# Patient Record
Sex: Female | Born: 1985 | Race: White | Hispanic: No | Marital: Single | Smoking: Never smoker
Health system: Southern US, Community
[De-identification: ages and names within clinical notes are randomized; demographics above are authoritative.]

## PROBLEM LIST (undated history)

## (undated) DIAGNOSIS — T783XXA Angioneurotic edema, initial encounter: Secondary | ICD-10-CM

## (undated) DIAGNOSIS — M5136 Other intervertebral disc degeneration, lumbar region: Secondary | ICD-10-CM

## (undated) DIAGNOSIS — R112 Nausea with vomiting, unspecified: Secondary | ICD-10-CM

## (undated) DIAGNOSIS — D8940 Mast cell activation, unspecified: Secondary | ICD-10-CM

## (undated) DIAGNOSIS — T7840XA Allergy, unspecified, initial encounter: Secondary | ICD-10-CM

## (undated) DIAGNOSIS — M5126 Other intervertebral disc displacement, lumbar region: Secondary | ICD-10-CM

## (undated) DIAGNOSIS — L509 Urticaria, unspecified: Secondary | ICD-10-CM

## (undated) DIAGNOSIS — Q796 Ehlers-Danlos syndrome, unspecified: Secondary | ICD-10-CM

## (undated) DIAGNOSIS — M51369 Other intervertebral disc degeneration, lumbar region without mention of lumbar back pain or lower extremity pain: Secondary | ICD-10-CM

## (undated) DIAGNOSIS — Z9889 Other specified postprocedural states: Secondary | ICD-10-CM

## (undated) HISTORY — DX: Angioneurotic edema, initial encounter: T78.3XXA

## (undated) HISTORY — DX: Ehlers-Danlos syndrome, unspecified: Q79.60

## (undated) HISTORY — DX: Urticaria, unspecified: L50.9

## (undated) HISTORY — DX: Allergy, unspecified, initial encounter: T78.40XA

## (undated) HISTORY — DX: Mast cell activation, unspecified: D89.40

---
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
## 2004-07-02 ENCOUNTER — Ambulatory Visit: Payer: Self-pay | Admitting: Gastroenterology

## 2011-07-15 HISTORY — PX: LEEP: SHX91

## 2015-02-14 ENCOUNTER — Other Ambulatory Visit: Payer: Self-pay | Admitting: Obstetrics & Gynecology

## 2015-02-14 ENCOUNTER — Other Ambulatory Visit (HOSPITAL_COMMUNITY)
Admission: RE | Admit: 2015-02-14 | Discharge: 2015-02-14 | Disposition: A | Discharge status: Home / Self Care | Payer: BLUE CROSS/BLUE SHIELD | Source: Ambulatory Visit | Attending: Obstetrics & Gynecology | Admitting: Obstetrics & Gynecology

## 2015-02-14 DIAGNOSIS — Z1151 Encounter for screening for human papillomavirus (HPV): Secondary | ICD-10-CM | POA: Diagnosis present

## 2015-02-14 DIAGNOSIS — Z01419 Encounter for gynecological examination (general) (routine) without abnormal findings: Secondary | ICD-10-CM | POA: Insufficient documentation

## 2015-02-14 DIAGNOSIS — Z113 Encounter for screening for infections with a predominantly sexual mode of transmission: Secondary | ICD-10-CM | POA: Diagnosis present

## 2015-02-15 LAB — CYTOLOGY - PAP

## 2015-11-16 DIAGNOSIS — Z113 Encounter for screening for infections with a predominantly sexual mode of transmission: Secondary | ICD-10-CM | POA: Diagnosis not present

## 2015-12-19 DIAGNOSIS — Z3202 Encounter for pregnancy test, result negative: Secondary | ICD-10-CM | POA: Diagnosis not present

## 2015-12-19 DIAGNOSIS — Z30430 Encounter for insertion of intrauterine contraceptive device: Secondary | ICD-10-CM | POA: Diagnosis not present

## 2016-02-04 DIAGNOSIS — Z30431 Encounter for routine checking of intrauterine contraceptive device: Secondary | ICD-10-CM | POA: Diagnosis not present

## 2016-03-10 DIAGNOSIS — H6121 Impacted cerumen, right ear: Secondary | ICD-10-CM | POA: Diagnosis not present

## 2016-03-10 DIAGNOSIS — H9311 Tinnitus, right ear: Secondary | ICD-10-CM | POA: Diagnosis not present

## 2016-04-21 DIAGNOSIS — H6993 Unspecified Eustachian tube disorder, bilateral: Secondary | ICD-10-CM | POA: Diagnosis not present

## 2016-04-21 DIAGNOSIS — R42 Dizziness and giddiness: Secondary | ICD-10-CM | POA: Diagnosis not present

## 2016-08-20 DIAGNOSIS — Z7189 Other specified counseling: Secondary | ICD-10-CM | POA: Diagnosis not present

## 2016-08-20 DIAGNOSIS — Z113 Encounter for screening for infections with a predominantly sexual mode of transmission: Secondary | ICD-10-CM | POA: Diagnosis not present

## 2017-02-12 DIAGNOSIS — M5136 Other intervertebral disc degeneration, lumbar region: Secondary | ICD-10-CM | POA: Diagnosis not present

## 2017-02-12 DIAGNOSIS — Z01411 Encounter for gynecological examination (general) (routine) with abnormal findings: Secondary | ICD-10-CM | POA: Diagnosis not present

## 2017-02-12 DIAGNOSIS — M545 Low back pain: Secondary | ICD-10-CM | POA: Diagnosis not present

## 2017-02-12 DIAGNOSIS — M5126 Other intervertebral disc displacement, lumbar region: Secondary | ICD-10-CM | POA: Diagnosis not present

## 2017-02-12 DIAGNOSIS — M5431 Sciatica, right side: Secondary | ICD-10-CM | POA: Diagnosis not present

## 2017-02-13 DIAGNOSIS — G5601 Carpal tunnel syndrome, right upper limb: Secondary | ICD-10-CM | POA: Diagnosis not present

## 2017-02-13 DIAGNOSIS — G5602 Carpal tunnel syndrome, left upper limb: Secondary | ICD-10-CM | POA: Diagnosis not present

## 2017-02-16 DIAGNOSIS — G5602 Carpal tunnel syndrome, left upper limb: Secondary | ICD-10-CM | POA: Diagnosis not present

## 2017-02-16 DIAGNOSIS — G5601 Carpal tunnel syndrome, right upper limb: Secondary | ICD-10-CM | POA: Diagnosis not present

## 2017-02-18 DIAGNOSIS — M5126 Other intervertebral disc displacement, lumbar region: Secondary | ICD-10-CM | POA: Diagnosis not present

## 2017-02-23 DIAGNOSIS — G5602 Carpal tunnel syndrome, left upper limb: Secondary | ICD-10-CM | POA: Diagnosis not present

## 2017-02-23 DIAGNOSIS — G5601 Carpal tunnel syndrome, right upper limb: Secondary | ICD-10-CM | POA: Diagnosis not present

## 2017-03-06 DIAGNOSIS — M545 Low back pain: Secondary | ICD-10-CM | POA: Diagnosis not present

## 2017-03-06 DIAGNOSIS — M5126 Other intervertebral disc displacement, lumbar region: Secondary | ICD-10-CM | POA: Diagnosis not present

## 2017-03-25 DIAGNOSIS — M5126 Other intervertebral disc displacement, lumbar region: Secondary | ICD-10-CM | POA: Diagnosis not present

## 2017-04-06 DIAGNOSIS — R1030 Lower abdominal pain, unspecified: Secondary | ICD-10-CM | POA: Diagnosis not present

## 2017-04-06 DIAGNOSIS — Z113 Encounter for screening for infections with a predominantly sexual mode of transmission: Secondary | ICD-10-CM | POA: Diagnosis not present

## 2017-04-06 DIAGNOSIS — Z975 Presence of (intrauterine) contraceptive device: Secondary | ICD-10-CM | POA: Diagnosis not present

## 2017-04-07 DIAGNOSIS — Z30431 Encounter for routine checking of intrauterine contraceptive device: Secondary | ICD-10-CM | POA: Diagnosis not present

## 2017-04-07 DIAGNOSIS — R102 Pelvic and perineal pain: Secondary | ICD-10-CM | POA: Diagnosis not present

## 2017-04-10 DIAGNOSIS — M5126 Other intervertebral disc displacement, lumbar region: Secondary | ICD-10-CM | POA: Diagnosis not present

## 2017-04-20 DIAGNOSIS — M5126 Other intervertebral disc displacement, lumbar region: Secondary | ICD-10-CM | POA: Diagnosis not present

## 2017-06-09 DIAGNOSIS — Z79899 Other long term (current) drug therapy: Secondary | ICD-10-CM | POA: Diagnosis not present

## 2017-06-09 DIAGNOSIS — L700 Acne vulgaris: Secondary | ICD-10-CM | POA: Diagnosis not present

## 2017-06-09 DIAGNOSIS — L409 Psoriasis, unspecified: Secondary | ICD-10-CM | POA: Diagnosis not present

## 2017-07-15 DIAGNOSIS — L700 Acne vulgaris: Secondary | ICD-10-CM | POA: Diagnosis not present

## 2017-07-15 DIAGNOSIS — Z79899 Other long term (current) drug therapy: Secondary | ICD-10-CM | POA: Diagnosis not present

## 2017-07-20 DIAGNOSIS — Z113 Encounter for screening for infections with a predominantly sexual mode of transmission: Secondary | ICD-10-CM | POA: Diagnosis not present

## 2017-08-17 DIAGNOSIS — L700 Acne vulgaris: Secondary | ICD-10-CM | POA: Diagnosis not present

## 2017-08-17 DIAGNOSIS — Z79899 Other long term (current) drug therapy: Secondary | ICD-10-CM | POA: Diagnosis not present

## 2017-09-04 DIAGNOSIS — M545 Low back pain: Secondary | ICD-10-CM | POA: Diagnosis not present

## 2017-09-11 DIAGNOSIS — M545 Low back pain: Secondary | ICD-10-CM | POA: Diagnosis not present

## 2017-10-02 DIAGNOSIS — M545 Low back pain: Secondary | ICD-10-CM | POA: Diagnosis not present

## 2017-10-16 DIAGNOSIS — T8389XA Other specified complication of genitourinary prosthetic devices, implants and grafts, initial encounter: Secondary | ICD-10-CM | POA: Diagnosis not present

## 2017-10-16 DIAGNOSIS — Z30432 Encounter for removal of intrauterine contraceptive device: Secondary | ICD-10-CM | POA: Diagnosis not present

## 2017-10-16 DIAGNOSIS — R102 Pelvic and perineal pain: Secondary | ICD-10-CM | POA: Diagnosis not present

## 2017-10-16 DIAGNOSIS — Z975 Presence of (intrauterine) contraceptive device: Secondary | ICD-10-CM | POA: Diagnosis not present

## 2017-10-16 DIAGNOSIS — N760 Acute vaginitis: Secondary | ICD-10-CM | POA: Diagnosis not present

## 2017-10-23 DIAGNOSIS — Z30432 Encounter for removal of intrauterine contraceptive device: Secondary | ICD-10-CM | POA: Diagnosis not present

## 2017-10-27 ENCOUNTER — Encounter (HOSPITAL_COMMUNITY): Payer: Self-pay | Admitting: *Deleted

## 2017-10-27 ENCOUNTER — Other Ambulatory Visit: Payer: Self-pay

## 2017-11-06 ENCOUNTER — Ambulatory Visit (HOSPITAL_COMMUNITY): Payer: BLUE CROSS/BLUE SHIELD | Admitting: Anesthesiology

## 2017-11-06 ENCOUNTER — Ambulatory Visit (HOSPITAL_COMMUNITY)
Admission: RE | Admit: 2017-11-06 | Discharge: 2017-11-06 | Disposition: A | Discharge status: Home / Self Care | Payer: BLUE CROSS/BLUE SHIELD | Source: Ambulatory Visit | Attending: Obstetrics & Gynecology | Admitting: Obstetrics & Gynecology

## 2017-11-06 ENCOUNTER — Encounter (HOSPITAL_COMMUNITY)
Admission: RE | Disposition: A | Discharge status: Home / Self Care | Payer: Self-pay | Source: Ambulatory Visit | Attending: Obstetrics & Gynecology

## 2017-11-06 ENCOUNTER — Encounter (HOSPITAL_COMMUNITY): Payer: Self-pay

## 2017-11-06 ENCOUNTER — Other Ambulatory Visit: Payer: Self-pay

## 2017-11-06 DIAGNOSIS — M542 Cervicalgia: Secondary | ICD-10-CM | POA: Diagnosis not present

## 2017-11-06 DIAGNOSIS — Y768 Miscellaneous obstetric and gynecological devices associated with adverse incidents, not elsewhere classified: Secondary | ICD-10-CM | POA: Diagnosis not present

## 2017-11-06 DIAGNOSIS — T8389XD Other specified complication of genitourinary prosthetic devices, implants and grafts, subsequent encounter: Secondary | ICD-10-CM | POA: Diagnosis not present

## 2017-11-06 DIAGNOSIS — T8389XA Other specified complication of genitourinary prosthetic devices, implants and grafts, initial encounter: Secondary | ICD-10-CM | POA: Insufficient documentation

## 2017-11-06 DIAGNOSIS — Z30432 Encounter for removal of intrauterine contraceptive device: Secondary | ICD-10-CM | POA: Diagnosis not present

## 2017-11-06 HISTORY — DX: Other intervertebral disc degeneration, lumbar region: M51.36

## 2017-11-06 HISTORY — DX: Other specified postprocedural states: R11.2

## 2017-11-06 HISTORY — PX: HYSTEROSCOPY: SHX211

## 2017-11-06 HISTORY — DX: Other specified postprocedural states: Z98.890

## 2017-11-06 HISTORY — DX: Other intervertebral disc displacement, lumbar region: M51.26

## 2017-11-06 HISTORY — DX: Other intervertebral disc degeneration, lumbar region without mention of lumbar back pain or lower extremity pain: M51.369

## 2017-11-06 HISTORY — DX: Nausea with vomiting, unspecified: R11.2

## 2017-11-06 HISTORY — PX: IUD REMOVAL: SHX5392

## 2017-11-06 LAB — CBC
HEMATOCRIT: 42.8 % (ref 36.0–46.0)
Hemoglobin: 14.7 g/dL (ref 12.0–15.0)
MCH: 31.8 pg (ref 26.0–34.0)
MCHC: 34.3 g/dL (ref 30.0–36.0)
MCV: 92.6 fL (ref 78.0–100.0)
Platelets: 278 10*3/uL (ref 150–400)
RBC: 4.62 MIL/uL (ref 3.87–5.11)
RDW: 13 % (ref 11.5–15.5)
WBC: 8 10*3/uL (ref 4.0–10.5)

## 2017-11-06 LAB — COMPREHENSIVE METABOLIC PANEL
ALT: 15 U/L (ref 14–54)
ANION GAP: 11 (ref 5–15)
AST: 21 U/L (ref 15–41)
Albumin: 4.5 g/dL (ref 3.5–5.0)
Alkaline Phosphatase: 50 U/L (ref 38–126)
BILIRUBIN TOTAL: 1.7 mg/dL — AB (ref 0.3–1.2)
BUN: 9 mg/dL (ref 6–20)
CO2: 23 mmol/L (ref 22–32)
Calcium: 9.2 mg/dL (ref 8.9–10.3)
Chloride: 107 mmol/L (ref 101–111)
Creatinine, Ser: 0.58 mg/dL (ref 0.44–1.00)
GFR calc Af Amer: >60 mL/min (ref >60)
Glucose, Bld: 97 mg/dL (ref 65–99)
POTASSIUM: 4.4 mmol/L (ref 3.5–5.1)
Sodium: 141 mmol/L (ref 135–145)
TOTAL PROTEIN: 7.6 g/dL (ref 6.5–8.1)

## 2017-11-06 LAB — PREGNANCY, URINE: Preg Test, Ur: NEGATIVE

## 2017-11-06 SURGERY — HYSTEROSCOPY
Anesthesia: General

## 2017-11-06 MED ORDER — PROPOFOL 10 MG/ML IV BOLUS
INTRAVENOUS | Status: DC | PRN
  Administered 2017-11-06: 170 mg via INTRAVENOUS

## 2017-11-06 MED ORDER — ONDANSETRON HCL 4 MG/2ML IJ SOLN
INTRAMUSCULAR | Status: AC
  Filled 2017-11-06: qty 2

## 2017-11-06 MED ORDER — DEXAMETHASONE SODIUM PHOSPHATE 10 MG/ML IJ SOLN
INTRAMUSCULAR | Status: DC | PRN
  Administered 2017-11-06: 10 mg via INTRAVENOUS

## 2017-11-06 MED ORDER — SCOPOLAMINE 1 MG/3DAYS TD PT72
1.0000 | MEDICATED_PATCH | Freq: Once | TRANSDERMAL | Status: DC
  Administered 2017-11-06: 1.5 mg via TRANSDERMAL

## 2017-11-06 MED ORDER — FENTANYL CITRATE (PF) 100 MCG/2ML IJ SOLN
INTRAMUSCULAR | Status: AC
Start: 2017-11-06 — End: ?
  Filled 2017-11-06: qty 2

## 2017-11-06 MED ORDER — ONDANSETRON HCL 4 MG/2ML IJ SOLN
INTRAMUSCULAR | Status: DC | PRN
  Administered 2017-11-06: 4 mg via INTRAVENOUS

## 2017-11-06 MED ORDER — LIDOCAINE 2% (20 MG/ML) 5 ML SYRINGE
INTRAMUSCULAR | Status: DC | PRN
  Administered 2017-11-06: 60 mg via INTRAVENOUS

## 2017-11-06 MED ORDER — PROPOFOL 10 MG/ML IV BOLUS
INTRAVENOUS | Status: AC
  Filled 2017-11-06: qty 20

## 2017-11-06 MED ORDER — ACETAMINOPHEN 160 MG/5ML PO SOLN
ORAL | Status: AC
  Filled 2017-11-06: qty 40.6

## 2017-11-06 MED ORDER — DEXAMETHASONE SODIUM PHOSPHATE 10 MG/ML IJ SOLN
INTRAMUSCULAR | Status: AC
Start: 2017-11-06 — End: ?
  Filled 2017-11-06: qty 1

## 2017-11-06 MED ORDER — SCOPOLAMINE 1 MG/3DAYS TD PT72
MEDICATED_PATCH | TRANSDERMAL | Status: AC
  Filled 2017-11-06: qty 1

## 2017-11-06 MED ORDER — FENTANYL CITRATE (PF) 100 MCG/2ML IJ SOLN: 25.0000 ug | INTRAMUSCULAR | Status: DC | PRN

## 2017-11-06 MED ORDER — LACTATED RINGERS IV SOLN: INTRAVENOUS | Status: DC

## 2017-11-06 MED ORDER — MIDAZOLAM HCL 2 MG/2ML IJ SOLN
INTRAMUSCULAR | Status: AC
  Filled 2017-11-06: qty 2

## 2017-11-06 MED ORDER — DEXAMETHASONE SODIUM PHOSPHATE 10 MG/ML IJ SOLN
INTRAMUSCULAR | Status: AC
  Filled 2017-11-06: qty 2

## 2017-11-06 MED ORDER — KETOROLAC TROMETHAMINE 30 MG/ML IJ SOLN
INTRAMUSCULAR | Status: DC | PRN
  Administered 2017-11-06: 30 mg via INTRAVENOUS

## 2017-11-06 MED ORDER — ACETAMINOPHEN 160 MG/5ML PO SOLN
975.0000 mg | Freq: Once | ORAL | Status: AC
  Administered 2017-11-06: 975 mg via ORAL

## 2017-11-06 MED ORDER — LIDOCAINE HCL (CARDIAC) PF 100 MG/5ML IV SOSY
PREFILLED_SYRINGE | INTRAVENOUS | Status: AC
  Filled 2017-11-06: qty 5

## 2017-11-06 MED ORDER — MIDAZOLAM HCL 5 MG/5ML IJ SOLN
INTRAMUSCULAR | Status: DC | PRN
  Administered 2017-11-06: 2 mg via INTRAVENOUS

## 2017-11-06 MED ORDER — LACTATED RINGERS IV SOLN
INTRAVENOUS | Status: DC
  Administered 2017-11-06: 125 mL/h via INTRAVENOUS

## 2017-11-06 MED ORDER — FENTANYL CITRATE (PF) 100 MCG/2ML IJ SOLN
INTRAMUSCULAR | Status: DC | PRN
  Administered 2017-11-06 (×2): 50 ug via INTRAVENOUS

## 2017-11-06 MED ORDER — ROCURONIUM BROMIDE 100 MG/10ML IV SOLN
INTRAVENOUS | Status: AC
  Filled 2017-11-06: qty 2

## 2017-11-06 MED ORDER — KETOROLAC TROMETHAMINE 30 MG/ML IJ SOLN
INTRAMUSCULAR | Status: AC
  Filled 2017-11-06: qty 1

## 2017-11-06 SURGICAL SUPPLY — 17 items
BRIEF STRETCH FOR OB PAD XXL (UNDERPADS AND DIAPERS) ×2 IMPLANT
CANISTER AQUILEX FLUID KIT (CANNISTER) ×2 IMPLANT
CATH ROBINSON RED A/P 16FR (CATHETERS) IMPLANT
DILATOR CANAL MILEX (MISCELLANEOUS) IMPLANT
GLOVE BIOGEL PI IND STRL 6.5 (GLOVE) ×1 IMPLANT
GLOVE BIOGEL PI IND STRL 7.0 (GLOVE) ×1 IMPLANT
GLOVE BIOGEL PI INDICATOR 6.5 (GLOVE) ×1
GLOVE BIOGEL PI INDICATOR 7.0 (GLOVE) ×1
GLOVE ECLIPSE 6.5 STRL STRAW (GLOVE) ×2 IMPLANT
GOWN STRL REUS W/TWL LRG LVL3 (GOWN DISPOSABLE) ×4 IMPLANT
PACK VAGINAL MINOR WOMEN LF (CUSTOM PROCEDURE TRAY) ×2 IMPLANT
PAD OB MATERNITY 4.3X12.25 (PERSONAL CARE ITEMS) ×2 IMPLANT
PAD PREP 24X48 CUFFED NSTRL (MISCELLANEOUS) ×2 IMPLANT
SLEEVE SCD COMPRESS KNEE MED (MISCELLANEOUS) ×2 IMPLANT
TOWEL OR 17X24 6PK STRL BLUE (TOWEL DISPOSABLE) ×4 IMPLANT
TUBING AQUILEX INFLOW (TUBING) ×2 IMPLANT
TUBING AQUILEX OUTFLOW (TUBING) ×2 IMPLANT

## 2017-11-06 NOTE — Anesthesia Preprocedure Evaluation (Addendum)
Anesthesia Evaluation  Patient identified by MRN, date of birth, ID band Patient awake    Reviewed: Allergy & Precautions, H&P , Patient's Chart, lab work & pertinent test results, reviewed documented beta blocker date and time   Airway Mallampati: II  TM Distance: >3 FB Neck ROM: full    Dental no notable dental hx.    Pulmonary    Pulmonary exam normal breath sounds clear to auscultation       Cardiovascular  Rhythm:regular Rate:Normal     Neuro/Psych    GI/Hepatic   Endo/Other    Renal/GU      Musculoskeletal   Abdominal   Peds  Hematology   Anesthesia Other Findings   Reproductive/Obstetrics                             Anesthesia Physical Anesthesia Plan  ASA: II  Anesthesia Plan: General   Post-op Pain Management:    Induction: Intravenous  PONV Risk Score and Plan: 2 and Treatment may vary due to age or medical condition, Ondansetron, Dexamethasone and Scopolamine patch - Pre-op  Airway Management Planned: LMA  Additional Equipment:   Intra-op Plan:   Post-operative Plan:   Informed Consent: I have reviewed the patients History and Physical, chart, labs and discussed the procedure including the risks, benefits and alternatives for the proposed anesthesia with the patient or authorized representative who has indicated his/her understanding and acceptance.   Dental Advisory Given  Plan Discussed with: CRNA and Surgeon  Anesthesia Plan Comments: ( )        Anesthesia Quick Evaluation

## 2017-11-06 NOTE — Anesthesia Postprocedure Evaluation (Signed)
Anesthesia Post Note  Patient: Betty Lewis  Procedure(s) Performed: HYSTEROSCOPY (N/A ) INTRAUTERINE DEVICE (IUD) REMOVAL (N/A )     Patient location during evaluation: PACU Anesthesia Type: General Level of consciousness: awake and alert Pain management: pain level controlled Vital Signs Assessment: post-procedure vital signs reviewed and stable Respiratory status: spontaneous breathing, nonlabored ventilation, respiratory function stable and patient connected to nasal cannula oxygen Cardiovascular status: blood pressure returned to baseline and stable Postop Assessment: no apparent nausea or vomiting Anesthetic complications: no    Last Vitals:  Vitals:   11/06/17 1330 11/06/17 1415  BP: (!) 99/51 (!) 107/55  Pulse: 64 61  Resp: (!) 21 18  Temp:    SpO2: 99% 100%    Last Pain:  Vitals:   11/06/17 1415  TempSrc:   PainSc: 0-No pain   Pain Goal: Patients Stated Pain Goal: 5 (11/06/17 1107)               Lyndle Herrlich EDWARD

## 2017-11-06 NOTE — Transfer of Care (Signed)
Immediate Anesthesia Transfer of Care Note  Patient: Betty Lewis  Procedure(s) Performed: HYSTEROSCOPY (N/A ) INTRAUTERINE DEVICE (IUD) REMOVAL (N/A )  Patient Location: PACU  Anesthesia Type:General  Level of Consciousness: awake, alert  and oriented  Airway & Oxygen Therapy: Patient Spontanous Breathing and Patient connected to nasal cannula oxygen  Post-op Assessment: Report given to RN and Post -op Vital signs reviewed and stable  Post vital signs: Reviewed and stable  Last Vitals:  Vitals Value Taken Time  BP    Temp    Pulse 95 11/06/2017 12:52 PM  Resp 20 11/06/2017 12:52 PM  SpO2 99 % 11/06/2017 12:52 PM  Vitals shown include unvalidated device data.  Last Pain:  Vitals:   11/06/17 1107  TempSrc: Oral  PainSc: 7       Patients Stated Pain Goal: 5 (63/78/58 8502)  Complications: No apparent anesthesia complications

## 2017-11-06 NOTE — Interval H&P Note (Signed)
History and Physical Interval Note:  11/06/2017 12:11 PM  Betty Lewis  has presented today for surgery, with the diagnosis of Z30.432 IUD Removal  The various methods of treatment have been discussed with the patient and family. After consideration of risks, benefits and other options for treatment, the patient has consented to  Procedure(s): HYSTEROSCOPY (N/A) INTRAUTERINE DEVICE (IUD) REMOVAL (N/A) as a surgical intervention .  The patient's history has been reviewed, patient examined, no change in status, stable for surgery.  I have reviewed the patient's chart and labs.  Questions were answered to the patient's satisfaction.     Annalee Genta

## 2017-11-06 NOTE — Anesthesia Procedure Notes (Signed)
Procedure Name: LMA Insertion Date/Time: 11/06/2017 12:26 PM Performed by: Talbot Grumbling, CRNA Pre-anesthesia Checklist: Patient identified, Emergency Drugs available, Suction available and Patient being monitored Patient Re-evaluated:Patient Re-evaluated prior to induction Oxygen Delivery Method: Circle system utilized Preoxygenation: Pre-oxygenation with 100% oxygen Induction Type: IV induction Ventilation: Mask ventilation without difficulty LMA: LMA inserted LMA Size: 4.0 Number of attempts: 1 Placement Confirmation: positive ETCO2 and breath sounds checked- equal and bilateral Tube secured with: Tape Dental Injury: Teeth and Oropharynx as per pre-operative assessment

## 2017-11-06 NOTE — H&P (Signed)
32yo G0 who presents for IUD removal by hysteroscopy.  Pt was seen in office removal was attempted under US guidance, but could not be completed.   In review, over the past year she has noted several side effects including frequent yeast infections (4 this past year), pelvic pain, worsening of skin and other changes. She was seen for this issue by Dr. Clayton Bibles in Sept and the [place] confirmed proper location. While she loves not having a period, due to these other concerns, she wishes to have it removed. Device initially placed on 12/19/15.        Medical History:         Gyn History:  Sexual activity currently sexually active.  Periods : irregular.  Birth control Mirena IUD.  Last pap smear date 02/14/15 - WNL negative HPV.  Denies H/O Last mammogram date.  Abnormal pap smear assessed with colposcopy, treated with LEEP.  STD HPV.        OB History:  Never been pregnant per patient.        Surgical History: Tonsillectomy 2005, LEEP 2014.        Family History: Father: alive. Mother: alive, diagnosed with Diabetes, Hypertension. Paternal Grand Mother: Breast cancer.        Social History:  General:  Tobacco use  cigarettes: Never smoked Tobacco history last updated 10/16/2017 no EXPOSURE TO PASSIVE SMOKE.  Alcohol: yes, social.  no Recreational drug use.  Exercise: yes, regularly.  Marital Status: single.  Children: none.  OCCUPATION: employed, Orthoptist.        Medications: Taking Mirena (52 MG)(Levonorgestrel) 20 MCG/24HR Intrauterine Device Intrauterine , Taking Ibuprofen 600 MG Tablet 1 tablet Orally every 6 hrs x 24 hours prior to menses then q 6 hours prn pain, Discontinued Cytotec(MiSOPROStol) 200 MCG Tablet 1 tablet vaginal night before and in the morning of appointment, Medication List reviewed and reconciled with the patient       Allergies: Codeine Sulfate.       Objective:     Examination:  General Examination:  CONSTITUTIONAL: well developed, well  nourished.  SKIN: warm and dry, no rashes.  NECK: supple, normal appearance.  LUNGS: regular breathing rate and effort.  FEMALE GENITOURINARY: normal external genitalia, labia - unremarkable, vagina - pink moist mucosa, no lesions or abnormal discharge, cervix - no discharge or lesions or CMT.  MUSCULOSKELETAL no calf tenderness bilaterally.  EXTREMITIES: no edema present.  PSYCH: appropriate mood and affect.     A/P: 32yo G0 who presents for hysteroscopy and IUD removal -NPO -LR @ 125cc/hr -SCDs to OR -Risk/benefit reviewed with patient including but not limited to risk of bleeding, infection, uterine perforation or inability to remove device.  Pt aware and wishes to proceed  Janyth Pupa, DO [phone] (cell) [phone] (office)

## 2017-11-06 NOTE — Discharge Instructions (Addendum)
DISCHARGE INSTRUCTIONS: D&C / D&E The following instructions have been prepared to help you care for yourself upon your return home.   Personal hygiene:  Use sanitary pads for vaginal drainage, not tampons.  Shower the day after your procedure.  NO tub baths, pools or Jacuzzis for 2-3 weeks.  Wipe front to back after using the bathroom.  Activity and limitations:  Do NOT drive or operate any equipment for 24 hours. The effects of anesthesia are still present and drowsiness may result.  Do NOT rest in bed all day.  Walking is encouraged.  Walk up and down stairs slowly.  You may resume your normal activity in one to two days or as indicated by your physician.  Sexual activity: NO intercourse for at least 2 weeks after the procedure, or as indicated by your physician.  Diet: Eat a light meal as desired this evening. You may resume your usual diet tomorrow.  Return to work: You may resume your work activities in one to two days or as indicated by your doctor.  What to expect after your surgery: Expect to have vaginal bleeding/discharge for 2-3 days and spotting for up to 10 days. It is not unusual to have soreness for up to 1-2 weeks. You may have a slight burning sensation when you urinate for the first day. Mild cramps may continue for a couple of days. You may have a regular period in 2-6 weeks.  Call your doctor for any of the following:  Excessive vaginal bleeding, saturating and changing one pad every hour.  Inability to urinate 6 hours after discharge from hospital.  Pain not relieved by pain medication.  Fever of 100.4 F or greater.  Unusual vaginal discharge or odor.   Call for an appointment:    Patients signature: ______________________  Nurses signature ________________________  Support person's signature_______________________   HOME INSTRUCTIONS  Please note any unusual or excessive bleeding, pain, swelling. Mild dizziness or drowsiness are  normal for about 24 hours after surgery.   Shower when comfortable  Restrictions: No driving for 24 hours or while taking pain medications.  Activity:  No heavy lifting (> 10 lbs), nothing in vagina (no tampons, douching, or intercourse) x 1 weeks; no tub baths for 1 weeks Vaginal spotting is expected but if your bleeding is heavy, period like,  please call the office   Diet:  You may return to your regular diet.  Do not eat large meals.  Eat small frequent meals throughout the day.  Continue to drink a good amount of water at least 6-8 glasses of water per day, hydration is very important for the healing process.  Pain Management: Take Motrin and/or tylenol as prescribed/needed for pain.  Always take prescription pain medication with food, it may cause constipation, increase fluids and fiber and you may want to take an over-the-counter stool softener like Colace as needed up to 2x a day.    Alcohol -- Avoid for 24 hours and while taking pain medications.  Nausea: Take sips of ginger ale or soda  Fever -- Call physician if temperature over 101 degrees  Follow up:  If you do not already have a follow up appointment scheduled, please call the office at [phone].  If you experience fever (a temperature greater than 100.4), pain unrelieved by pain medication, shortness of breath, swelling of a single leg, or any other symptoms which are concerning to you please the office immediately.

## 2017-11-06 NOTE — Op Note (Signed)
Operative Report  PreOp: IUD migration PostOp: same Procedure:  Hysteroscopy, IUD removal Surgeon: Dr. Janyth Pupa Anesthesia: General Complications:none EBL: Minimal  Findings:7cm anteverted uterus, both ostia visualized.  IUD in proper location Specimens: none  Procedure: The patient was taken to the operating room where she underwent general anesthesia without difficulty. The patient was placed in a low lithotomy position using Allen stirrups. The patient was examined with the findings as noted above.  She was then prepped and draped in the normal sterile fashion. A sterile speculum was inserted into the vagina. A single tooth tenaculum was placed on the anterior lip of the cervix. The uterus was then sounded to 7cm. The endocervical canal was then serially dilated to 14French using Hank dilators.  The diagnostic hysteroscope was then inserted without difficulty and noted to have the findings as listed above. The strings were visualized and the device was removed without complications.  The hysteroscope was reinserted, no uterine perforation, no abnormalities seen. Hemostasis was observed at the cervical site.  The patient was repositioned to the supine position. The patient tolerated the procedure without any complications and taken to recovery in stable condition.   Janyth Pupa, DO [phone] (pager) [phone] (office)

## 2017-11-07 ENCOUNTER — Encounter (HOSPITAL_COMMUNITY): Payer: Self-pay | Admitting: Obstetrics & Gynecology

## 2017-11-25 DIAGNOSIS — M542 Cervicalgia: Secondary | ICD-10-CM | POA: Diagnosis not present

## 2017-11-25 DIAGNOSIS — M5412 Radiculopathy, cervical region: Secondary | ICD-10-CM | POA: Diagnosis not present

## 2017-11-25 DIAGNOSIS — M545 Low back pain: Secondary | ICD-10-CM | POA: Diagnosis not present

## 2018-01-25 DIAGNOSIS — B373 Candidiasis of vulva and vagina: Secondary | ICD-10-CM | POA: Diagnosis not present

## 2018-01-25 DIAGNOSIS — N760 Acute vaginitis: Secondary | ICD-10-CM | POA: Diagnosis not present

## 2018-02-01 DIAGNOSIS — L409 Psoriasis, unspecified: Secondary | ICD-10-CM | POA: Diagnosis not present

## 2018-02-04 DIAGNOSIS — M5412 Radiculopathy, cervical region: Secondary | ICD-10-CM | POA: Insufficient documentation

## 2018-02-16 DIAGNOSIS — Z01411 Encounter for gynecological examination (general) (routine) with abnormal findings: Secondary | ICD-10-CM | POA: Diagnosis not present

## 2018-03-11 DIAGNOSIS — M25552 Pain in left hip: Secondary | ICD-10-CM | POA: Diagnosis not present

## 2018-03-11 DIAGNOSIS — M5416 Radiculopathy, lumbar region: Secondary | ICD-10-CM | POA: Diagnosis not present

## 2018-03-25 DIAGNOSIS — M5416 Radiculopathy, lumbar region: Secondary | ICD-10-CM | POA: Diagnosis not present

## 2018-03-31 DIAGNOSIS — M5416 Radiculopathy, lumbar region: Secondary | ICD-10-CM | POA: Diagnosis not present

## 2018-04-09 DIAGNOSIS — M5416 Radiculopathy, lumbar region: Secondary | ICD-10-CM | POA: Diagnosis not present

## 2018-04-21 DIAGNOSIS — M5416 Radiculopathy, lumbar region: Secondary | ICD-10-CM | POA: Diagnosis not present

## 2018-04-29 DIAGNOSIS — L408 Other psoriasis: Secondary | ICD-10-CM | POA: Diagnosis not present

## 2018-04-29 DIAGNOSIS — Z23 Encounter for immunization: Secondary | ICD-10-CM | POA: Diagnosis not present

## 2018-04-29 DIAGNOSIS — Z411 Encounter for cosmetic surgery: Secondary | ICD-10-CM | POA: Diagnosis not present

## 2018-05-07 DIAGNOSIS — M5416 Radiculopathy, lumbar region: Secondary | ICD-10-CM | POA: Diagnosis not present

## 2018-05-07 DIAGNOSIS — M545 Low back pain: Secondary | ICD-10-CM | POA: Diagnosis not present

## 2018-06-18 DIAGNOSIS — N760 Acute vaginitis: Secondary | ICD-10-CM | POA: Diagnosis not present

## 2018-06-18 DIAGNOSIS — R300 Dysuria: Secondary | ICD-10-CM | POA: Diagnosis not present

## 2018-08-11 DIAGNOSIS — M25511 Pain in right shoulder: Secondary | ICD-10-CM | POA: Insufficient documentation

## 2018-08-13 DIAGNOSIS — F4322 Adjustment disorder with anxiety: Secondary | ICD-10-CM | POA: Diagnosis not present

## 2018-08-31 DIAGNOSIS — M2550 Pain in unspecified joint: Secondary | ICD-10-CM | POA: Diagnosis not present

## 2018-08-31 DIAGNOSIS — R634 Abnormal weight loss: Secondary | ICD-10-CM | POA: Diagnosis not present

## 2018-09-04 DIAGNOSIS — F4322 Adjustment disorder with anxiety: Secondary | ICD-10-CM | POA: Diagnosis not present

## 2018-09-07 DIAGNOSIS — M7062 Trochanteric bursitis, left hip: Secondary | ICD-10-CM | POA: Diagnosis not present

## 2018-09-17 DIAGNOSIS — F4322 Adjustment disorder with anxiety: Secondary | ICD-10-CM | POA: Diagnosis not present

## 2018-10-02 DIAGNOSIS — F4322 Adjustment disorder with anxiety: Secondary | ICD-10-CM | POA: Diagnosis not present

## 2018-10-15 DIAGNOSIS — F4322 Adjustment disorder with anxiety: Secondary | ICD-10-CM | POA: Diagnosis not present

## 2018-10-19 DIAGNOSIS — M7062 Trochanteric bursitis, left hip: Secondary | ICD-10-CM | POA: Diagnosis not present

## 2018-10-19 DIAGNOSIS — M25511 Pain in right shoulder: Secondary | ICD-10-CM | POA: Diagnosis not present

## 2018-11-02 DIAGNOSIS — M25511 Pain in right shoulder: Secondary | ICD-10-CM | POA: Diagnosis not present

## 2018-11-02 DIAGNOSIS — M25552 Pain in left hip: Secondary | ICD-10-CM | POA: Diagnosis not present

## 2018-11-04 DIAGNOSIS — M7062 Trochanteric bursitis, left hip: Secondary | ICD-10-CM | POA: Insufficient documentation

## 2018-11-04 DIAGNOSIS — F4322 Adjustment disorder with anxiety: Secondary | ICD-10-CM | POA: Diagnosis not present

## 2018-11-05 DIAGNOSIS — S43431A Superior glenoid labrum lesion of right shoulder, initial encounter: Secondary | ICD-10-CM | POA: Diagnosis not present

## 2018-11-05 DIAGNOSIS — M24159 Other articular cartilage disorders, unspecified hip: Secondary | ICD-10-CM | POA: Diagnosis not present

## 2018-11-05 DIAGNOSIS — M25511 Pain in right shoulder: Secondary | ICD-10-CM | POA: Diagnosis not present

## 2018-11-05 DIAGNOSIS — M7062 Trochanteric bursitis, left hip: Secondary | ICD-10-CM | POA: Diagnosis not present

## 2018-11-16 DIAGNOSIS — M25552 Pain in left hip: Secondary | ICD-10-CM | POA: Insufficient documentation

## 2018-11-25 DIAGNOSIS — F4322 Adjustment disorder with anxiety: Secondary | ICD-10-CM | POA: Diagnosis not present

## 2018-12-13 DIAGNOSIS — L409 Psoriasis, unspecified: Secondary | ICD-10-CM | POA: Diagnosis not present

## 2018-12-16 DIAGNOSIS — F4322 Adjustment disorder with anxiety: Secondary | ICD-10-CM | POA: Diagnosis not present

## 2018-12-17 DIAGNOSIS — M25552 Pain in left hip: Secondary | ICD-10-CM | POA: Diagnosis not present

## 2018-12-17 DIAGNOSIS — S43431A Superior glenoid labrum lesion of right shoulder, initial encounter: Secondary | ICD-10-CM | POA: Diagnosis not present

## 2018-12-17 DIAGNOSIS — M7062 Trochanteric bursitis, left hip: Secondary | ICD-10-CM | POA: Diagnosis not present

## 2018-12-27 DIAGNOSIS — M25511 Pain in right shoulder: Secondary | ICD-10-CM | POA: Diagnosis not present

## 2018-12-27 DIAGNOSIS — M25552 Pain in left hip: Secondary | ICD-10-CM | POA: Diagnosis not present

## 2019-01-06 DIAGNOSIS — F4322 Adjustment disorder with anxiety: Secondary | ICD-10-CM | POA: Diagnosis not present

## 2019-01-27 DIAGNOSIS — F4322 Adjustment disorder with anxiety: Secondary | ICD-10-CM | POA: Diagnosis not present

## 2019-02-04 DIAGNOSIS — M25511 Pain in right shoulder: Secondary | ICD-10-CM | POA: Diagnosis not present

## 2019-02-11 DIAGNOSIS — M25511 Pain in right shoulder: Secondary | ICD-10-CM | POA: Diagnosis not present

## 2019-03-01 DIAGNOSIS — L659 Nonscarring hair loss, unspecified: Secondary | ICD-10-CM | POA: Diagnosis not present

## 2019-03-01 DIAGNOSIS — M25552 Pain in left hip: Secondary | ICD-10-CM | POA: Diagnosis not present

## 2019-03-01 DIAGNOSIS — Z01411 Encounter for gynecological examination (general) (routine) with abnormal findings: Secondary | ICD-10-CM | POA: Diagnosis not present

## 2019-03-01 DIAGNOSIS — M25511 Pain in right shoulder: Secondary | ICD-10-CM | POA: Diagnosis not present

## 2019-03-01 DIAGNOSIS — N939 Abnormal uterine and vaginal bleeding, unspecified: Secondary | ICD-10-CM | POA: Diagnosis not present

## 2019-03-09 DIAGNOSIS — M25511 Pain in right shoulder: Secondary | ICD-10-CM | POA: Diagnosis not present

## 2019-03-09 DIAGNOSIS — Z1159 Encounter for screening for other viral diseases: Secondary | ICD-10-CM | POA: Diagnosis not present

## 2019-03-09 DIAGNOSIS — M25552 Pain in left hip: Secondary | ICD-10-CM | POA: Diagnosis not present

## 2019-03-09 DIAGNOSIS — Z79899 Other long term (current) drug therapy: Secondary | ICD-10-CM | POA: Diagnosis not present

## 2019-03-09 DIAGNOSIS — M129 Arthropathy, unspecified: Secondary | ICD-10-CM | POA: Diagnosis not present

## 2019-03-09 DIAGNOSIS — M545 Low back pain: Secondary | ICD-10-CM | POA: Diagnosis not present

## 2019-03-09 DIAGNOSIS — N915 Oligomenorrhea, unspecified: Secondary | ICD-10-CM | POA: Diagnosis not present

## 2019-03-15 DIAGNOSIS — L650 Telogen effluvium: Secondary | ICD-10-CM | POA: Diagnosis not present

## 2019-03-15 DIAGNOSIS — L239 Allergic contact dermatitis, unspecified cause: Secondary | ICD-10-CM | POA: Diagnosis not present

## 2019-03-16 DIAGNOSIS — M25511 Pain in right shoulder: Secondary | ICD-10-CM | POA: Diagnosis not present

## 2019-03-16 DIAGNOSIS — M545 Low back pain: Secondary | ICD-10-CM | POA: Diagnosis not present

## 2019-03-16 DIAGNOSIS — G8929 Other chronic pain: Secondary | ICD-10-CM | POA: Diagnosis not present

## 2019-03-16 DIAGNOSIS — M25552 Pain in left hip: Secondary | ICD-10-CM | POA: Diagnosis not present

## 2019-04-01 DIAGNOSIS — F4323 Adjustment disorder with mixed anxiety and depressed mood: Secondary | ICD-10-CM | POA: Diagnosis not present

## 2019-04-01 DIAGNOSIS — H6121 Impacted cerumen, right ear: Secondary | ICD-10-CM | POA: Diagnosis not present

## 2019-04-07 DIAGNOSIS — F4323 Adjustment disorder with mixed anxiety and depressed mood: Secondary | ICD-10-CM | POA: Diagnosis not present

## 2019-04-08 DIAGNOSIS — Z20828 Contact with and (suspected) exposure to other viral communicable diseases: Secondary | ICD-10-CM | POA: Diagnosis not present

## 2019-04-08 DIAGNOSIS — Z7189 Other specified counseling: Secondary | ICD-10-CM | POA: Diagnosis not present

## 2019-04-14 DIAGNOSIS — F4323 Adjustment disorder with mixed anxiety and depressed mood: Secondary | ICD-10-CM | POA: Diagnosis not present

## 2019-04-15 DIAGNOSIS — Q796 Ehlers-Danlos syndrome, unspecified: Secondary | ICD-10-CM | POA: Diagnosis not present

## 2019-04-15 DIAGNOSIS — M545 Low back pain: Secondary | ICD-10-CM | POA: Diagnosis not present

## 2019-04-15 DIAGNOSIS — G8929 Other chronic pain: Secondary | ICD-10-CM | POA: Diagnosis not present

## 2019-04-15 DIAGNOSIS — M25552 Pain in left hip: Secondary | ICD-10-CM | POA: Diagnosis not present

## 2019-04-15 DIAGNOSIS — Z79899 Other long term (current) drug therapy: Secondary | ICD-10-CM | POA: Diagnosis not present

## 2019-04-25 DIAGNOSIS — F4323 Adjustment disorder with mixed anxiety and depressed mood: Secondary | ICD-10-CM | POA: Diagnosis not present

## 2019-04-28 DIAGNOSIS — Z7189 Other specified counseling: Secondary | ICD-10-CM | POA: Diagnosis not present

## 2019-04-28 DIAGNOSIS — Z20828 Contact with and (suspected) exposure to other viral communicable diseases: Secondary | ICD-10-CM | POA: Diagnosis not present

## 2019-04-28 DIAGNOSIS — Z03818 Encounter for observation for suspected exposure to other biological agents ruled out: Secondary | ICD-10-CM | POA: Diagnosis not present

## 2019-05-10 DIAGNOSIS — Z03818 Encounter for observation for suspected exposure to other biological agents ruled out: Secondary | ICD-10-CM | POA: Diagnosis not present

## 2019-05-12 DIAGNOSIS — F4322 Adjustment disorder with anxiety: Secondary | ICD-10-CM | POA: Diagnosis not present

## 2019-05-31 DIAGNOSIS — Z7189 Other specified counseling: Secondary | ICD-10-CM | POA: Diagnosis not present

## 2019-05-31 DIAGNOSIS — Z03818 Encounter for observation for suspected exposure to other biological agents ruled out: Secondary | ICD-10-CM | POA: Diagnosis not present

## 2019-05-31 DIAGNOSIS — Z20828 Contact with and (suspected) exposure to other viral communicable diseases: Secondary | ICD-10-CM | POA: Diagnosis not present

## 2019-06-01 NOTE — Progress Notes (Signed)
Cardiology Office Note:   Date:  06/02/2019  NAME:  Betty Lewis    MRN: [id number] DOB:  1986-05-29   PCP:  Patient, No Pcp Per  Cardiologist:  No primary care provider on file.   Referring MD: Janyth Pupa, DO   Chief Complaint  Patient presents with   Loss of Consciousness   History of Present Illness:   Betty Lewis is a 33 y.o. female with a hx of PONV who is being seen today for the evaluation of dizziness/tachycardia at the request of Janyth Pupa, DO.  She reports her last several years she gets dizzy upon standing.  She also gets lightheadedness.  She has had no frank syncope per her report.  She reports that when sitting or laying down her heart rate is in the 60s.  Upon upright position change her heart rate can go in the 120-130 range.  She reports she feels very lightheaded and dizzy with this but has never had any syncope.  It appears her symptoms have gone on for a while.  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  She has a focus on mental health.  She also reports fatigue, lack of concentration, and possible depression.  She does report that she does not feel she has frank depression but has considered in the past.  She reports that her thyroid studies were recently checked by her PCP and were normal.  She also reports that her mother has a questionable history of Ehlers-Danlos.  She reports that she is hypermobile and has stretchy skin.  She reports that she probably needs to be evaluated for this.  She reports that she has not been able to exercise as she has in the past.  This is due to the coronavirus pandemic.  She reports that she can walk after her dizziness resolves but is not able to walk more than a mile given her labrum tear in her left leg.  She also has issues with her right shoulder.  Despite this she describes no chest pain, shortness of breath.  She can report what she describes as air hunger with standing up.  She is  never had a heart attack or stroke.  Her mother does have a history of diabetes and a pacemaker.  She also reports she had a valve replaced as well as coronary heart disease.  She does not smoke and does not excessively drink alcohol.  No illicit drug use reported.  She reports she stays well-hydrated.  Past Medical History: Past Medical History:  Diagnosis Date   Bulging lumbar disc    X 2   PONV (postoperative nausea and vomiting)     Past Surgical History: Past Surgical History:  Procedure Laterality Date   HYSTEROSCOPY N/A 11/06/2017   Procedure: HYSTEROSCOPY;  Surgeon: Janyth Pupa, DO;  Location: [facility];  Service: Gynecology;  Laterality: N/A;   IUD REMOVAL N/A 11/06/2017   Procedure: INTRAUTERINE DEVICE (IUD) REMOVAL;  Surgeon: Janyth Pupa, DO;  Location: [facility];  Service: Gynecology;  Laterality: N/A;   LEEP  2013   TONSILLECTOMY  2005   WISDOM TOOTH EXTRACTION  2004    Current Medications: No outpatient medications have been marked as taking for the 06/02/19 encounter (Office Visit) with O'Neal, Cassie Freer, MD.     Allergies:    Codeine   Social History: Social History   Socioeconomic History   Marital status: Single    Spouse name: Not on  file   Number of children: Not on file   Years of education: Not on file   Highest education level: Not on file  Occupational History   Not on file  Social Needs   Financial resource strain: Not on file   Food insecurity    Worry: Not on file    Inability: Not on file   Transportation needs    Medical: Not on file    Non-medical: Not on file  Tobacco Use   Smoking status: Never Smoker   Smokeless tobacco: Never Used  Substance and Sexual Activity   Alcohol use: Not Currently    Alcohol/week: 2.0 standard drinks    Types: 2 Glasses of wine per week    Comment: once a month   Drug use: Never   Sexual activity: Yes  Lifestyle   Physical activity    Days per week: Not on file    Minutes  per session: Not on file   Stress: Not on file  Relationships   Social connections    Talks on phone: Not on file    Gets together: Not on file    Attends religious service: Not on file    Active member of club or organization: Not on file    Attends meetings of clubs or organizations: Not on file    Relationship status: Not on file  Other Topics Concern   Not on file  Social History Narrative   Works at [employer] History: The patient's family history includes CAD in her mother; Diabetes in her mother; Heart disease in her mother.  ROS:   All other ROS reviewed and negative. Pertinent positives noted in the HPI.     EKGs/Labs/Other Studies Reviewed:   The following studies were personally reviewed by me today:  EKG:  EKG is ordered today.  The ekg ordered today demonstrates normal sinus rhythm with sinus arrhythmia, no acute ST-T changes, normal intervals, and was personally reviewed by me.   Recent Labs: No results found for requested labs within last 8760 hours.   Recent Lipid Panel No results found for: CHOL, TRIG, HDL, CHOLHDL, VLDL, LDLCALC, LDLDIRECT  Physical Exam:   VS:  BP 100/70    Pulse 89    Ht 5\' 6"  (1.676 m)    Wt 142 lb (64.4 kg)    SpO2 99%    BMI 22.92 kg/m    Wt Readings from Last 3 Encounters:  06/02/19 142 lb (64.4 kg)  11/06/17 168 lb (76.2 kg)    General: Well nourished, well developed, in no acute distress Heart: Atraumatic, normal size  Eyes: PEERLA, EOMI  Neck: Supple, no JVD Endocrine: No thryomegaly Cardiac: Normal S1, S2; RRR; no murmurs, rubs, or gallops Lungs: Clear to auscultation bilaterally, no wheezing, rhonchi or rales  Abd: Soft, nontender, no hepatomegaly  Ext: No edema, pulses 2+ Musculoskeletal: No deformities, BUE and BLE strength normal and equal Skin: Warm and dry, no rashes   Neuro: Alert and oriented to person, place, time, and situation, CNII-XII grossly intact, no focal deficits  Psych: Normal mood and affect     ASSESSMENT:   Betty Lewis is a 33 y.o. female who presents for the following: 1. Dizziness   2. Palpitations   3. POTS (postural orthostatic tachycardia syndrome)     PLAN:   1. Dizziness 2. Palpitations 3. POTS (postural orthostatic tachycardia syndrome) -She presents with several years of dizziness upon standing.  EKG is very normal today.  Cardiac examination is very normal as well.  She also reports tachycardia that occurs when standing.  She has basically self diagnosed herself with this condition.  I have encouraged her for conservative measures first.  We will start with increased salt intake and increase oral hydration.  I have also encouraged her to consider supine exercise such as rowing or exercise bike.  This is difficult during the coronavirus pandemic as gyms are closed.  Hopefully we get back in the shortly.  The next option would be for her to start either midodrine or low-dose beta-blocker.  I think we would see her back in 1 month after she has attempted conservative measures before we go to medication therapy.  I also encouraged her to look for good avenues of stress reduction.  We will also obtain an echocardiogram as she reports possibly her mother has Ehlers-Danlos.  We will evaluate this a bit more at our next visit.  I think a lot of this will depend on what we see on the echo.  She has no murmurs on exam or any clicks to suggest mitral valve prolapse.  We will await the results of this before that.  Due to the symptoms that mainly occur with standing.  I see little utility in a monitor at this point we could consider this in the future but I still believe she is diagnosed herself.  Disposition: Return in about 1 month (around 07/02/2019).  Medication Adjustments/Labs and Tests Ordered: Current medicines are reviewed at length with the patient today.  Concerns regarding medicines are outlined above.  Orders Placed This Encounter  Procedures   EKG 12-Lead    ECHOCARDIOGRAM COMPLETE   No orders of the defined types were placed in this encounter.  Patient Instructions  Medication Instructions:  NO CHANGE *If you need a refill on your cardiac medications before your next appointment, please call your pharmacy*  Lab Work: If you have labs (blood work) drawn today and your tests are completely normal, you will receive your results only by:  [facility] (if you have MyChart) OR  A paper copy in the mail If you have any lab test that is abnormal or we need to change your treatment, we will call you to review the results.  Testing/Procedures: Your physician has requested that you have an echocardiogram. Echocardiography is a painless test that uses sound waves to create images of your heart. It provides your doctor with information about the size and shape of your heart and how well your hearts chambers and valves are working. This procedure takes approximately one hour. There are no restrictions for this procedure.[place]    Follow-Up: At [facility], you and your health needs are our priority.  As part of our continuing mission to provide you with exceptional heart care, we have created designated Provider Care Teams.  These Care Teams include your primary Cardiologist (physician) and Advanced Practice Providers (APPs -  Physician Assistants and Nurse Practitioners) who all work together to provide you with the care you need, when you need it.  Your next appointment:   1 month(s)  The format for your next appointment:   In Person  Provider:   Eleonore Chiquito, MD      Signed, Addison Naegeli. Audie Box, [place]  [address] [place], [place] [address] [phone]  06/02/2019 4:57 PM

## 2019-06-02 ENCOUNTER — Encounter: Payer: Self-pay | Admitting: Cardiovascular Disease

## 2019-06-02 ENCOUNTER — Ambulatory Visit (INDEPENDENT_AMBULATORY_CARE_PROVIDER_SITE_OTHER): Payer: BC Managed Care – PPO | Admitting: Cardiovascular Disease

## 2019-06-02 ENCOUNTER — Other Ambulatory Visit: Payer: Self-pay

## 2019-06-02 VITALS — BP 100/70 | HR 89 | Ht 66.0 in | Wt 142.0 lb

## 2019-06-02 DIAGNOSIS — I498 Other specified cardiac arrhythmias: Secondary | ICD-10-CM

## 2019-06-02 DIAGNOSIS — R42 Dizziness and giddiness: Secondary | ICD-10-CM

## 2019-06-02 DIAGNOSIS — R002 Palpitations: Secondary | ICD-10-CM

## 2019-06-02 DIAGNOSIS — F4322 Adjustment disorder with anxiety: Secondary | ICD-10-CM | POA: Diagnosis not present

## 2019-06-02 DIAGNOSIS — G90A Postural orthostatic tachycardia syndrome (POTS): Secondary | ICD-10-CM

## 2019-06-02 NOTE — Patient Instructions (Signed)
Medication Instructions:  NO CHANGE *If you need a refill on your cardiac medications before your next appointment, please call your pharmacy*  Lab Work: If you have labs (blood work) drawn today and your tests are completely normal, you will receive your results only by: [facility] MyChart Message (if you have MyChart) OR . A paper copy in the mail If you have any lab test that is abnormal or we need to change your treatment, we will call you to review the results.  Testing/Procedures: Your physician has requested that you have an echocardiogram. Echocardiography is a painless test that uses sound waves to create images of your heart. It provides your doctor with information about the size and shape of your heart and how well your heart's chambers and valves are working. This procedure takes approximately one hour. There are no restrictions for this procedure.[place]    Follow-Up: At [facility], you and your health needs are our priority.  As part of our continuing mission to provide you with exceptional heart care, we have created designated Provider Care Teams.  These Care Teams include your primary Cardiologist (physician) and Advanced Practice Providers (APPs -  Physician Assistants and Nurse Practitioners) who all work together to provide you with the care you need, when you need it.  Your next appointment:   1 month(s)  The format for your next appointment:   In Person  Provider:   Eleonore Chiquito, MD

## 2019-06-15 DIAGNOSIS — Z03818 Encounter for observation for suspected exposure to other biological agents ruled out: Secondary | ICD-10-CM | POA: Diagnosis not present

## 2019-06-16 ENCOUNTER — Ambulatory Visit (HOSPITAL_COMMUNITY): Payer: BC Managed Care – PPO | Attending: Cardiovascular Disease

## 2019-06-16 ENCOUNTER — Other Ambulatory Visit: Payer: Self-pay

## 2019-06-16 DIAGNOSIS — F4322 Adjustment disorder with anxiety: Secondary | ICD-10-CM | POA: Diagnosis not present

## 2019-06-16 DIAGNOSIS — R42 Dizziness and giddiness: Secondary | ICD-10-CM | POA: Diagnosis not present

## 2019-06-16 DIAGNOSIS — R002 Palpitations: Secondary | ICD-10-CM | POA: Diagnosis not present

## 2019-06-16 DIAGNOSIS — I498 Other specified cardiac arrhythmias: Secondary | ICD-10-CM

## 2019-06-16 DIAGNOSIS — G90A Postural orthostatic tachycardia syndrome (POTS): Secondary | ICD-10-CM

## 2019-07-10 NOTE — Progress Notes (Signed)
Cardiology Office Note:   Date:  07/11/2019  NAME:  Betty Lewis    MRN: [id number] DOB:  05/10/1986   PCP:  Patient, No Pcp Per  Cardiologist:  Evalina Field, MD   Referring MD: No ref. provider found   Chief Complaint  Patient presents with  . Palpitations   History of Present Illness:   Betty Lewis is a 33 y.o. female with a hx of palpitations who presents for follow-up.  She was seen 1 month ago with complaints of palpitations and dizziness upon standing.  Symptoms were likely consistent with POTS.  She also had concerns about Ehlers-Danlos.  An echocardiogram demonstrated normal aortic root, and normal ascending aorta.  Her aortic root measured 2.7 cm.  Her ascending aorta measured 2.8.  I personally reviewed the echocardiogram and these were acceptable images.  She continues to notice symptoms of positional dizziness and palpitations.  She reports simply standing to fix her hair can result in her heart rate increasing up to the 140s.  She has increased her salt intake as well as maintain adequate hydration.  She reports that thyroid studies as well as hemoglobin were normal checked by her GYN.  She reports no increased caffeine consumption.  She reports no excessive alcohol use or drug use.  She reports she has been unable to exercise in a supine fashion.  I did encourage her to use an exercise bike or rowing machine.  However, due to the pandemic she has been unable to do this.  She also has concerns for possible Ehlers-Danlos syndrome.  I discussed with her that she does not fit the vascular phenotype.  I have asked her to be seen by rheumatologist which will refer to her today.  Her mother also is being evaluated for this condition.  She also reports she continues to get excessively tired and fatigued.  Given normal laboratory data we are a bit perplexed.  I did talk with her about pursuing heart monitor and she is agreeable to this.  We also will start her on low-dose beta-blocker  to see if this helps her symptoms.  Past Medical History: Past Medical History:  Diagnosis Date  . Bulging lumbar disc    X 2  . PONV (postoperative nausea and vomiting)     Past Surgical History: Past Surgical History:  Procedure Laterality Date  . HYSTEROSCOPY N/A 11/06/2017   Procedure: HYSTEROSCOPY;  Surgeon: Janyth Pupa, DO;  Location: [facility];  Service: Gynecology;  Laterality: N/A;  . IUD REMOVAL N/A 11/06/2017   Procedure: INTRAUTERINE DEVICE (IUD) REMOVAL;  Surgeon: Janyth Pupa, DO;  Location: [facility];  Service: Gynecology;  Laterality: N/A;  . LEEP  2013  . TONSILLECTOMY  2005  . WISDOM TOOTH EXTRACTION  2004    Current Medications: Current Meds  Medication Sig  . cetirizine (ZYRTEC) 10 MG tablet Take 10 mg by mouth daily as needed for allergies.  Marland Kitchen ibuprofen (ADVIL,MOTRIN) 200 MG tablet Take 400 mg by mouth daily as needed for headache or moderate pain.     Allergies:    Codeine   Social History: Social History   Socioeconomic History  . Marital status: Single    Spouse name: Not on file  . Number of children: Not on file  . Years of education: Not on file  . Highest education level: Not on file  Occupational History  . Not on file  Tobacco Use  . Smoking status: Never Smoker  . Smokeless tobacco: Never Used  Substance and Sexual Activity  . Alcohol use: Not Currently    Alcohol/week: 2.0 standard drinks    Types: 2 Glasses of wine per week    Comment: once a month  . Drug use: Never  . Sexual activity: Yes  Other Topics Concern  . Not on file  Social History Narrative   Works at [employer]:   . Difficulty of Paying Living Expenses: Not on file  Food Insecurity:   . Worried About Charity fundraiser in the Last Year: Not on file  . Ran Out of Food in the Last Year: Not on file  Transportation Needs:   . Lack of Transportation (Medical): Not on file  . Lack of Transportation  (Non-Medical): Not on file  Physical Activity:   . Days of Exercise per Week: Not on file  . Minutes of Exercise per Session: Not on file  Stress:   . Feeling of Stress : Not on file  Social Connections:   . Frequency of Communication with Friends and Family: Not on file  . Frequency of Social Gatherings with Friends and Family: Not on file  . Attends Religious Services: Not on file  . Active Member of Clubs or Organizations: Not on file  . Attends Archivist Meetings: Not on file  . Marital Status: Not on file     Family History: The patient's family history includes CAD in her mother; Diabetes in her mother; Heart disease in her mother.  ROS:   All other ROS reviewed and negative. Pertinent positives noted in the HPI.     EKGs/Labs/Other Studies Reviewed:   The following studies were personally reviewed by me today:  TTE 06/16/2019  1. Left ventricular ejection fraction, by visual estimation, is 60 to 65%. The left ventricle has normal function. There is no left ventricular hypertrophy.  2. Global right ventricle has normal systolic function.The right ventricular size is normal. No increase in right ventricular wall thickness.  3. Left atrial size was normal.  4. Right atrial size was normal.  5. The mitral valve is normal in structure. No evidence of mitral valve regurgitation. No evidence of mitral stenosis.  6. The tricuspid valve is normal in structure. Tricuspid valve regurgitation is not demonstrated.  7. The aortic valve is tricuspid. Aortic valve regurgitation is not visualized. No evidence of aortic valve sclerosis or stenosis.  8. The pulmonic valve was normal in structure. Pulmonic valve regurgitation is not visualized.  9. Normal pulmonary artery systolic pressure. 10. The inferior vena cava is normal in size with greater than 50% respiratory variability, suggesting right atrial pressure of 3 mmHg.  Recent Labs: No results found for requested labs within  last 8760 hours.   Recent Lipid Panel No results found for: CHOL, TRIG, HDL, CHOLHDL, VLDL, LDLCALC, LDLDIRECT  Physical Exam:   VS:  BP 127/81   Pulse 90   Ht 5\' 6"  (1.676 m)   Wt 147 lb 3.2 oz (66.8 kg)   SpO2 100%   BMI 23.76 kg/m    Wt Readings from Last 3 Encounters:  07/11/19 147 lb 3.2 oz (66.8 kg)  06/02/19 142 lb (64.4 kg)  11/06/17 168 lb (76.2 kg)    General: Well nourished, well developed, in no acute distress Heart: Atraumatic, normal size  Eyes: PEERLA, EOMI  Neck: Supple, no JVD Endocrine: No thryomegaly Cardiac: Normal S1, S2; RRR; no murmurs, rubs, or gallops Lungs: Clear to auscultation  bilaterally, no wheezing, rhonchi or rales  Abd: Soft, nontender, no hepatomegaly  Ext: No edema, pulses 2+ Musculoskeletal: No deformities, BUE and BLE strength normal and equal Skin: Warm and dry, no rashes   Neuro: Alert and oriented to person, place, time, and situation, CNII-XII grossly intact, no focal deficits  Psych: Normal mood and affect   ASSESSMENT:   Betty Lewis is a 33 y.o. female who presents for the following: 1. POTS (postural orthostatic tachycardia syndrome)   2. Palpitations   3. Dizziness   4. Hypermobile joints    PLAN:   1. POTS (postural orthostatic tachycardia syndrome) 2. Palpitations 3. Dizziness -She reports symptoms of postural dizziness as well as palpitations heart rate can get up in the 140s just while standing.  She appears to have failed conservative measures including adequate hydration and has even tried a salt substitute.  We will continue with increased hydration as well as started on low-dose pindolol 5 mg twice daily.  She was counseled extensively that she may feel little more fatigue with this.  She will let us know how this goes.  We could try low-dose propranolol if this fails.  We will also proceed with a 48-hour Holter monitor to ensure there are no significant arrhythmias here.  We will also get an idea of what her heart  rate is doing.  She does not appear to have a vascular phenotype of Ehlers-Danlos.  Her aorta is normal and she has no evidence of mitral valve prolapse.  I will refer her to rheumatology to be evaluated for this.  We will plan to see her back in about 2 months via virtual visit to see how her symptoms are doing.  I would also like to see her pursue some supine exercise but I understand it is difficult to stand to do this.  Stationary bike or rowing machine would be ideal in this situation.  Disposition: Return in about 2 months (around 09/11/2019).  Medication Adjustments/Labs and Tests Ordered: Current medicines are reviewed at length with the patient today.  Concerns regarding medicines are outlined above.  Orders Placed This Encounter  Procedures  . Ambulatory referral to Rheumatology  . Holter monitor - 48 hour   Meds ordered this encounter  Medications  . pindolol (VISKEN) 5 MG tablet    Sig: Take 1 tablet (5 mg total) by mouth 2 (two) times daily.    Dispense:  60 tablet    Refill:  6   Patient Instructions  Medication Instructions:   start taking Pindolol 5 mg one tablet daily    *If you need a refill on your cardiac medications before your next appointment, please call your pharmacy*  Lab Work: Not needed   Testing/Procedures: Will be schedule  At [place] has recommended that you wear a holter monitor 48 or 3 day . Holter monitors are medical devices that record the heart's electrical activity. Doctors most often use these monitors to diagnose arrhythmias. Arrhythmias are problems with the speed or rhythm of the heartbeat. The monitor is a small, portable device. You can wear one while you do your normal daily activities. This is usually used to diagnose what is causing palpitations/syncope (passing out).  Your physician has recommended that you wear a 3 DAY ZIO-PATCH monitor. The Zio patch cardiac monitor continuously records heart  rhythm data for up to 14 days, this is for patients being evaluated for multiple types heart rhythms. For the first 24  hours post application, please avoid getting the Zio monitor wet in the shower or by excessive sweating during exercise. After that, feel free to carry on with regular activities. Keep soaps and lotions away from the ZIO XT Patch.  This will be mailed to you, please expect 7-10 days to receive.   [employer] location - [place], [address].        Follow-Up: At [facility], you and your health needs are our priority.  As part of our continuing mission to provide you with exceptional heart care, we have created designated Provider Care Teams.  These Care Teams include your primary Cardiologist (physician) and Advanced Practice Providers (APPs -  Physician Assistants and Nurse Practitioners) who all work together to provide you with the care you need, when you need it.  Your next appointment:   2 month(s)  The format for your next appointment:   Virtual Visit   Provider:   Eleonore Chiquito, MD  Other Instructions    ZIO XT- Long Term Monitor Instructions   Your physician has requested you wear your ZIO patch monitor___3____days.   This is a single patch monitor.  [employer] supplies one patch monitor per enrollment.  Additional stickers are not available.   Please do not apply patch if you will be having a Nuclear Stress Test, Echocardiogram, Cardiac CT, MRI, or Chest Xray during the time frame you would be wearing the monitor. The patch cannot be worn during these tests.  You cannot remove and re-apply the ZIO XT patch monitor.   Your ZIO patch monitor will be sent USPS Priority mail from [facility] directly to your home address. The monitor may also be mailed to a PO BOX if home delivery is not available.   It may take 3-5 days to receive your monitor after you have been enrolled.   Once you have received you monitor, please review enclosed instructions.   Your monitor has already been registered assigning a specific monitor serial # to you.   Applying the monitor   Shave hair from upper left chest.   Hold abrader disc by orange tab.  Rub abrader in 40 strokes over left upper chest as indicated in your monitor instructions.   Clean area with 4 enclosed alcohol pads .  Use all pads to assure are is cleaned thoroughly.  Let dry.   Apply patch as indicated in monitor instructions.  Patch will be place under collarbone on left side of chest with arrow pointing upward.   Rub patch adhesive wings for 2 minutes.Remove white label marked "1".  Remove white label marked "2".  Rub patch adhesive wings for 2 additional minutes.   While looking in a mirror, press and release button in center of patch.  A small green light will flash 3-4 times .  This will be your only indicator the monitor has been turned on.     Do not shower for the first 24 hours.  You may shower after the first 24 hours.   Press button if you feel a symptom. You will hear a small click.  Record Date, Time and Symptom in the Patient Log Book.   When you are ready to remove patch, follow instructions on last 2 pages of Patient Log Book.  Stick patch monitor onto last page of Patient Log Book.   Place Patient Log Book in [place] box.  Use locking tab on box and tape box closed securely.  The [place] and [employer]  has prepaid postage on it.  Please place in mailbox as soon as possible.  Your physician should have your test results approximately 7 days after the monitor has been mailed back to [facility].   Call [place] at [phone] if you have questions regarding your ZIO XT patch monitor.  Call them immediately if you see an orange light blinking on your monitor.   If your monitor falls off in less than 4 days contact our Monitor department at [phone].  If your monitor becomes loose or falls off after 4 days call [employer] at [phone] for suggestions on  securing your monitor.         Signed, Addison Naegeli. Audie Box, [place]  [address], [place], [place] [address] [phone]  07/11/2019 4:26 PM

## 2019-07-11 ENCOUNTER — Encounter: Payer: Self-pay | Admitting: *Deleted

## 2019-07-11 ENCOUNTER — Ambulatory Visit (INDEPENDENT_AMBULATORY_CARE_PROVIDER_SITE_OTHER): Payer: BC Managed Care – PPO | Admitting: Cardiovascular Disease

## 2019-07-11 ENCOUNTER — Encounter: Payer: Self-pay | Admitting: Cardiovascular Disease

## 2019-07-11 ENCOUNTER — Other Ambulatory Visit: Payer: Self-pay

## 2019-07-11 VITALS — BP 127/81 | HR 90 | Ht 66.0 in | Wt 147.2 lb

## 2019-07-11 DIAGNOSIS — R42 Dizziness and giddiness: Secondary | ICD-10-CM

## 2019-07-11 DIAGNOSIS — I498 Other specified cardiac arrhythmias: Secondary | ICD-10-CM

## 2019-07-11 DIAGNOSIS — M249 Joint derangement, unspecified: Secondary | ICD-10-CM

## 2019-07-11 DIAGNOSIS — R002 Palpitations: Secondary | ICD-10-CM | POA: Diagnosis not present

## 2019-07-11 DIAGNOSIS — G90A Postural orthostatic tachycardia syndrome (POTS): Secondary | ICD-10-CM

## 2019-07-11 MED ORDER — PINDOLOL 5 MG PO TABS: 5.0000 mg | ORAL_TABLET | Freq: Two times a day (BID) | ORAL | 6 refills | Status: DC

## 2019-07-11 NOTE — Progress Notes (Signed)
Patient ID: Betty Lewis, female   DOB: 11-Aug-1985, 33 y.o.   MRN: [id number] Patient enrolled for 3 day ZIO XT long term holter monitor to be mailed to her home.

## 2019-07-11 NOTE — Patient Instructions (Addendum)
Medication Instructions:   start taking Pindolol 5 mg one tablet twice a daily    *If you need a refill on your cardiac medications before your next appointment, please call your pharmacy*  Lab Work: Not needed   Testing/Procedures: Will be schedule  At [place] has recommended that you wear a holter monitor 48 or 3 day . Holter monitors are medical devices that record the heart's electrical activity. Doctors most often use these monitors to diagnose arrhythmias. Arrhythmias are problems with the speed or rhythm of the heartbeat. The monitor is a small, portable device. You can wear one while you do your normal daily activities. This is usually used to diagnose what is causing palpitations/syncope (passing out).  Your physician has recommended that you wear a 3 DAY ZIO-PATCH monitor. The Zio patch cardiac monitor continuously records heart rhythm data for up to 14 days, this is for patients being evaluated for multiple types heart rhythms. For the first 24 hours post application, please avoid getting the Zio monitor wet in the shower or by excessive sweating during exercise. After that, feel free to carry on with regular activities. Keep soaps and lotions away from the ZIO XT Patch.  This will be mailed to you, please expect 7-10 days to receive.   [employer] location - [place], [address].        Follow-Up: At [facility], you and your health needs are our priority.  As part of our continuing mission to provide you with exceptional heart care, we have created designated Provider Care Teams.  These Care Teams include your primary Cardiologist (physician) and Advanced Practice Providers (APPs -  Physician Assistants and Nurse Practitioners) who all work together to provide you with the care you need, when you need it.  Your next appointment:   2 month(s)  The format for your next appointment:   Virtual Visit   Provider:   Eleonore Chiquito,  MD  Other Instructions    ZIO XT- Long Term Monitor Instructions   Your physician has requested you wear your ZIO patch monitor___3____days.   This is a single patch monitor.  [employer] supplies one patch monitor per enrollment.  Additional stickers are not available.   Please do not apply patch if you will be having a Nuclear Stress Test, Echocardiogram, Cardiac CT, MRI, or Chest Xray during the time frame you would be wearing the monitor. The patch cannot be worn during these tests.  You cannot remove and re-apply the ZIO XT patch monitor.   Your ZIO patch monitor will be sent USPS Priority mail from [facility] directly to your home address. The monitor may also be mailed to a PO BOX if home delivery is not available.   It may take 3-5 days to receive your monitor after you have been enrolled.   Once you have received you monitor, please review enclosed instructions.  Your monitor has already been registered assigning a specific monitor serial # to you.   Applying the monitor   Shave hair from upper left chest.   Hold abrader disc by orange tab.  Rub abrader in 40 strokes over left upper chest as indicated in your monitor instructions.   Clean area with 4 enclosed alcohol pads .  Use all pads to assure are is cleaned thoroughly.  Let dry.   Apply patch as indicated in monitor instructions.  Patch will be place under collarbone on left side of chest with  arrow pointing upward.   Rub patch adhesive wings for 2 minutes.Remove white label marked "1".  Remove white label marked "2".  Rub patch adhesive wings for 2 additional minutes.   While looking in a mirror, press and release button in center of patch.  A small green light will flash 3-4 times .  This will be your only indicator the monitor has been turned on.     Do not shower for the first 24 hours.  You may shower after the first 24 hours.   Press button if you feel a symptom. You will hear a small click.  Record Date,  Time and Symptom in the Patient Log Book.   When you are ready to remove patch, follow instructions on last 2 pages of Patient Log Book.  Stick patch monitor onto last page of Patient Log Book.   Place Patient Log Book in [place] box.  Use locking tab on box and tape box closed securely.  The [place] and [employer] has [employer] on it.  Please place in mailbox as soon as possible.  Your physician should have your test results approximately 7 days after the monitor has been mailed back to [facility].   Call [place] at [phone] if you have questions regarding your ZIO XT patch monitor.  Call them immediately if you see an orange light blinking on your monitor.   If your monitor falls off in less than 4 days contact our Monitor department at [phone].  If your monitor becomes loose or falls off after 4 days call [employer] at [phone] for suggestions on securing your monitor.

## 2019-07-16 ENCOUNTER — Ambulatory Visit (INDEPENDENT_AMBULATORY_CARE_PROVIDER_SITE_OTHER): Payer: BC Managed Care – PPO

## 2019-07-16 DIAGNOSIS — R42 Dizziness and giddiness: Secondary | ICD-10-CM

## 2019-07-16 DIAGNOSIS — R002 Palpitations: Secondary | ICD-10-CM

## 2019-07-16 DIAGNOSIS — I498 Other specified cardiac arrhythmias: Secondary | ICD-10-CM | POA: Diagnosis not present

## 2019-07-16 DIAGNOSIS — G90A Postural orthostatic tachycardia syndrome (POTS): Secondary | ICD-10-CM

## 2019-07-18 DIAGNOSIS — D2261 Melanocytic nevi of right upper limb, including shoulder: Secondary | ICD-10-CM | POA: Diagnosis not present

## 2019-07-18 DIAGNOSIS — L650 Telogen effluvium: Secondary | ICD-10-CM | POA: Diagnosis not present

## 2019-07-18 DIAGNOSIS — D225 Melanocytic nevi of trunk: Secondary | ICD-10-CM | POA: Diagnosis not present

## 2019-07-18 DIAGNOSIS — D2262 Melanocytic nevi of left upper limb, including shoulder: Secondary | ICD-10-CM | POA: Diagnosis not present

## 2019-07-27 DIAGNOSIS — Z01812 Encounter for preprocedural laboratory examination: Secondary | ICD-10-CM | POA: Diagnosis not present

## 2019-07-27 DIAGNOSIS — R29898 Other symptoms and signs involving the musculoskeletal system: Secondary | ICD-10-CM | POA: Diagnosis not present

## 2019-07-27 DIAGNOSIS — Z7409 Other reduced mobility: Secondary | ICD-10-CM | POA: Diagnosis not present

## 2019-07-27 DIAGNOSIS — R262 Difficulty in walking, not elsewhere classified: Secondary | ICD-10-CM | POA: Diagnosis not present

## 2019-07-27 DIAGNOSIS — Z20822 Contact with and (suspected) exposure to covid-19: Secondary | ICD-10-CM | POA: Diagnosis not present

## 2019-07-27 DIAGNOSIS — M25552 Pain in left hip: Secondary | ICD-10-CM | POA: Diagnosis not present

## 2019-07-27 DIAGNOSIS — M25652 Stiffness of left hip, not elsewhere classified: Secondary | ICD-10-CM | POA: Diagnosis not present

## 2019-07-28 DIAGNOSIS — M65852 Other synovitis and tenosynovitis, left thigh: Secondary | ICD-10-CM | POA: Diagnosis not present

## 2019-07-28 DIAGNOSIS — Q7962 Hypermobile Ehlers-Danlos syndrome: Secondary | ICD-10-CM | POA: Diagnosis not present

## 2019-07-28 DIAGNOSIS — M6588 Other synovitis and tenosynovitis, other site: Secondary | ICD-10-CM | POA: Diagnosis not present

## 2019-07-28 DIAGNOSIS — G44099 Other trigeminal autonomic cephalgias (TAC), not intractable: Secondary | ICD-10-CM | POA: Diagnosis not present

## 2019-07-28 DIAGNOSIS — I498 Other specified cardiac arrhythmias: Secondary | ICD-10-CM | POA: Diagnosis not present

## 2019-07-28 DIAGNOSIS — S73192A Other sprain of left hip, initial encounter: Secondary | ICD-10-CM | POA: Diagnosis not present

## 2019-07-28 DIAGNOSIS — X58XXXA Exposure to other specified factors, initial encounter: Secondary | ICD-10-CM | POA: Diagnosis not present

## 2019-07-28 DIAGNOSIS — R002 Palpitations: Secondary | ICD-10-CM | POA: Diagnosis not present

## 2019-07-28 DIAGNOSIS — M25552 Pain in left hip: Secondary | ICD-10-CM | POA: Diagnosis not present

## 2019-07-28 DIAGNOSIS — R42 Dizziness and giddiness: Secondary | ICD-10-CM | POA: Diagnosis not present

## 2019-07-28 DIAGNOSIS — M25852 Other specified joint disorders, left hip: Secondary | ICD-10-CM | POA: Diagnosis not present

## 2019-07-28 DIAGNOSIS — M899 Disorder of bone, unspecified: Secondary | ICD-10-CM | POA: Diagnosis not present

## 2019-07-28 DIAGNOSIS — M94252 Chondromalacia, left hip: Secondary | ICD-10-CM | POA: Diagnosis not present

## 2019-07-28 HISTORY — PX: HIP ARTHROSCOPY: SHX668

## 2019-07-29 DIAGNOSIS — M6588 Other synovitis and tenosynovitis, other site: Secondary | ICD-10-CM | POA: Diagnosis not present

## 2019-07-29 DIAGNOSIS — Q7962 Hypermobile Ehlers-Danlos syndrome: Secondary | ICD-10-CM | POA: Diagnosis not present

## 2019-07-29 DIAGNOSIS — S73192A Other sprain of left hip, initial encounter: Secondary | ICD-10-CM | POA: Diagnosis not present

## 2019-07-29 DIAGNOSIS — M94252 Chondromalacia, left hip: Secondary | ICD-10-CM | POA: Diagnosis not present

## 2019-07-29 DIAGNOSIS — G44099 Other trigeminal autonomic cephalgias (TAC), not intractable: Secondary | ICD-10-CM | POA: Diagnosis not present

## 2019-07-29 DIAGNOSIS — M899 Disorder of bone, unspecified: Secondary | ICD-10-CM | POA: Diagnosis not present

## 2019-07-29 DIAGNOSIS — X58XXXA Exposure to other specified factors, initial encounter: Secondary | ICD-10-CM | POA: Diagnosis not present

## 2019-07-29 DIAGNOSIS — M25552 Pain in left hip: Secondary | ICD-10-CM | POA: Diagnosis not present

## 2019-08-01 DIAGNOSIS — M25652 Stiffness of left hip, not elsewhere classified: Secondary | ICD-10-CM | POA: Diagnosis not present

## 2019-08-01 DIAGNOSIS — M25552 Pain in left hip: Secondary | ICD-10-CM | POA: Diagnosis not present

## 2019-08-03 DIAGNOSIS — M25552 Pain in left hip: Secondary | ICD-10-CM | POA: Diagnosis not present

## 2019-08-07 ENCOUNTER — Other Ambulatory Visit: Payer: Self-pay | Admitting: Cardiovascular Disease

## 2019-08-07 MED ORDER — PROPRANOLOL HCL 10 MG PO TABS: 10.0000 mg | ORAL_TABLET | Freq: Two times a day (BID) | ORAL | 3 refills | Status: DC

## 2019-08-07 NOTE — Progress Notes (Signed)
Changed prescription to propranolol 10 mg BID. Pindolol was a bit cost prohibitive.   [place] T. Audie Box, [place]  [address], [place], [place] [address] [phone]  10:40 AM

## 2019-08-08 DIAGNOSIS — M25552 Pain in left hip: Secondary | ICD-10-CM | POA: Diagnosis not present

## 2019-08-08 DIAGNOSIS — M25652 Stiffness of left hip, not elsewhere classified: Secondary | ICD-10-CM | POA: Diagnosis not present

## 2019-08-09 NOTE — Progress Notes (Signed)
Office Visit Note  Patient: Betty Lewis             Date of Birth: 04/14/1986           MRN: [id number]             PCP: Patient, No Pcp Per Referring: Geralynn Rile, * Visit Date: 08/12/2019 Occupation: @GUAROCC @  Subjective:  Hypermobility and joint pain  History of Present Illness: ALFA HIGGS is a 34 y.o. female seen in consultation per request of Dr. Audie Box.  According to patient she has noticed increased joint pain and joint mobility for last 7 years.  She states 5 years ago she started having lower back pain at the time she was seen by an orthopedic surgeon who did x-rays and diagnosed her with degenerative disease of lumbar spine.  She went to physical therapy.  She was also told that she had hypermobility.  She states that over time she has had pain in her shoulders, her hips and her lower back.  2 years ago she started having increased left hip pain.  At the time she initially had physical therapy for her back but when the symptoms did not improve she had MRI of her left hip which showed labral tear.  As she was trying to sleep on her right side she had MRI of her right shoulder joint which was painful and it showed labral tear as well.  Gradually the hip pain got worse and she underwent left hip arthroscopic surgery recently.  She is wearing a brace.  She states she has her flexibility in her C-spine as well.  Although she does not have any C-spine pain.  She has been diagnosed with bilateral carpal tunnel syndrome in the past.  She had bilateral carpal doctoral injections about 20 to half years ago which helped.  She denies any history of joint swelling.  She states that in 2005 she had swelling of her MTP joint and she was given possible diagnosis of gout but she has had no recurrence of it.  Her mother and maternal grandmother both have gout.  She also gives history of psoriasis.  She states she was diagnosed with psoriasis in her childhood.  She has occasional rash for  which she used topical agents.  She states she sees Dr. Amy Martinique for the prescription but Dr. Martinique has not diagnosed her with psoriasis.  She has been also seeing a cardiologist because of palpitations.  She was requested recently diagnosed with pots disease.  She has been using beta-blockers for palpitations.  Activities of Daily Living:  Patient reports morning stiffness for 20  minutes.   Patient Reports nocturnal pain.  Difficulty dressing/grooming: Reports Difficulty climbing stairs: Reports Difficulty getting out of chair: Reports Difficulty using hands for taps, buttons, cutlery, and/or writing: Denies  Review of Systems  Constitutional: Positive for fatigue. Negative for night sweats, weight gain and weight loss.  HENT: Negative for mouth sores, trouble swallowing, trouble swallowing, mouth dryness and nose dryness.   Eyes: Negative for pain, redness, visual disturbance and dryness.  Respiratory: Negative for cough, hemoptysis, shortness of breath and difficulty breathing.   Cardiovascular: Negative for chest pain, palpitations, hypertension, irregular heartbeat and swelling in legs/feet.  Gastrointestinal: Negative for blood in stool, constipation and diarrhea.  Endocrine: Negative for increased urination.  Genitourinary: Negative for painful urination and vaginal dryness.  Musculoskeletal: Positive for arthralgias, joint pain and morning stiffness. Negative for joint swelling, myalgias, muscle weakness, muscle tenderness  and myalgias.  Skin: Positive for rash. Negative for color change, pallor, hair loss, nodules/bumps, skin tightness, ulcers and sensitivity to sunlight.       psoriasis  Allergic/Immunologic: Negative for susceptible to infections.  Neurological: Positive for dizziness and numbness. Negative for headaches, memory loss, night sweats and weakness.  Hematological: Negative for swollen glands.  Psychiatric/Behavioral: Negative for depressed mood and sleep  disturbance. The patient is not nervous/anxious.     PMFS History:  There are no problems to display for this patient.   Past Medical History:  Diagnosis Date   Bulging lumbar disc    X 2   PONV (postoperative nausea and vomiting)     Family History  Problem Relation Age of Onset   Heart disease Mother    CAD Mother    Diabetes Mother    Past Surgical History:  Procedure Laterality Date   HIP ARTHROSCOPY Left 07/28/2019   HYSTEROSCOPY N/A 11/06/2017   Procedure: HYSTEROSCOPY;  Surgeon: Janyth Pupa, DO;  Location: [facility];  Service: Gynecology;  Laterality: N/A;   IUD REMOVAL N/A 11/06/2017   Procedure: INTRAUTERINE DEVICE (IUD) REMOVAL;  Surgeon: Janyth Pupa, DO;  Location: [facility];  Service: Gynecology;  Laterality: N/A;   LEEP  2013   TONSILLECTOMY  2005   WISDOM TOOTH EXTRACTION  2004   Social History   Social History Narrative   Works at [employer]    There is no immunization history on file for this patient.   Objective: Vital Signs: BP 100/66 (BP Location: Right Arm, Patient Position: Sitting, Cuff Size: Normal)    Pulse 61    Resp 12    Ht 5\' 6"  (1.676 m) Comment: per patient   Wt 138 lb (62.6 kg) Comment: per patient   BMI 22.27 kg/m    Physical Exam Vitals and nursing note reviewed.  Constitutional:      Appearance: She is well-developed.  HENT:     Head: Normocephalic and atraumatic.  Eyes:     Conjunctiva/sclera: Conjunctivae normal.  Cardiovascular:     Rate and Rhythm: Normal rate and regular rhythm.     Heart sounds: Normal heart sounds.  Pulmonary:     Effort: Pulmonary effort is normal.     Breath sounds: Normal breath sounds.  Abdominal:     General: Bowel sounds are normal.     Palpations: Abdomen is soft.  Musculoskeletal:     Cervical back: Normal range of motion.  Lymphadenopathy:     Cervical: No cervical adenopathy.  Skin:    General: Skin is warm and dry.     Capillary Refill: Capillary refill takes less than 2 seconds.      Comments: Skin laxity was noted.  No rash was noted.  Neurological:     Mental Status: She is alert and oriented to person, place, and time.  Psychiatric:        Behavior: Behavior normal.      Musculoskeletal Exam: She had good range of motion of her C-spine thoracic and lumbar spine.  She states she is able to reach her the ground with the palm of her hands.  We could not do range of motion examination as she had recent surgery on her left hip when she is nonweightbearing.  Possible levoscoliosis was noted which was also difficult to assess as she could not bear weight on her left leg.  She had to increase flexibility in her shoulder joints.  She has hyperextension of bilateral elbow joints.  She is  able to touch the volar aspect of her forearm with her thumb.  Her had very good range of motion of her right hip joint.  Left hip joint could not be assessed.  She has hyperextension of her knee joints.  Good flexibility in her ankle joints.  No joint swelling was noted.  CDAI Exam: CDAI Score: -- Patient Global: --; Provider Global: -- Swollen: --; Tender: -- Joint Exam 08/12/2019   No joint exam has been documented for this visit   There is currently no information documented on the homunculus. Go to the Rheumatology activity and complete the homunculus joint exam.  Investigation: No additional findings.  Imaging: LONG TERM MONITOR (3-14 DAYS)  Result Date: 08/04/2019 Enrollment Periods 07/16/2019-07/19/2019 (2 days 18 hours). Patient had a min HR of 49 bpm (sinus bradycardia), max HR of 171 bpm (sinus tachycardia), and avg HR of 79 bpm (normal sinus rhythm). Predominant underlying rhythm was Sinus Rhythm. Isolated SVEs were rare (<1.0%), SVE Couplets were rare (<1.0%), and no SVE Triplets were present. Isolated VEs were rare (<1.0%), and no VE Couplets or VE Triplets were present. No atrial fibrillation. Diary summarized below: 07/16/19 11:08 AM: dizziness/light headed coincided with normal  sinus rhythm 90 bpm while standing. 07/16/19 1:48 PM: short of breath coincided with normal sinus rhythm 88 bpm while sitting. 07/16/19 2:00 PM: symptoms of pounding coincided with normal sinus rhythm 73 bpm while laying down.  07/16/19 4:48 PM: symptoms of pounding/short of breath coincided with sinus tachycardia 106 bpm while walking around. 07/16/19 11:49 PM: symptoms of pounding coincided with normal sinus rhythm 80 bpm while laying down. 07/17/19 7:55 PM: symptoms of pounding coincided with normal sinus rhythm 79 bpm while on couch. 07/17/19 8:12 PM: symptoms of fluttering/pounding coincided with normal sinus rhythm 76 bpm while laying in bed. 07/17/19 8:30 PM: symptoms of fluttering/pounding coincided with normal sinus rhythm 94 bpm while sitting. 07/18/19 7:29 AM: symptoms of fluttering/racing/light headed/pounding coincided with sinus tachycardia 136 bpm while standing. 07/18/19 8:52 AM: symptoms of fluttering/racing/pounding coincided with normal sinus rhythm 82 bpm while standing.  07/18/19 10:26 AM: symptoms of fluttering/racing/pounding coincided with sinus tachycardia 113 bpm while standing. 07/18/19 10:54 AM: symptoms of short of breath coincided with sinus tachycardia 110 bpm while standing. 07/18/19 10:58 AM: symptoms of fainted/fluttering/racing/short of breath coincided with sinus tachycardia while standing in shower. 07/18/19 12:24 PM: symptoms of fluttering/short of breath coincided with normal sinus rhythm 91 bpm while standing. 07/18/19 3:54 PM: symptoms of light headed/short of breath coincided with normal sinus rhythm 97 bpm while standing. 07/18/19 4:41 PM: symptoms of fluttering/pounding coincided with normal sinus rhythm 89 bpm. 07/18/19 6:12 PM: symptoms of fluttering/racing/light headed/pounding coincided with sinus tachycardia 108 bpm while standing. 07/18/19 8:17 PM: symptoms of fluttering/racing/light headed/short of breath coincided with normal sinus rhythm 89 bpm while standing. Impression: 1. No arrhythmias to  explain symptoms. 2. Symptoms mainly occurred with normal sinus rhythm. 3. A few symptoms occurred with sinus tachycardia with rates higher than expected for activities reported (e.g., standing). 4. Rare ectopy. [place] T. Audie Box, [place] [address], [address] [place], [place] [address] [phone] 7:13 AM    Recent Labs: Lab Results  Component Value Date   WBC 8.0 11/06/2017   HGB 14.7 11/06/2017   PLT 278 11/06/2017   NA 141 11/06/2017   K 4.4 11/06/2017   CL 107 11/06/2017   CO2 23 11/06/2017   GLUCOSE 97 11/06/2017   BUN 9 11/06/2017  CREATININE 0.58 11/06/2017   BILITOT 1.7 (H) 11/06/2017   ALKPHOS 50 11/06/2017   AST 21 11/06/2017   ALT 15 11/06/2017   PROT 7.6 11/06/2017   ALBUMIN 4.5 11/06/2017   CALCIUM 9.2 11/06/2017   GFRAA >60 11/06/2017    Speciality Comments: No specialty comments available.  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  Complete exam could not be performed as she she is nonweightbearing due to recent left hip surgery.  She had a skin laxity.  She also gives history of pregnancy bruising.  She has hypermobility in multiple joints.  Hyperextension of bilateral elbows and knee joints was noted.  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  I will refer her for genetic testing.  Detailed counseling regarding hypermobility syndrome was provided.  It is not unusual for people to suffer from chronic arthralgias and increased risk of osteoarthritis.  Joint protection and muscle strengthening was discussed.  Isometric exercises were discussed.  She may also benefit from physical therapy.  She states she is going to physical therapy and will discuss with her therapist.  She did also get a baseline eye examination.  Pain in left hip - Status post left hip arthroscopy for labral tear repair July 28, 2019.  She  continues to have some left hip joint.  She is nonweightbearing.  Chronic right shoulder pain-she states the MRI of her shoulder joint also showed labral tear.  Joint protection was discussed.  POTS (postural orthostatic tachycardia syndrome)-recently diagnosed.  She is on beta-blockers.  Palpitations - Evaluated by Dr. Audie Box on 07/11/19.  Echo unremarkable. Dx likely POTS  Psoriasis-patient states she was diagnosed with psoriasis as a child.  She currently does not have any active psoriasis lesions.  There is no evidence of psoriatic arthritis.  Other fatigue-it is not unusual to see fatigue with POTS and also with EDS.  Seasonal allergies-he takes medications on as needed basis.  Orders: No orders of the defined types were placed in this encounter.  No orders of the defined types were placed in this encounter.   Face-to-face time spent with patient was 40 minutes. Greater than 50% of time was spent in counseling and coordination of care.  Follow-Up Instructions: Return if symptoms worsen or fail to improve.   Bo Merino, MD  Note - This record has been created using Editor, commissioning.  Chart creation errors have been sought, but may not always  have been located. Such creation errors do not reflect on  the standard of medical care.

## 2019-08-10 DIAGNOSIS — M25552 Pain in left hip: Secondary | ICD-10-CM | POA: Diagnosis not present

## 2019-08-12 ENCOUNTER — Ambulatory Visit (INDEPENDENT_AMBULATORY_CARE_PROVIDER_SITE_OTHER): Payer: BC Managed Care – PPO | Admitting: Rheumatology

## 2019-08-12 ENCOUNTER — Other Ambulatory Visit: Payer: Self-pay

## 2019-08-12 ENCOUNTER — Encounter: Payer: Self-pay | Admitting: Rheumatology

## 2019-08-12 VITALS — BP 100/66 | HR 61 | Resp 12 | Ht 66.0 in | Wt 138.0 lb

## 2019-08-12 DIAGNOSIS — L409 Psoriasis, unspecified: Secondary | ICD-10-CM

## 2019-08-12 DIAGNOSIS — J302 Other seasonal allergic rhinitis: Secondary | ICD-10-CM

## 2019-08-12 DIAGNOSIS — M249 Joint derangement, unspecified: Secondary | ICD-10-CM | POA: Diagnosis not present

## 2019-08-12 DIAGNOSIS — I498 Other specified cardiac arrhythmias: Secondary | ICD-10-CM

## 2019-08-12 DIAGNOSIS — M25552 Pain in left hip: Secondary | ICD-10-CM

## 2019-08-12 DIAGNOSIS — Z20828 Contact with and (suspected) exposure to other viral communicable diseases: Secondary | ICD-10-CM | POA: Diagnosis not present

## 2019-08-12 DIAGNOSIS — M25511 Pain in right shoulder: Secondary | ICD-10-CM

## 2019-08-12 DIAGNOSIS — G8929 Other chronic pain: Secondary | ICD-10-CM

## 2019-08-12 DIAGNOSIS — R002 Palpitations: Secondary | ICD-10-CM

## 2019-08-12 DIAGNOSIS — R5383 Other fatigue: Secondary | ICD-10-CM

## 2019-08-12 DIAGNOSIS — G90A Postural orthostatic tachycardia syndrome (POTS): Secondary | ICD-10-CM

## 2019-08-12 NOTE — Addendum Note (Signed)
Addended by: Earnestine Mealing on: 08/12/2019 03:10 PM   Modules accepted: Orders

## 2019-08-16 DIAGNOSIS — M25552 Pain in left hip: Secondary | ICD-10-CM | POA: Diagnosis not present

## 2019-08-18 ENCOUNTER — Telehealth: Payer: Self-pay | Admitting: Rheumatology

## 2019-08-18 DIAGNOSIS — M25552 Pain in left hip: Secondary | ICD-10-CM | POA: Diagnosis not present

## 2019-08-18 DIAGNOSIS — M25652 Stiffness of left hip, not elsewhere classified: Secondary | ICD-10-CM | POA: Diagnosis not present

## 2019-08-18 NOTE — Telephone Encounter (Signed)
They do not treat/ or evaluate for EDS. Per their doctor, a  Dr. Merrilee Seashore with [facility] does this type of Evaluation.

## 2019-08-23 DIAGNOSIS — M25552 Pain in left hip: Secondary | ICD-10-CM | POA: Diagnosis not present

## 2019-08-25 DIAGNOSIS — M25552 Pain in left hip: Secondary | ICD-10-CM | POA: Diagnosis not present

## 2019-08-30 DIAGNOSIS — M25552 Pain in left hip: Secondary | ICD-10-CM | POA: Diagnosis not present

## 2019-08-30 DIAGNOSIS — M25652 Stiffness of left hip, not elsewhere classified: Secondary | ICD-10-CM | POA: Diagnosis not present

## 2019-09-06 ENCOUNTER — Ambulatory Visit: Payer: BC Managed Care – PPO | Admitting: Sports Medicine

## 2019-09-06 ENCOUNTER — Encounter: Payer: Self-pay | Admitting: Sports Medicine

## 2019-09-06 ENCOUNTER — Other Ambulatory Visit: Payer: Self-pay

## 2019-09-06 VITALS — BP 110/78 | Ht 66.0 in | Wt 138.0 lb

## 2019-09-06 DIAGNOSIS — M2550 Pain in unspecified joint: Secondary | ICD-10-CM | POA: Diagnosis not present

## 2019-09-06 DIAGNOSIS — Q7962 Hypermobile Ehlers-Danlos syndrome: Secondary | ICD-10-CM | POA: Diagnosis not present

## 2019-09-06 DIAGNOSIS — M25552 Pain in left hip: Secondary | ICD-10-CM | POA: Diagnosis not present

## 2019-09-06 MED ORDER — NORTRIPTYLINE HCL 25 MG PO CAPS: 25.0000 mg | ORAL_CAPSULE | Freq: Two times a day (BID) | ORAL | 2 refills | Status: DC

## 2019-09-06 NOTE — Patient Instructions (Signed)
Please start the nortriptyline at night for the first week, then can gradually start taking in the morning and night.  Be aware that the bottle will be written for twice a day just to make it easier for the pharmacy. Start over-the-counter vitamin C 500 mg a day Please start neck exercises that were taught to you Please start shoulder exercises that were taught to you As your left hip continues to heal we will further evaluate your back and core Follow-up with [place] in 8-12 weeks or sooner if symptoms worsen

## 2019-09-06 NOTE — Progress Notes (Signed)
Virtual Visit via Video Note   This visit type was conducted due to national recommendations for restrictions regarding the COVID-19 Pandemic (e.g. social distancing) in an effort to limit this patient's exposure and mitigate transmission in our community.  Due to her co-morbid illnesses, this patient is at least at moderate risk for complications without adequate follow up.  This format is felt to be most appropriate for this patient at this time.  All issues noted in this document were discussed and addressed.  A limited physical exam was performed with this format.  Please refer to the patient's chart for her consent to telehealth for [facility].   Date:  09/08/2019   ID:  SEE OMALLEY, DOB 1985-08-26, MRN [id number]  Patient Location: Home Provider Location: Home  PCP:  Patient, No Pcp Per  Cardiologist:  Evalina Field, MD  Electrophysiologist:  None   Evaluation Performed:  Follow-Up Visit  Chief Complaint:  POTS  History of Present Illness:    Betty Lewis is a 34 y.o. female with POTS who presents for follow-up. 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 Has been referred for genetic testing. Was started on propranolol for POTS-like symptoms. She reports that propranolol did not help her. She noticed no difference in symptoms. Has started back on pindolol. Reports overall symptoms are much improved. She has less frequent symptoms 2-3 days per week. No more heart racing while on pindolol. She reports frequent standing can result in some light-headed symptoms/dizziness but no syncope. She reports pindolol is expensive for her. She has some metoprolol tartrate that she would like to try. She can take this 3-4 times daily. There were issues with symptoms occurring more in the afternoon. Still getting over ankle surgery. No that active yet. Staying well hydrated. I informed her I would like her to get more active as she heals from surgery. Denies CP, SOB,  palpitations today. We also discussed that her mom had a bicuspid AoV and aorta repair at 50. She has tricuspid valve and normal Ao. 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 Will need periodic screening.   Problem List 1. POTS -negative zio patch for arrhythmia  2. Ehlers Danlos  -Ao Root 2.7 cm 06/16/2019 Echo -tricuspid AoV -mom with bicuspid AoV and ascending aorta repair at 60  The patient does not have symptoms concerning for COVID-19 infection (fever, chills, cough, or new shortness of breath).    Past Medical History:  Diagnosis Date  . Bulging lumbar disc    X 2  . PONV (postoperative nausea and vomiting)    Past Surgical History:  Procedure Laterality Date  . HIP ARTHROSCOPY Left 07/28/2019  . HYSTEROSCOPY N/A 11/06/2017   Procedure: HYSTEROSCOPY;  Surgeon: Janyth Pupa, DO;  Location: [facility];  Service: Gynecology;  Laterality: N/A;  . IUD REMOVAL N/A 11/06/2017   Procedure: INTRAUTERINE DEVICE (IUD) REMOVAL;  Surgeon: Janyth Pupa, DO;  Location: [facility];  Service: Gynecology;  Laterality: N/A;  . LEEP  2013  . TONSILLECTOMY  2005  . WISDOM TOOTH EXTRACTION  2004     Current Meds  Medication Sig  . nortriptyline (PAMELOR) 25 MG capsule Take 1 capsule (25 mg total) by mouth 2 (two) times daily.  . pindolol (VISKEN) 5 MG tablet Take by mouth daily.     Allergies:   Codeine   Social History   Tobacco Use  . Smoking status: Never Smoker  . Smokeless tobacco: Never Used  Substance  Use Topics  . Alcohol use: Not Currently  . Drug use: Never     Family Hx: The patient's family history includes CAD in her mother; Diabetes in her mother; Heart disease in her mother.  ROS:   Please see the history of present illness.     All other systems reviewed and are negative.   Prior CV studies:   The following studies were reviewed today:  TTE 06/16/2019 1. Left ventricular ejection fraction, by visual estimation, is 60 to  65%. The left ventricle has normal  function. There is no left ventricular  hypertrophy.  2. Global right ventricle has normal systolic function.The right  ventricular size is normal. No increase in right ventricular wall  thickness.  3. Left atrial size was normal.  4. Right atrial size was normal.  5. The mitral valve is normal in structure. No evidence of mitral valve  regurgitation. No evidence of mitral stenosis.  6. The tricuspid valve is normal in structure. Tricuspid valve  regurgitation is not demonstrated.  7. The aortic valve is tricuspid. Aortic valve regurgitation is not  visualized. No evidence of aortic valve sclerosis or stenosis.  8. The pulmonic valve was normal in structure. Pulmonic valve  regurgitation is not visualized.  9. Normal pulmonary artery systolic pressure.  10. The inferior vena cava is normal in size with greater than 50%  respiratory variability, suggesting right atrial pressure of 3 mmHg.   Labs/Other Tests and Data Reviewed:    EKG:  No ECG reviewed.  Recent Labs: No results found for requested labs within last 8760 hours.   Recent Lipid Panel No results found for: CHOL, TRIG, HDL, CHOLHDL, LDLCALC, LDLDIRECT  Wt Readings from Last 3 Encounters:  09/08/19 140 lb (63.5 kg)  09/06/19 138 lb (62.6 kg)  08/12/19 138 lb (62.6 kg)     Objective:    Vital Signs:  BP 97/74   Pulse 100   Ht 5\' 6"  (1.676 m)   Wt 140 lb (63.5 kg)   BMI 22.60 kg/m    VITAL SIGNS:  reviewed  Gen: NAD Pulm: Talking full setences Psych: normal mood/affect   ASSESSMENT & PLAN:    1. POTS (postural orthostatic tachycardia syndrome)ns -symptoms much better on beta blocker. She will try metoprolol as this is cheaper. Not better with propranolol. Did notice improvement with pindolol but high cost is an issue. She wills stay hydrated and as she heals from surgery of her ankle, start to exercise more. Given that symptoms have improved dramatically with BB, I would like to give her more time to  become more active with stationary exercise (rowing, exercise bike) before we jump to mineralcorticoids. Overall, doing much better. Another option is to try higher dose propranolol. She will let me know how things go, but appears to be doing better.   2. Ehlers-Danlos  -recent diagnosis. Tricuspid AoV with normal Ao. Mom with bicuspid AoV and Ao repair. We will plan to periodically monitor her aorta.    COVID-19 Education: The signs and symptoms of COVID-19 were discussed with the patient and how to seek care for testing (follow up with PCP or arrange E-visit).  The importance of social distancing was discussed today.  Time:   Today, I have spent 35 minutes with the patient with telehealth technology discussing the above problems.     Medication Adjustments/Labs and Tests Ordered: Current medicines are reviewed at length with the patient today.  Concerns regarding medicines are outlined above.   Tests Ordered:  No orders of the defined types were placed in this encounter.   Medication Changes: Meds ordered this encounter  Medications  . metoprolol tartrate (LOPRESSOR) 25 MG tablet    Sig: Take 0.5 tablets (12.5 mg total) by mouth 2 (two) times daily.    Dispense:  180 tablet    Refill:  3    Follow Up:  In Person in 4 month(s)  Signed, Evalina Field, MD  09/08/2019 9:35 AM    [place]

## 2019-09-06 NOTE — Progress Notes (Signed)
   [facility] [address] 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  Over the last few years different providers have expressed concern for possible EDS.  She was diagnosed with POTS by a cardiologist, then sent to rheumatology who diagnosed her with EDS, who then referred her to [place].  Her symptoms started in the left hip, subsequently having a labral arthroscopy 1 month ago.  She also has a known torn right shoulder SLAP tear.  She has been dealing with low back pain for 10 years, with known bulging disks diagnosed by imaging 5 years ago.  She also has neck pain.  She recalls frequent falls when she was younger.  She enjoys walking and playing tennis.  Systemically, she has been diagnosed with IBS and has not been able to produce vomitus since 2006.  She is requiring NyQuil every night in order to get pain-free sleep.  She also showed [place] pictures on her phone of easy bruisability.     ROS:     See HPI. CV + with orthostatic tachycardia GI with irritability at times but now stable Neck pain   PERTINENT  PMH / PSH / FH / SH:  Past Medical, Surgical, Social, and Family History Reviewed & Updated in the EMR. Pertinent findings include:  Elbow dislocation, likely family history of mom having EDS after multiple joint surgeries at a young age   OBJECTIVE:  BP 110/78   Ht 5\' 6"  (1.676 m)   Wt 138 lb (62.6 kg)   BMI 22.27 kg/m  Physical Exam:  Vital signs are reviewed.   GEN: Alert and oriented, NAD Pulm: Breathing unlabored PSY: normal mood, congruent affect  MSK: Hyperextension of elbows Hyperextension of knee Patient is wearing labral brace so unable to perform  full testing on left leg and core Hypermobility of cervical spine Brighton score unable to be fully assessed given the recent hip surgery, but is already at 7 points without the left leg and hands to floor Strength is preserved in bilateral upper extremities Reflexes are intact bilaterally neurovascularly intact distally   ASSESSMENT & PLAN:   1. 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  We will start her on isometric neck exercises.  She was also taught single-leg quad strengthening and balance exercises for her ankles.  Trial of vitamin C daily for the easy bruising.  We have also prescribed nortriptyline 25 mg to gradually increase to bid trial for a month.  We will see her back in 8-12 weeks for further reassessment and to check progress.   Lanier Clam, DO, ATC Sports Medicine Fellow  I observed and examined the patient with Dr. Kathrynn Speed and agree with assessment and plan.  Note reviewed and modified by me. I spent 32 minutes with this patient. Over 50% of visit was spend in counseling and coordination of care for problems with multiple painful joints. Ila Mcgill, MD

## 2019-09-06 NOTE — Assessment & Plan Note (Signed)
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

## 2019-09-08 ENCOUNTER — Encounter: Payer: Self-pay | Admitting: Cardiovascular Disease

## 2019-09-08 ENCOUNTER — Telehealth (INDEPENDENT_AMBULATORY_CARE_PROVIDER_SITE_OTHER): Payer: BC Managed Care – PPO | Admitting: Cardiovascular Disease

## 2019-09-08 VITALS — BP 97/74 | HR 100 | Ht 66.0 in | Wt 140.0 lb

## 2019-09-08 DIAGNOSIS — R002 Palpitations: Secondary | ICD-10-CM | POA: Diagnosis not present

## 2019-09-08 DIAGNOSIS — G90A Postural orthostatic tachycardia syndrome (POTS): Secondary | ICD-10-CM

## 2019-09-08 DIAGNOSIS — I498 Other specified cardiac arrhythmias: Secondary | ICD-10-CM

## 2019-09-08 DIAGNOSIS — R42 Dizziness and giddiness: Secondary | ICD-10-CM | POA: Diagnosis not present

## 2019-09-08 DIAGNOSIS — Q796 Ehlers-Danlos syndrome, unspecified: Secondary | ICD-10-CM

## 2019-09-08 DIAGNOSIS — M25552 Pain in left hip: Secondary | ICD-10-CM | POA: Diagnosis not present

## 2019-09-08 MED ORDER — METOPROLOL TARTRATE 25 MG PO TABS: 12.5000 mg | ORAL_TABLET | Freq: Two times a day (BID) | ORAL | 3 refills | Status: DC

## 2019-09-08 NOTE — Patient Instructions (Signed)
Medication Instructions:  Stop Propranolol  Start Metoprolol Tartrate 12.5 mg twice daily   *If you need a refill on your cardiac medications before your next appointment, please call your pharmacy*  Follow-Up: At [facility], you and your health needs are our priority.  As part of our continuing mission to provide you with exceptional heart care, we have created designated Provider Care Teams.  These Care Teams include your primary Cardiologist (physician) and Advanced Practice Providers (APPs -  Physician Assistants and Nurse Practitioners) who all work together to provide you with the care you need, when you need it.  Your next appointment:   4 month(s)  The format for your next appointment:   In Person  Provider:   Eleonore Chiquito, MD

## 2019-09-13 DIAGNOSIS — M25552 Pain in left hip: Secondary | ICD-10-CM | POA: Diagnosis not present

## 2019-09-15 DIAGNOSIS — M25552 Pain in left hip: Secondary | ICD-10-CM | POA: Diagnosis not present

## 2019-09-20 DIAGNOSIS — M25552 Pain in left hip: Secondary | ICD-10-CM | POA: Diagnosis not present

## 2019-09-20 DIAGNOSIS — M25652 Stiffness of left hip, not elsewhere classified: Secondary | ICD-10-CM | POA: Diagnosis not present

## 2019-09-22 DIAGNOSIS — M25552 Pain in left hip: Secondary | ICD-10-CM | POA: Diagnosis not present

## 2019-09-27 ENCOUNTER — Other Ambulatory Visit: Payer: Self-pay

## 2019-09-27 ENCOUNTER — Encounter: Payer: Self-pay | Admitting: Sports Medicine

## 2019-09-27 ENCOUNTER — Ambulatory Visit (INDEPENDENT_AMBULATORY_CARE_PROVIDER_SITE_OTHER): Payer: BC Managed Care – PPO | Admitting: Sports Medicine

## 2019-09-27 VITALS — BP 102/72 | Ht 66.0 in | Wt 140.0 lb

## 2019-09-27 DIAGNOSIS — M25552 Pain in left hip: Secondary | ICD-10-CM | POA: Diagnosis not present

## 2019-09-27 DIAGNOSIS — Z72821 Inadequate sleep hygiene: Secondary | ICD-10-CM | POA: Diagnosis not present

## 2019-09-27 DIAGNOSIS — M25652 Stiffness of left hip, not elsewhere classified: Secondary | ICD-10-CM | POA: Diagnosis not present

## 2019-09-27 MED ORDER — TRAMADOL HCL 50 MG PO TABS: 50.0000 mg | ORAL_TABLET | Freq: Four times a day (QID) | ORAL | 0 refills | Status: DC | PRN

## 2019-09-27 MED ORDER — GABAPENTIN 300 MG PO CAPS: 300.0000 mg | ORAL_CAPSULE | Freq: Every day | ORAL | 1 refills | Status: DC

## 2019-09-27 NOTE — Patient Instructions (Signed)
Please start using fiber supplementation twice a day If any constipation, start with MiraLAX or Dulcolax We have prescribed gabapentin 300 mg to take every night, do not expect any results for the first 7-10 days Please try to keep a symptom journal Just as a reminder, 300 mg of gabapentin is the lowest dose so we have plenty of room to go up as well

## 2019-09-27 NOTE — Progress Notes (Signed)
   [place] [address] [place], [place] [address] Phone: [phone] Fax: [phone]   Patient Name: Betty Lewis Date of Birth: 1986-03-27 Medical Record Number: [id number] Gender: female Date of Encounter: 09/27/2019  SUBJECTIVE:      Chief Complaint:  Cognitive defect   HPI:  Betty Lewis is one of our EDS patients following up for difficulties with concentration, sleep, and hyperstimulation.  She is currently using NyQuil nightly and has been for over 2 years in order to fall asleep.  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  UnFortunately, she did not tolerate nortriptyline, suffered constipation.  She usually falls asleep around 11 PM but is awake at 3 AM and then falls back asleep at 5 AM and then up for the day by 8:30 AM.  Regards to her left hip, she is out of the brace and doing rehab.  Unfortunately it is still painful to lay on her left hip and right shoulder, so finding a position of comfort is difficult.     ROS:     See HPI.   PERTINENT  PMH / PSH / FH / SH:  Past Medical, Surgical, Social, and Family History Reviewed & Updated in the EMR. Pertinent findings include:  She has tried gabapentin in the past but it has been a long time and she was not taking it at night   OBJECTIVE:  BP 102/72   Ht 5\' 6"  (1.676 m)   Wt 140 lb (63.5 kg)   BMI 22.60 kg/m  Physical Exam:  Vital signs are reviewed.   GEN: Alert and oriented, NAD Pulm: Breathing unlabored PSY: normal mood, congruent affect  ASSESSMENT & PLAN:   1. Poor sleep hygiene  We will trial gabapentin 300 mg nightly.  In addition we recommended starting a daily fiber supplement.  If she does have any constipation, she can try MiraLAX or Dulcolax.  We reiterated that dizziness, mood swings, and grogginess is a side effect of gabapentin.  Please keep a symptom journal so we can  better understand where episodes may be coming from.   Lanier Clam, DO, ATC Sports Medicine Fellow  I observed and examined the patient with Dr. Kathrynn Speed and agree with assessment and plan.  Note reviewed and modified by me.  Ila Mcgill, MD

## 2019-09-29 DIAGNOSIS — M25552 Pain in left hip: Secondary | ICD-10-CM | POA: Diagnosis not present

## 2019-09-29 DIAGNOSIS — M25652 Stiffness of left hip, not elsewhere classified: Secondary | ICD-10-CM | POA: Diagnosis not present

## 2019-10-05 DIAGNOSIS — M25552 Pain in left hip: Secondary | ICD-10-CM | POA: Diagnosis not present

## 2019-10-07 DIAGNOSIS — M25552 Pain in left hip: Secondary | ICD-10-CM | POA: Diagnosis not present

## 2019-10-13 ENCOUNTER — Other Ambulatory Visit: Payer: Self-pay | Admitting: Cardiovascular Disease

## 2019-10-13 DIAGNOSIS — I498 Other specified cardiac arrhythmias: Secondary | ICD-10-CM

## 2019-10-13 DIAGNOSIS — G90A Postural orthostatic tachycardia syndrome (POTS): Secondary | ICD-10-CM

## 2019-10-13 MED ORDER — IVABRADINE HCL 5 MG PO TABS: 5.0000 mg | ORAL_TABLET | Freq: Two times a day (BID) | ORAL | Status: DC

## 2019-10-13 NOTE — Progress Notes (Signed)
Called in Ivabradine 5 mg BID.   [place] T. Audie Box, [place]  [address] [place], [place] [address] [phone]  9:43 PM

## 2019-10-18 DIAGNOSIS — M25552 Pain in left hip: Secondary | ICD-10-CM | POA: Diagnosis not present

## 2019-10-19 ENCOUNTER — Other Ambulatory Visit: Payer: Self-pay

## 2019-10-19 MED ORDER — IVABRADINE HCL 5 MG PO TABS: 5.0000 mg | ORAL_TABLET | Freq: Two times a day (BID) | ORAL | 2 refills | Status: DC

## 2019-10-20 DIAGNOSIS — M25552 Pain in left hip: Secondary | ICD-10-CM | POA: Diagnosis not present

## 2019-10-20 DIAGNOSIS — M25652 Stiffness of left hip, not elsewhere classified: Secondary | ICD-10-CM | POA: Diagnosis not present

## 2019-10-24 DIAGNOSIS — M25552 Pain in left hip: Secondary | ICD-10-CM | POA: Diagnosis not present

## 2019-10-26 ENCOUNTER — Other Ambulatory Visit: Payer: Self-pay | Admitting: *Deleted

## 2019-10-26 MED ORDER — TRAMADOL HCL 50 MG PO TABS: ORAL_TABLET | ORAL | 0 refills | Status: DC

## 2019-10-28 DIAGNOSIS — M25552 Pain in left hip: Secondary | ICD-10-CM | POA: Diagnosis not present

## 2019-11-04 DIAGNOSIS — M25552 Pain in left hip: Secondary | ICD-10-CM | POA: Diagnosis not present

## 2019-11-04 DIAGNOSIS — M25652 Stiffness of left hip, not elsewhere classified: Secondary | ICD-10-CM | POA: Diagnosis not present

## 2019-11-21 ENCOUNTER — Telehealth: Payer: Self-pay | Admitting: Cardiology

## 2019-11-21 DIAGNOSIS — I498 Other specified cardiac arrhythmias: Secondary | ICD-10-CM

## 2019-11-21 DIAGNOSIS — G90A Postural orthostatic tachycardia syndrome (POTS): Secondary | ICD-10-CM

## 2019-11-21 NOTE — Telephone Encounter (Signed)
If patient wishes to change MDs then it would be better to have her see Dr. Caryl Comes who specializes in [place] and not me.  Carlo Lorson

## 2019-11-21 NOTE — Telephone Encounter (Signed)
That is fine with me. -W 

## 2019-11-21 NOTE — Telephone Encounter (Signed)
Patient would like to switch from Dr. Audie Box to Dr. Radford Pax. Are you both in agreement?

## 2019-11-30 NOTE — Telephone Encounter (Signed)
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 Caryl Comes to follow her for that

## 2019-11-30 NOTE — Telephone Encounter (Signed)
Good morning,   I spoke w/Dr. Aquilla Hacker nurse about this patient. Will you see this patient for general cardiology? We can schedule her with Dr. Caryl Comes, she just needs to be referred.

## 2019-12-02 ENCOUNTER — Other Ambulatory Visit: Payer: Self-pay | Admitting: *Deleted

## 2019-12-02 MED ORDER — GABAPENTIN 300 MG PO CAPS: 300.0000 mg | ORAL_CAPSULE | Freq: Every day | ORAL | 1 refills | Status: DC

## 2019-12-06 DIAGNOSIS — M25552 Pain in left hip: Secondary | ICD-10-CM | POA: Diagnosis not present

## 2019-12-06 DIAGNOSIS — Z9889 Other specified postprocedural states: Secondary | ICD-10-CM | POA: Diagnosis not present

## 2020-01-05 ENCOUNTER — Other Ambulatory Visit: Payer: Self-pay

## 2020-01-05 ENCOUNTER — Telehealth: Payer: Self-pay | Admitting: *Deleted

## 2020-01-05 ENCOUNTER — Encounter: Payer: Self-pay | Admitting: Cardiology

## 2020-01-05 ENCOUNTER — Telehealth (INDEPENDENT_AMBULATORY_CARE_PROVIDER_SITE_OTHER): Payer: BC Managed Care – PPO | Admitting: Cardiology

## 2020-01-05 VITALS — HR 101 | Ht 66.0 in | Wt 145.0 lb

## 2020-01-05 DIAGNOSIS — Q796 Ehlers-Danlos syndrome, unspecified: Secondary | ICD-10-CM | POA: Diagnosis not present

## 2020-01-05 DIAGNOSIS — I498 Other specified cardiac arrhythmias: Secondary | ICD-10-CM

## 2020-01-05 DIAGNOSIS — G90A Postural orthostatic tachycardia syndrome (POTS): Secondary | ICD-10-CM

## 2020-01-05 NOTE — Telephone Encounter (Signed)
Left message for patient to call regarding Cardiac MRI/MRA ordered by Dr. Radford Pax

## 2020-01-05 NOTE — Patient Instructions (Signed)
Medication Instructions:  Your physician recommends that you continue on your current medications as directed. Please refer to the Current Medication list given to you today.  *If you need a refill on your cardiac medications before your next appointment, please call your pharmacy*   Testing/Procedures: Your physician has requested that you have a cardiac MRI. Cardiac MRI uses a computer to create images of your heart as its beating, producing both still and moving pictures of your heart and major blood vessels. For further information please visit [email or web address] Please follow the instruction sheet given to you today for more information.  Follow-Up: At [facility], you and your health needs are our priority.  As part of our continuing mission to provide you with exceptional heart care, we have created designated Provider Care Teams.  These Care Teams include your primary Cardiologist (physician) and Advanced Practice Providers (APPs -  Physician Assistants and Nurse Practitioners) who all work together to provide you with the care you need, when you need it.   Your next appointment:   1 year(s)  The format for your next appointment:   In Person  Provider:   You may see Fransico Him, MD or one of the following Advanced Practice Providers on your designated Care Team:    Melina Copa, PA-C  Ermalinda Barrios, PA-C

## 2020-01-08 NOTE — Progress Notes (Signed)
Virtual Visit via Video Note   This visit type was conducted due to national recommendations for restrictions regarding the COVID-19 Pandemic (e.g. social distancing) in an effort to limit this patient's exposure and mitigate transmission in our community.  Due to her co-morbid illnesses, this patient is at least at moderate risk for complications without adequate follow up.  This format is felt to be most appropriate for this patient at this time.  All issues noted in this document were discussed and addressed.  A limited physical exam was performed with this format.  Please refer to the patient's chart for her consent to telehealth for [facility].  Evaluation Performed:  Follow-up visit  This visit type was conducted due to national recommendations for restrictions regarding the COVID-19 Pandemic (e.g. social distancing).  This format is felt to be most appropriate for this patient at this time.  All issues noted in this document were discussed and addressed.  No physical exam was performed (except for noted visual exam findings with Video Visits).  Please refer to the patient's chart (MyChart message for video visits and phone note for telephone visits) for the patient's consent to telehealth for [facility].  Date:  01/08/2020   ID:  Betty Lewis, DOB [birth year], MRN [id number]  Patient Location:  [facility]  Provider location:   [facility]  PCP:  Patient, No Pcp Per  Cardiologist:  Fransico Him, MD Electrophysiologist:  Virl Axe, MD   Chief Complaint:  Dizziness, syncope   History of Present Illness:    Betty Lewis is a 34 y.o. female who presents via audio/video conferencing for a telehealth visit today.    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 Farris Has in February but requested to be seen by another MD.  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 Caryl Comes for her POTS-like symptoms. She was evaluated by Rheumatology who feels  she meets criteria for EDS and was referred for genetic testing. She was started on propranolol for POTS-like symptoms but there was no improvement in her symptoms. She was changed to Pindolol with overall symptoms improved with no more heart racing  Unfortunately the Pindolol was cost prohibitive and tried Metoprolol tartrate. She wore a Ziopatch for palpitations which was normal.  She was encouraged to exercise but due to her EDS, she inured her ankle requiring surgery.   She tries to drink at least 64oz of fluids daily.  Her mom had a bicuspid AoV and aorta repair at 56. She has tricuspid valve and normal Ao. 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   She is here for video visit today.  Still has some issues with her heart racing at times and dizziness when standing for too long.  She has not been able to exercise due to still recovering from ankle surgery.  She denies any chest pain or pressure, SOB, DOE.    Prior CV studies:   The following studies were reviewed today:  2D echo  Past Medical History:  Diagnosis Date  . Bulging lumbar disc    X 2  . PONV (postoperative nausea and vomiting)    Past Surgical History:  Procedure Laterality Date  . HIP ARTHROSCOPY Left 07/28/2019  . HYSTEROSCOPY N/A 11/06/2017   Procedure: HYSTEROSCOPY;  Surgeon: Janyth Pupa, DO;  Location: [facility];  Service: Gynecology;  Laterality: N/A;  . IUD REMOVAL N/A 11/06/2017   Procedure: INTRAUTERINE DEVICE (  IUD) REMOVAL;  Surgeon: Janyth Pupa, DO;  Location: [facility];  Service: Gynecology;  Laterality: N/A;  . LEEP  2013  . TONSILLECTOMY  2005  . WISDOM TOOTH EXTRACTION  2004     Current Meds  Medication Sig  . levonorgestrel-ethinyl estradiol (ALESSE) 0.1-20 MG-MCG tablet Take 1 tablet by mouth daily.  . pindolol (VISKEN) 5 MG tablet Take by mouth 2 (two) times daily.   . traMADol (ULTRAM) 50 MG tablet Take 1-2 tabs as needed for pain     Allergies:   Codeine   Social History   Tobacco Use  .  Smoking status: Never Smoker  . Smokeless tobacco: Never Used  Vaping Use  . Vaping Use: Never used  Substance Use Topics  . Alcohol use: Not Currently  . Drug use: Never     Family Hx: The patient's family history includes CAD in her mother; Diabetes in her mother; Heart disease in her mother.  ROS:   Please see the history of present illness.     All other systems reviewed and are negative.   Labs/Other Tests and Data Reviewed:    Recent Labs: No results found for requested labs within last 8760 hours.   Recent Lipid Panel No results found for: CHOL, TRIG, HDL, CHOLHDL, LDLCALC, LDLDIRECT  Wt Readings from Last 3 Encounters:  01/05/20 145 lb (65.8 kg)  09/27/19 140 lb (63.5 kg)  09/08/19 140 lb (63.5 kg)     Objective:    Vital Signs:  Pulse (!) 101   Ht 5\' 6"  (1.676 m)   Wt 145 lb (65.8 kg)   BMI 23.40 kg/m    CONSTITUTIONAL:  Well nourished, well developed female in no acute distress.  EYES: anicteric MOUTH: oral mucosa is pink RESPIRATORY: Normal respiratory effort, symmetric expansion CARDIOVASCULAR: No peripheral edema SKIN: No rash, lesions or ulcers MUSCULOSKELETAL: no digital cyanosis NEURO: Cranial Nerves II-XII grossly intact, moves all extremities PSYCH: Intact judgement and insight.  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  POTS syndrome -symptoms improved on Pindolol  -she tries to stay well hydrated -encouraged her to try to get back into exercise as her hip will allow as this will be one of the most beneficial things in  treating her POTS -she has been referred to Dr. Caryl Comes  COVID-19 Education: The signs and symptoms of COVID-19 were discussed with the patient and how to seek care for testing (follow up with PCP or arrange E-visit).  The importance of social distancing was discussed today.  Patient Risk:   After full review of this patient's clinical status, I feel that they are at least moderate risk at this time.  Time:   Today, I have spent 25 minutes on telemedicine discussing medical problems including Ehler-Danlos Syndrome and POTS and reviewing patient's chart including 2D echo, Sports medicine notes.  Medication Adjustments/Labs and Tests Ordered: Current medicines are reviewed at length with the patient today.  Concerns regarding medicines are outlined above.  Tests Ordered: Orders Placed This Encounter  Procedures  . MR CARDIAC MORPHOLOGY W  WO CONTRAST  . MR Angiogram Chest W Contrast  . Basic metabolic panel   Medication Changes: No orders of the defined types were placed in this encounter.   Disposition:  Follow up in 1 year(s)  Signed, Fransico Him, MD  01/08/2020 9:45 PM    [facility]

## 2020-01-10 ENCOUNTER — Other Ambulatory Visit: Payer: Self-pay | Admitting: Sports Medicine

## 2020-01-10 MED ORDER — TRAMADOL HCL 50 MG PO TABS: ORAL_TABLET | ORAL | 1 refills | Status: DC

## 2020-01-11 ENCOUNTER — Telehealth: Payer: Self-pay | Admitting: *Deleted

## 2020-01-11 ENCOUNTER — Encounter: Payer: Self-pay | Admitting: *Deleted

## 2020-01-11 NOTE — Telephone Encounter (Signed)
Spoke with patient Betty Lewis regarding appointment for Cardiac Mri scheduled 01/26/20 at 10:00 am at [facility]--arrival time is 9:30 am 1st floor admissions office---will mail information to patient and she voiced her understanding.

## 2020-01-12 NOTE — Addendum Note (Signed)
Addended by: Antonieta Iba on: 01/12/2020 07:57 AM   Modules accepted: Orders

## 2020-01-17 ENCOUNTER — Other Ambulatory Visit: Payer: Self-pay

## 2020-01-17 ENCOUNTER — Ambulatory Visit
Admission: RE | Admit: 2020-01-17 | Discharge: 2020-01-17 | Disposition: A | Discharge status: Home / Self Care | Payer: BC Managed Care – PPO | Source: Ambulatory Visit | Attending: Sports Medicine | Admitting: Sports Medicine

## 2020-01-17 ENCOUNTER — Ambulatory Visit (INDEPENDENT_AMBULATORY_CARE_PROVIDER_SITE_OTHER): Payer: BC Managed Care – PPO | Admitting: Sports Medicine

## 2020-01-17 VITALS — BP 81/58 | Ht 66.0 in | Wt 145.0 lb

## 2020-01-17 DIAGNOSIS — Q7962 Hypermobile Ehlers-Danlos syndrome: Secondary | ICD-10-CM

## 2020-01-17 DIAGNOSIS — M25551 Pain in right hip: Secondary | ICD-10-CM | POA: Diagnosis not present

## 2020-01-17 DIAGNOSIS — G90A Postural orthostatic tachycardia syndrome (POTS): Secondary | ICD-10-CM | POA: Insufficient documentation

## 2020-01-17 DIAGNOSIS — I498 Other specified cardiac arrhythmias: Secondary | ICD-10-CM

## 2020-01-17 DIAGNOSIS — M24152 Other articular cartilage disorders, left hip: Secondary | ICD-10-CM | POA: Diagnosis not present

## 2020-01-17 DIAGNOSIS — Z8739 Personal history of other diseases of the musculoskeletal system and connective tissue: Secondary | ICD-10-CM | POA: Diagnosis not present

## 2020-01-17 MED ORDER — METHOCARBAMOL 500 MG PO TABS: 500.0000 mg | ORAL_TABLET | Freq: Four times a day (QID) | ORAL | 1 refills | Status: DC

## 2020-01-17 NOTE — Assessment & Plan Note (Signed)
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

## 2020-01-17 NOTE — Progress Notes (Signed)
  Betty Lewis - 34 y.o. female MRN [id number]  Date of birth: 1985-08-01    SUBJECTIVE:    Pleasant 34 year old presents for follow up of hip pain  Chief Complaint:/ HPI:  Hip pains: she has a history of bilateral hip pains. Had a previous left labral tear that was repaired with 2 anchors, shaved head of femur. Also has bilateral leg posterior cramping, she notices this when she wakes up, it is NOT waking her up out of sleep.  She also particularly notices the posterior leg cramping when she slides down from a surface (for example out of a tall car onto the ground).  POTS for which she takes Pindolol 5mg  BID (HR still hits 125 bpm), her HR does not usually go below the 80s. Followed by cardiology Echocardiogram was normal, Rheumatologist "suspect EDS".  Migraines: no history of such TMJ issues: Mom has EDS and TMJ issues, patient grinds her teeth a lot but does not have known TMJ issues.    Abdominal pain: starting to have stabbing pain in epigastric region that was intermittent 10 seconds on then 10 seconds off. Unable to throw up. Has had diarrhea now for the last 2-3 days, no constipation. Denies any black tarry stools.    ROS:     See HPI  PERTINENT  PMH / PSH FH / / SH:  Past Medical, Surgical, Social, and Family History Reviewed & Updated in the EMR.  Pertinent findings include:  Patient Active Problem List   Diagnosis Date Noted  . Ehlers-Danlos syndrome type III 09/06/2019    Mother also has DX of EDS  OBJECTIVE: BP (!) 81/58   Ht 5\' 6"  (1.676 m)   Wt 145 lb (65.8 kg)   BMI 23.40 kg/m   Physical Exam:  Vital signs are reviewed.  GEN: Alert and oriented, NAD Pulm: Breathing unlabored PSY: normal mood, congruent affect  MSK: hyperextension of bilateral elbows and knees, hypermobility appreciated in bilateral thumbs and pinkies. Hyperextensibility appreciated in lumbar spine with motion  Left hip range of motion: 80 degrees external rotation, 45 degrees internal  rotation Right hip ROM: 85 degrees external rotation, 45 degrees internal rotation, FADIR bothersome Strength was good Integumentary: satin skin appreciated  ASSESSMENT & PLAN:  1. R Hip Pain: -Hip Xray at your earliest convenience. -In the morning perform the following exercises: Knee to chest, knee to opposite shoulder, pelvic tilt exercises before getting out of bed (5 times each for each exercises to each side).  Isometric abdominal muscle contraction x5. -Perform standing pelvic tilt exercises against a wall for 10 seconds each 5 times.  -Robaxin 500mg  twice daily, may cause mild drowsiness.   2. Abdominal Pains:KB Fields, MD -Can take Tums 3 times daily for abdominal discomfort and cramping.   Milus Banister, [place], PGY-3 01/17/2020 11:25 AM   I observed and examined the patient with the resident and agree with assessment and plan.  Note reviewed and modified by me. Ila Mcgill, MD

## 2020-01-17 NOTE — Patient Instructions (Addendum)
Thank you for coming in to see [place] today! Please see below to review our plan for today's visit:  1. Go and get the hip Xray at your earliest convenience. 2. In the morning perform the following exercises: Knee to chest, knee to opposite shoulder, pelvic tilt exercises before getting out of bed (5 times each for each exercises to each side). 3. Perform standing pelvic tilt exercises against a wall for 10 seconds each 5 times.  4. Robaxin 500mg  twice daily, may cause mild drowsiness.  5. Can take Tums 3 times daily for abdominal discomfort and cramping.  Please call the clinic at [phone] if your symptoms worsen or you have any concerns. It was our pleasure to serve you!  Dr. Stefanie Libel Dr. Milus Banister [facility]

## 2020-01-17 NOTE — Assessment & Plan Note (Addendum)
With RT hip pain we will check XR This was normal  Can consider MRI but on d/w patient not that interested in surgery May want to document and consider hip injection for pain

## 2020-01-18 NOTE — Addendum Note (Signed)
Addended by: Cyd Silence on: 01/18/2020 09:28 AM   Modules accepted: Orders

## 2020-01-25 ENCOUNTER — Telehealth (HOSPITAL_COMMUNITY): Payer: Self-pay | Admitting: *Deleted

## 2020-01-25 NOTE — Telephone Encounter (Signed)
Attempted to call patient regarding upcoming cardiac MRI appointment. Left message on voicemail with name and callback number  Amiel Mccaffrey Tai RN Navigator Cardiac Imaging [facility] [phone] Office [phone] Cell  

## 2020-01-26 ENCOUNTER — Ambulatory Visit (HOSPITAL_COMMUNITY): Admission: RE | Admit: 2020-01-26 | Payer: BC Managed Care – PPO | Source: Ambulatory Visit

## 2020-01-26 ENCOUNTER — Ambulatory Visit (HOSPITAL_COMMUNITY)
Admission: RE | Admit: 2020-01-26 | Discharge: 2020-01-26 | Disposition: A | Discharge status: Home / Self Care | Payer: BC Managed Care – PPO | Source: Ambulatory Visit | Attending: Cardiology | Admitting: Cardiology

## 2020-01-26 ENCOUNTER — Encounter (HOSPITAL_COMMUNITY): Payer: Self-pay | Admitting: Radiology

## 2020-01-26 ENCOUNTER — Other Ambulatory Visit: Payer: Self-pay

## 2020-01-26 DIAGNOSIS — Q796 Ehlers-Danlos syndrome, unspecified: Secondary | ICD-10-CM | POA: Diagnosis not present

## 2020-01-26 DIAGNOSIS — Z8249 Family history of ischemic heart disease and other diseases of the circulatory system: Secondary | ICD-10-CM | POA: Diagnosis not present

## 2020-01-26 DIAGNOSIS — Z8279 Family history of other congenital malformations, deformations and chromosomal abnormalities: Secondary | ICD-10-CM | POA: Diagnosis not present

## 2020-01-26 DIAGNOSIS — Q8789 Other specified congenital malformation syndromes, not elsewhere classified: Secondary | ICD-10-CM | POA: Diagnosis not present

## 2020-01-26 DIAGNOSIS — Q231 Congenital insufficiency of aortic valve: Secondary | ICD-10-CM | POA: Diagnosis not present

## 2020-01-26 MED ORDER — GADOBUTROL 1 MMOL/ML IV SOLN
7.0000 mL | Freq: Once | INTRAVENOUS | Status: AC | PRN
  Administered 2020-01-26: 7 mL via INTRAVENOUS

## 2020-01-27 DIAGNOSIS — Z0000 Encounter for general adult medical examination without abnormal findings: Secondary | ICD-10-CM | POA: Diagnosis not present

## 2020-02-06 ENCOUNTER — Ambulatory Visit: Payer: BC Managed Care – PPO | Admitting: Cardiology

## 2020-02-09 ENCOUNTER — Ambulatory Visit
Admission: RE | Admit: 2020-02-09 | Discharge: 2020-02-09 | Disposition: A | Discharge status: Home / Self Care | Payer: BC Managed Care – PPO | Source: Ambulatory Visit | Attending: Sports Medicine | Admitting: Sports Medicine

## 2020-02-09 ENCOUNTER — Other Ambulatory Visit: Payer: Self-pay

## 2020-02-09 DIAGNOSIS — M25551 Pain in right hip: Secondary | ICD-10-CM | POA: Diagnosis not present

## 2020-02-13 ENCOUNTER — Other Ambulatory Visit (HOSPITAL_COMMUNITY): Payer: BC Managed Care – PPO

## 2020-03-01 DIAGNOSIS — Z01419 Encounter for gynecological examination (general) (routine) without abnormal findings: Secondary | ICD-10-CM | POA: Diagnosis not present

## 2020-03-02 MED ORDER — PINDOLOL 5 MG PO TABS: 5.0000 mg | ORAL_TABLET | Freq: Two times a day (BID) | ORAL | 0 refills | Status: DC

## 2020-03-06 ENCOUNTER — Encounter: Payer: Self-pay | Admitting: Internal Medicine

## 2020-03-06 ENCOUNTER — Other Ambulatory Visit: Payer: Self-pay

## 2020-03-06 ENCOUNTER — Ambulatory Visit: Payer: BC Managed Care – PPO | Admitting: Internal Medicine

## 2020-03-06 VITALS — BP 120/80 | HR 82 | Ht 66.0 in | Wt 151.2 lb

## 2020-03-06 DIAGNOSIS — Q7962 Hypermobile Ehlers-Danlos syndrome: Secondary | ICD-10-CM

## 2020-03-06 DIAGNOSIS — I498 Other specified cardiac arrhythmias: Secondary | ICD-10-CM | POA: Diagnosis not present

## 2020-03-06 DIAGNOSIS — G90A Postural orthostatic tachycardia syndrome (POTS): Secondary | ICD-10-CM

## 2020-03-06 MED ORDER — ONDANSETRON HCL 4 MG PO TABS: 4.0000 mg | ORAL_TABLET | Freq: Four times a day (QID) | ORAL | 0 refills | Status: DC | PRN

## 2020-03-06 NOTE — Progress Notes (Signed)
ELECTROPHYSIOLOGY CONSULT NOTE  Patient ID: Betty Lewis, MRN: [id number], DOB/AGE: 1986/03/02 35 y.o. Admit date: (Not on file) Date of Consult: 03/06/2020  Primary Physician: Glenis Smoker, MD Primary Cardiologist: Johna Roles     Betty Lewis is a 34 y.o. female who is being seen today for the evaluation of POTS at the request of Dr Johna Roles    HPI Betty Lewis is a 34 y.o. 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  She has standing intolerance, heat intolerance, menses intolerance and shower intolerance.  Prolonged standing and showering are associated with violaceous discoloration.  Diet is replete of fluid but deplete of sodium.  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  Hip pain has significantly impaired sleep.  Poor sleep makes her this autonomic symptoms much worse.  Has worsening nausea.  Almost chronic.  Numerous other GI complaints including pain.  Also constipation which with straining can result in presyncope.  Efforts with MiraLAX were unsuccessful.  In the past sodium supplementation has been associated with bloating. In this context she has lost 30 pounds in the last couple of years.  In 2007 she took out Lexapro.  This was associated with a 30 pound weight gain in 2 weeks.  Alcohol use is scant.  Marijuana use is remote.    DATE TEST EF   12/20 Echo   55-65 %         Date Cr K Hgb  4/19 0.58 4.4 13.9  8/20    13.9      Past Medical History:  Diagnosis Date  . Bulging lumbar disc    X 2  . PONV (postoperative nausea and vomiting)       Surgical History:  Past Surgical History:  Procedure Laterality Date  . HIP ARTHROSCOPY Left 07/28/2019  . HYSTEROSCOPY N/A 11/06/2017   Procedure: HYSTEROSCOPY;  Surgeon: Janyth Pupa, DO;   Location: [facility];  Service: Gynecology;  Laterality: N/A;  . IUD REMOVAL N/A 11/06/2017   Procedure: INTRAUTERINE DEVICE (IUD) REMOVAL;  Surgeon: Janyth Pupa, DO;  Location: [facility];  Service: Gynecology;  Laterality: N/A;  . LEEP  2013  . TONSILLECTOMY  2005  . WISDOM TOOTH EXTRACTION  2004     Home Meds: Current Meds  Medication Sig  . levonorgestrel-ethinyl estradiol (ALESSE) 0.1-20 MG-MCG tablet Take 1 tablet by mouth daily.  . traMADol (ULTRAM) 50 MG tablet Take 1-2 tabs as needed for pain  . [DISCONTINUED] pindolol (VISKEN) 5 MG tablet Take 1 tablet (5 mg total) by mouth 2 (two) times daily.    Allergies:  Allergies  Allergen Reactions  . Codeine     MAKES HER DIZZY AND DISORIENTED    Social History   Socioeconomic History  . Marital status: Single    Spouse name: Not on file  . Number of children: Not on file  . Years of education: Not on file  . Highest education level: Not on file  Occupational History  . Not on file  Tobacco Use  . Smoking status: Never Smoker  . Smokeless tobacco: Never Used  Vaping Use  . Vaping Use: Never used  Substance and Sexual Activity  . Alcohol use: Not Currently  . Drug use: Never  . Sexual activity: Yes  Other Topics Concern  . Not  on file  Social History Narrative   Works at [employer]:   . Difficulty of Paying Living Expenses: Not on file  Food Insecurity:   . Worried About Charity fundraiser in the Last Year: Not on file  . Ran Out of Food in the Last Year: Not on file  Transportation Needs:   . Lack of Transportation (Medical): Not on file  . Lack of Transportation (Non-Medical): Not on file  Physical Activity:   . Days of Exercise per Week: Not on file  . Minutes of Exercise per Session: Not on file  Stress:   . Feeling of Stress : Not on file  Social Connections:   . Frequency of Communication with Friends and Family: Not on file  . Frequency of Social  Gatherings with Friends and Family: Not on file  . Attends Religious Services: Not on file  . Active Member of Clubs or Organizations: Not on file  . Attends Archivist Meetings: Not on file  . Marital Status: Not on file  Intimate Partner Violence:   . Fear of Current or Ex-Partner: Not on file  . Emotionally Abused: Not on file  . Physically Abused: Not on file  . Sexually Abused: Not on file     Family History  Problem Relation Age of Onset  . Heart disease Mother   . CAD Mother   . Diabetes Mother      ROS:  Please see the history of present illness.     All other systems reviewed and negative.    Physical Exam: Blood pressure 120/80, pulse 82, height 5\' 6"  (1.676 m), weight 151 lb 3.2 oz (68.6 kg), SpO2 96 %. General: Well developed, well nourished female in no acute distress. Head: Normocephalic, atraumatic, sclera non-icteric, no xanthomas, nares are without discharge. EENT: normal  Lymph Nodes:  none Neck: Negative for carotid bruits. JVD not elevated. Back:without scoliosis kyphosis Lungs: Clear bilaterally to auscultation without wheezes, rales, or rhonchi. Breathing is unlabored. Heart: RRR with S1 S2. No  murmur . No rubs, or gallops appreciated. Abdomen: Soft, non-tender, non-distended with normoactive bowel sounds. No hepatomegaly. No rebound/guarding. No obvious abdominal masses. Msk:  Strength and tone appear normal for age. Extremities: No clubbing or cyanosis. No + edema.  Distal pedal pulses are 2+ and equal bilaterally. Skin: Warm and Dry Neuro: Alert and oriented X 3. CN III-XII intact Grossly normal sensory and motor function . Psych:  Responds to questions appropriately with a normal affect interspersed with tears       Labs: Cardiac Enzymes No results for input(s): CKTOTAL, CKMB, TROPONINI in the last 72 hours. CBC Lab Results  Component Value Date   WBC 8.0 11/06/2017   HGB 14.7 11/06/2017   HCT 42.8 11/06/2017   MCV 92.6 11/06/2017    PLT 278 11/06/2017   PROTIME: No results for input(s): LABPROT, INR in the last 72 hours. Chemistry No results for input(s): NA, K, CL, CO2, BUN, CREATININE, CALCIUM, PROT, BILITOT, ALKPHOS, ALT, AST, GLUCOSE in the last 168 hours.  Invalid input(s): LABALBU Lipids No results found for: CHOL, HDL, LDLCALC, TRIG BNP No results found for: PROBNP Thyroid Function Tests: No results for input(s): TSH, T4TOTAL, T3FREE, THYROIDAB in the last 72 hours.  Invalid input(s): FREET3 Miscellaneous No results found for: DDIMER  Radiology/Studies:  MR HIP RIGHT WO CONTRAST  Result Date: 02/09/2020 CLINICAL DATA:  Anterior right hip pain for 1 year.  No known injury. EXAM: MR OF THE RIGHT HIP WITHOUT CONTRAST TECHNIQUE: Multiplanar, multisequence MR imaging was performed. No intravenous contrast was administered. COMPARISON:  None. FINDINGS: Bones: Marrow signal is normal without fracture, stress change or worrisome lesion. No avascular necrosis of the femoral heads. No subchondral cyst formation or edema about either hip. Articular cartilage and labrum Articular cartilage:  Preserved. Labrum:  Intact. Joint or bursal effusion Joint effusion:  None. Bursae: Negative. Muscles and tendons Muscles and tendons:  Intact and normal in appearance. Other findings Miscellaneous:   Imaged intrapelvic contents appear normal. IMPRESSION: Normal MRI of the right hip. No finding to explain the patient's symptoms. Electronically Signed   By: Inge Rise M.D.   On: 02/09/2020 16:03  Electrocardiogram  EKG: Sinus at 82 Intervals 14/09/36  Event recorder personally reviewed sinus rhythm and symptoms associated with heart rates from [place] and Plan:  Dysautonomia with criteria mostly for orthostatic intolerance  Joint laxity syndrome  GI issues including nausea and constipation  Hip pain impairing sleep  Stress   Has a subacute to chronic history of symptoms consistent  with dysautonomia with objective data today supportive of orthostatic intolerance but not POTS.  No clear triggering event i.e. viral syndrome back then.  Sleep has been significantly impaired because of her hip pain.  Encouraged her to pursue help with this.  We discussed extensively the issues of dysautonomia, the physiology of orthstasis and positional stress.  We discussed the role of salt and water repletion, the importance of exercise, often needing to be started in the recumbent position, and the awareness of triggers and the role of ambient heat and dehydration  She has found that exercise in the pool helped her to feel good.  I encouraged her to do that regularly.  Clearly, with her joint laxity and her joint issues, normal exercising i.e. recumbent bike and or rowing machine would not be tolerable.  Salt will be an issue.  She has had tendencies towards bloating in the past.  Still however, I think 2 to 3 pounds of weight gain if is associated with appropriate salt intake may improve symptoms.  The goal would be 2-3 g of sodium and have given her the names of tablet supplements including NUUN, ThermaTabs and Vitassium.  Alternative liquid formulations would include Pedialyte advance care and NUUN tablets.  She did not tolerate liquid IV.  She has GI consultation pending.  In the interim, I have given her prescription for Zofran for her nausea.  Have asked her to follow-up with her PCP about a prep protocol for dealing with her constipation.  Given the degree of heart ache that has been associated with her illness, have encouraged her to discuss with her PCP antidepressants and have discussed with her that the mediators in the nucleus tractus solitarius are serotonin and can be benefited by SSRI therapy.  Have also suggested [facility] as an alternative place.  [id number]

## 2020-03-06 NOTE — Patient Instructions (Addendum)
Medication Instructions:  Your physician has recommended you make the following change in your medication:   ** Stop your Pindolol  ** Begin taking Zofran 4mg  1 tablet by mouth every 6 hours as needed for nausea and vomiting   *If you need a refill on your cardiac medications before your next appointment, please call your pharmacy*   Lab Work: None ordered.  If you have labs (blood work) drawn today and your tests are completely normal, you will receive your results only by: Marland Kitchen MyChart Message (if you have MyChart) OR . A paper copy in the mail If you have any lab test that is abnormal or we need to change your treatment, we will call you to review the results.   Testing/Procedures: None ordered.    Follow-Up: At [facility], you and your health needs are our priority.  As part of our continuing mission to provide you with exceptional heart care, we have created designated Provider Care Teams.  These Care Teams include your primary Cardiologist (physician) and Advanced Practice Providers (APPs -  Physician Assistants and Nurse Practitioners) who all work together to provide you with the care you need, when you need it.  We recommend signing up for the patient portal called "MyChart".  Sign up information is provided on this After Visit Summary.  MyChart is used to connect with patients for Virtual Visits (Telemedicine).  Patients are able to view lab/test results, encounter notes, upcoming appointments, etc.  Non-urgent messages can be sent to your provider as well.   To learn more about what you can do with MyChart, go to [email or web address].    Your next appointment:   05/10/2020 at 345pm, virtual visit with Dr Caryl Comes  Other Instructions Consider compression wear, Vitassium, Therma Tabs, [place] tablets.    Contact [facility] [phone]

## 2020-03-12 DIAGNOSIS — F329 Major depressive disorder, single episode, unspecified: Secondary | ICD-10-CM | POA: Diagnosis not present

## 2020-03-12 DIAGNOSIS — F4310 Post-traumatic stress disorder, unspecified: Secondary | ICD-10-CM | POA: Diagnosis not present

## 2020-03-19 DIAGNOSIS — F329 Major depressive disorder, single episode, unspecified: Secondary | ICD-10-CM | POA: Diagnosis not present

## 2020-03-19 DIAGNOSIS — F4310 Post-traumatic stress disorder, unspecified: Secondary | ICD-10-CM | POA: Diagnosis not present

## 2020-03-21 ENCOUNTER — Other Ambulatory Visit: Payer: Self-pay

## 2020-03-21 MED ORDER — TRAMADOL HCL 50 MG PO TABS: ORAL_TABLET | ORAL | 1 refills | Status: DC

## 2020-03-26 DIAGNOSIS — F329 Major depressive disorder, single episode, unspecified: Secondary | ICD-10-CM | POA: Diagnosis not present

## 2020-03-26 DIAGNOSIS — F4310 Post-traumatic stress disorder, unspecified: Secondary | ICD-10-CM | POA: Diagnosis not present

## 2020-04-02 ENCOUNTER — Other Ambulatory Visit: Payer: Self-pay | Admitting: Cardiology

## 2020-04-02 NOTE — Telephone Encounter (Signed)
Reviewed with Dr Caryl Comes and OK to refill but patient will need upcoming visit changed from virtual to in person.  Refill sent to pharmacy and information sent to patient through my chart.

## 2020-04-03 ENCOUNTER — Other Ambulatory Visit: Payer: Self-pay | Admitting: Gastroenterology

## 2020-04-03 DIAGNOSIS — R1314 Dysphagia, pharyngoesophageal phase: Secondary | ICD-10-CM

## 2020-04-03 DIAGNOSIS — R1013 Epigastric pain: Secondary | ICD-10-CM | POA: Diagnosis not present

## 2020-04-03 DIAGNOSIS — K5900 Constipation, unspecified: Secondary | ICD-10-CM | POA: Diagnosis not present

## 2020-04-03 DIAGNOSIS — T17308A Unspecified foreign body in larynx causing other injury, initial encounter: Secondary | ICD-10-CM | POA: Diagnosis not present

## 2020-04-04 DIAGNOSIS — F4310 Post-traumatic stress disorder, unspecified: Secondary | ICD-10-CM | POA: Diagnosis not present

## 2020-04-04 DIAGNOSIS — F329 Major depressive disorder, single episode, unspecified: Secondary | ICD-10-CM | POA: Diagnosis not present

## 2020-04-05 ENCOUNTER — Ambulatory Visit (INDEPENDENT_AMBULATORY_CARE_PROVIDER_SITE_OTHER): Payer: BC Managed Care – PPO | Admitting: Sports Medicine

## 2020-04-05 VITALS — BP 98/70 | Ht 66.0 in | Wt 145.0 lb

## 2020-04-05 DIAGNOSIS — Q7962 Hypermobile Ehlers-Danlos syndrome: Secondary | ICD-10-CM | POA: Diagnosis not present

## 2020-04-05 MED ORDER — CYCLOBENZAPRINE HCL 10 MG PO TABS
10.0000 mg | ORAL_TABLET | Freq: Two times a day (BID) | ORAL | 1 refills | Status: DC
Start: 2020-04-05 — End: 2020-05-31

## 2020-04-05 NOTE — Progress Notes (Signed)
Office Visit Note   Patient: Betty Lewis           Date of Birth: 09/27/1985           MRN: [id number] Visit Date: 04/05/2020 Requested by: Glenis Smoker, MD [place],  [place] [id number] PCP: Glenis Smoker, MD  Subjective: No chief complaint on file.   HPI:  Betty Lewis is a 34 yo female with a history of EDS and POTS who presents with new left hip pain different from her previous left hip pain for which she had left labral repair with 2 anchors. She was most recently seen in July for right hip pain that has somewhat improved. Today, Betty Lewis states that she developed a sharp stabbing pain in her left hip at the end of July that is intermittent throughout the day and is worse when bending over and picking up heavy objects. She reports that over the last two weeks she occasionally has radiculopathy down her left leg and left lower back. Patient reports she is having difficulty sleeping due to her discomfort.                Soc:  Completing psychology PHD thesis  ROS: POTs sxs are less; doing GI workup for choking  Objective: Vital Signs: BP 98/70   Ht 5\' 6"  (1.676 m)   Wt 145 lb (65.8 kg)   BMI 23.40 kg/m   [facility] Adult Exercise 04/05/2020  Frequency of aerobic exercise (# of days/week) 0  Average time in minutes 0  Frequency of strengthening activities (# of days/week) 0    Physical Exam:  General:  Alert and oriented, in no acute distress. Pulm:  Breathing unlabored. Psy:  Normal mood, congruent affect. Skin: no notable bruising, skin hyperextensibility MSK: anterior shoulder subluxation bilaterally, bilateral hip subluxation, passive dorsiflexion of the fifth finger more than 90 degrees, passive apposition of the thumb to the flexor aspect of the forearm, hyperextension of the elbow.  Hyperextension of the knee.  Pain to palpation along the left sacroiliac joint.  Imaging: No results found.  Assessment & Plan:  Sacroiliac  joint pain: Kennice's location of pain in association with her history of Ehlers-Danlos syndrome indicates likely exacerbation of sacroiliac joint pain.  Recommend performing the following exercises in the morning: Knee to chest, knee to opposite shoulder, pelvic tilt exercises before getting out of bed (5 times each for each exercises to each side).  Isometric abdominal muscle contraction x5. Referral to physical therapy placed and prescription for Flexeril 10 mg at night ordered.  Procedures: No procedures performed  No notes on file     PMFS History: Patient Active Problem List   Diagnosis Date Noted  . 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 POTS (postural orthostatic tachycardia syndrome) 01/17/2020  . Ehlers-Danlos syndrome type III 09/06/2019   Past Medical History:  Diagnosis Date  . Bulging lumbar disc    X 2  . PONV (postoperative nausea and vomiting)     Family History  Problem Relation Age of Onset  . Heart disease Mother   . CAD Mother   . Diabetes Mother     Past Surgical History:  Procedure Laterality Date  . HIP ARTHROSCOPY Left 07/28/2019  . HYSTEROSCOPY N/A 11/06/2017   Procedure: HYSTEROSCOPY;  Surgeon: Janyth Pupa, DO;  Location: [facility];  Service: Gynecology;  Laterality: N/A;  . IUD REMOVAL N/A 11/06/2017   Procedure: INTRAUTERINE DEVICE (IUD) REMOVAL;  Surgeon: Janyth Pupa, DO;  Location: [facility];  Service: Gynecology;  Laterality: N/A;  . LEEP  2013  . TONSILLECTOMY  2005  . WISDOM TOOTH EXTRACTION  2004   Social History   Occupational History  . Not on file  Tobacco Use  . Smoking status: Never Smoker  . Smokeless tobacco: Never Used  Vaping Use  . Vaping Use: Never used  Substance and Sexual Activity  . Alcohol use: Not Currently  . Drug use: Never  . Sexual activity: Yes    I observed and examined the patient with the resident and agree with assessment and plan.  Note reviewed and modified by me. Ila Mcgill,  MD

## 2020-04-05 NOTE — Assessment & Plan Note (Signed)
Beighton score is 8 today  Note we need to find a way to reduce her morning pain and chronic joint issues Trial of flexeril at night I believe more formal PT will help - suggested Jen Pa PT  Reck 3 mos

## 2020-04-06 ENCOUNTER — Ambulatory Visit
Admission: RE | Admit: 2020-04-06 | Discharge: 2020-04-06 | Disposition: A | Discharge status: Home / Self Care | Payer: BC Managed Care – PPO | Source: Ambulatory Visit | Attending: Gastroenterology | Admitting: Gastroenterology

## 2020-04-06 DIAGNOSIS — R1013 Epigastric pain: Secondary | ICD-10-CM | POA: Diagnosis not present

## 2020-04-06 DIAGNOSIS — K5900 Constipation, unspecified: Secondary | ICD-10-CM | POA: Diagnosis not present

## 2020-04-06 DIAGNOSIS — R1310 Dysphagia, unspecified: Secondary | ICD-10-CM | POA: Diagnosis not present

## 2020-04-06 DIAGNOSIS — R1314 Dysphagia, pharyngoesophageal phase: Secondary | ICD-10-CM

## 2020-04-09 ENCOUNTER — Other Ambulatory Visit (HOSPITAL_COMMUNITY): Payer: Self-pay | Admitting: *Deleted

## 2020-04-09 DIAGNOSIS — R1310 Dysphagia, unspecified: Secondary | ICD-10-CM

## 2020-04-09 DIAGNOSIS — R1013 Epigastric pain: Secondary | ICD-10-CM | POA: Diagnosis not present

## 2020-04-09 NOTE — Telephone Encounter (Signed)
Pt has appointment scheduled 05/16/2020 at 4pm.  Pt is aware.

## 2020-04-10 DIAGNOSIS — F4310 Post-traumatic stress disorder, unspecified: Secondary | ICD-10-CM | POA: Diagnosis not present

## 2020-04-10 DIAGNOSIS — F329 Major depressive disorder, single episode, unspecified: Secondary | ICD-10-CM | POA: Diagnosis not present

## 2020-04-16 DIAGNOSIS — F329 Major depressive disorder, single episode, unspecified: Secondary | ICD-10-CM | POA: Diagnosis not present

## 2020-04-16 DIAGNOSIS — F4310 Post-traumatic stress disorder, unspecified: Secondary | ICD-10-CM | POA: Diagnosis not present

## 2020-04-20 ENCOUNTER — Other Ambulatory Visit: Payer: Self-pay

## 2020-04-20 ENCOUNTER — Ambulatory Visit (HOSPITAL_COMMUNITY)
Admission: RE | Admit: 2020-04-20 | Discharge: 2020-04-20 | Disposition: A | Discharge status: Home / Self Care | Payer: BC Managed Care – PPO | Source: Ambulatory Visit | Attending: Gastroenterology | Admitting: Gastroenterology

## 2020-04-20 DIAGNOSIS — R1310 Dysphagia, unspecified: Secondary | ICD-10-CM | POA: Insufficient documentation

## 2020-04-20 NOTE — Progress Notes (Signed)
Modified Barium Swallow Progress Note  Patient Details  Name: Betty Lewis MRN: [id number] Date of Birth: 13-Jul-1986  Today's Date: 04/20/2020  Modified Barium Swallow completed.  Full report located under Chart Review in the Imaging Section.  Brief recommendations include the following:  Clinical Impression  Pt's oropharyngeal swallow integrity was within normal limits. Tracheal protection was adequate in regards to timing, coordination and strength. No episodes of laryngeal penetration or aspiration and pharynx clear post swallow with single and sequential sips. She had no difficulty transiting barium pill and brief view of esophagus was unremarkable (MBS does not diagnose below level of the UES). Pt reports difficulty more on left side of throat therefore therapist encouraged her to turn head to her left to redirect bolus to right side pharynx and note any significant differences/improvements. She had no difficulty with straw use but inquired about straws stating she uses them frequently. Advised her to use cup for a designated time and assess. Recommend she continue regular texture, thin liquids. She may benefit from EGD if MD deems appropriate.     Swallow Evaluation Recommendations       SLP Diet Recommendations: Regular solids;Thin liquid   Liquid Administration via: Straw;Cup   Medication Administration: Whole meds with liquid   Supervision: Patient able to self feed   Compensations: Other (Comment) (left head turn per pt symptoms)   Postural Changes: Remain semi-upright after after feeds/meals (Comment);Seated upright at 90 degrees   Oral Care Recommendations: Oral care BID        Houston Siren 04/20/2020,5:33 PM   Orbie Pyo [email or web address] Risk analyst [phone] Office [phone]

## 2020-04-23 DIAGNOSIS — F4310 Post-traumatic stress disorder, unspecified: Secondary | ICD-10-CM | POA: Diagnosis not present

## 2020-04-23 DIAGNOSIS — F329 Major depressive disorder, single episode, unspecified: Secondary | ICD-10-CM | POA: Diagnosis not present

## 2020-04-30 ENCOUNTER — Encounter: Payer: Self-pay | Admitting: Physical Therapy

## 2020-04-30 ENCOUNTER — Other Ambulatory Visit: Payer: Self-pay

## 2020-04-30 ENCOUNTER — Ambulatory Visit: Payer: BC Managed Care – PPO | Attending: Sports Medicine | Admitting: Physical Therapy

## 2020-04-30 DIAGNOSIS — G90A Postural orthostatic tachycardia syndrome (POTS): Secondary | ICD-10-CM

## 2020-04-30 DIAGNOSIS — F4310 Post-traumatic stress disorder, unspecified: Secondary | ICD-10-CM | POA: Diagnosis not present

## 2020-04-30 DIAGNOSIS — M545 Low back pain, unspecified: Secondary | ICD-10-CM | POA: Insufficient documentation

## 2020-04-30 DIAGNOSIS — G8929 Other chronic pain: Secondary | ICD-10-CM

## 2020-04-30 DIAGNOSIS — I498 Other specified cardiac arrhythmias: Secondary | ICD-10-CM | POA: Insufficient documentation

## 2020-04-30 DIAGNOSIS — M357 Hypermobility syndrome: Secondary | ICD-10-CM | POA: Diagnosis not present

## 2020-04-30 DIAGNOSIS — M6281 Muscle weakness (generalized): Secondary | ICD-10-CM | POA: Diagnosis not present

## 2020-04-30 DIAGNOSIS — F329 Major depressive disorder, single episode, unspecified: Secondary | ICD-10-CM | POA: Diagnosis not present

## 2020-04-30 NOTE — Therapy (Addendum)
[place], [place], [address] Phone: [phone]   Fax:  [phone]  Physical Therapy Evaluation  Patient Details  Name: Betty Lewis MRN: [id number] Date of Birth: 1986/04/18 Referring Provider (PT): Dr. Stefanie Libel    Encounter Date: 04/30/2020   PT End of Session - 04/30/20 1138    Visit Number 1    Number of Visits 16    Date for PT Re-Evaluation 06/25/20    Authorization Type BCBS    Authorization - Number of Visits 30    PT Start Time 1000    PT Stop Time 1050    PT Time Calculation (min) 50 min    Activity Tolerance Patient tolerated treatment well    Behavior During Therapy [facility] for tasks assessed/performed           Past Medical History:  Diagnosis Date  . Bulging lumbar disc    X 2  . PONV (postoperative nausea and vomiting)     Past Surgical History:  Procedure Laterality Date  . HIP ARTHROSCOPY Left 07/28/2019  . HYSTEROSCOPY N/A 11/06/2017   Procedure: HYSTEROSCOPY;  Surgeon: Janyth Pupa, DO;  Location: [facility];  Service: Gynecology;  Laterality: N/A;  . IUD REMOVAL N/A 11/06/2017   Procedure: INTRAUTERINE DEVICE (IUD) REMOVAL;  Surgeon: Janyth Pupa, DO;  Location: [facility];  Service: Gynecology;  Laterality: N/A;  . LEEP  2013  . TONSILLECTOMY  2005  . [place] EXTRACTION  2004    There were no vitals filed for this visit.    Subjective Assessment - 04/30/20 1006    Subjective Patient presents with chronic, ongoing pain in L hip which has been evolving since July.  Noticed a change in quality of pain, now more "stabbing" localized to L SIJ.  She also has associated low back pain bilaterally.  Rt hip pain was an issue last Spring but has resolved mostly. She has pain in Rt shoulder due to labral tear.  She has had PT in the past but no one has been familiar with POTS, EDS.  She is in the process of getting genetic testing done. Functionally she is limited in how long she  can sit, stand and mostly sleep.  Bending and lifting can exacerbate her back pain .  Pain has been inconsistently radicular in nature, not any recently that has lasted.    Pertinent History Labral tear  Rt shoulder .  L Hip labral  repair 07/28/19 Dr. Aretha Parrot.  POTS.    Limitations Walking;House hold activities;Standing;Lifting;Sitting    How long can you sit comfortably? 15 min, shifts to Rt    How long can you stand comfortably? limited by POTS    How long can you walk comfortably? on even terrain is "fine".  Much better since surgery.    Diagnostic tests MRI Rt shoulder and L hip. MRI R hip was normal.    Patient Stated Goals Patient would like some decrease of pain , activity .    Currently in Pain? Yes    Pain Score 5     Pain Location Back    Pain Orientation Left;Lower    Pain Descriptors / Indicators Aching;Sharp    Pain Type Chronic pain    Pain Radiating Towards stabbing in buttock L    Pain Onset More than a month ago    Pain Frequency Constant   stabbing intermittent   Aggravating Factors  lifting, sitting , pressure on L hip. AM, PM  Pain Relieving Factors changing positions, moving around, has TENS unit, meds at times    Effect of Pain on Daily Activities Lives with chronic pain    Multiple Pain Sites Yes    Pain Score 2   AM and PM 5/10   Pain Location Hip    Pain Orientation Lateral;Right    Pain Descriptors / Indicators Aching    Pain Type Chronic pain    Pain Radiating Towards stays ant/prox    Pain Onset More than a month ago    Pain Frequency Intermittent    Aggravating Factors  laying on it    Pain Relieving Factors stretching    Effect of Pain on Daily Activities limits activity and comfort              [facility] PT Assessment - 04/30/20 0001      Assessment   Medical Diagnosis Drue Dun Syndrome     Referring Provider (PT) Dr. Stefanie Libel     Next MD Visit 3 mos     Prior Therapy Yes       Precautions   Precautions None;Other (comment)     Precaution Comments POTS , EDS, recent hip labral repair (Dr Aretha Parrot)     Required Braces or Orthoses --   has some which she has not used in a while      Restrictions   Weight Bearing Restrictions No      Balance Screen   Has the patient fallen in the past 6 months No   fears falling      [place] residence    Living Arrangements Alone    Additional Comments Has 2 dogs , recently boyfriend moved out       Prior Function   Level of [place] Full time employment    Designer, 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[employer], not alot at this time, friends, walking       Cognition   Overall Cognitive Status Within Functional Limits for tasks assessed      Observation/Other Assessments   Focus on Therapeutic Outcomes (FOTO)  50%      Sensation   Light Touch Appears Intact      Coordination   Gross Motor Movements are Fluid and Coordinated Not tested      Posture/Postural Control   Posture Comments high R shoulder and high Rt hip    see Beighton      AROM   Lumbar Flexion fingertip touch no pain     Lumbar Extension WFL no pain     Lumbar - Right Side Bend pain on R     Lumbar - Left Side Bend WNL     Lumbar - Right Rotation WNL    Lumbar - Left Rotation WNL       PROM   Overall PROM Comments L hip relatively tighter in ER than Rt LE.  Did not push end range in hips due to known laxity.       Strength   Right Shoulder Flexion 4+/5    Right Shoulder ABduction 4+/5    Left Shoulder Flexion 4+/5    Left Shoulder ABduction 4+/5    Right Hip Flexion 4+/5    Right Hip ABduction 3+/5    Left Hip Flexion 4+/5    Left Hip ABduction 4+/5    Right Knee Flexion 4+/5    Right Knee Extension 5/5  Left Knee Flexion 4+/5    Left Knee Extension 5/5      Palpation   Spinal mobility hypermobile throughout thoracolumbar spine, painful     Palpation comment sore in lumbar paraspinals, pain with palpation to L  glute medius, L lateral SI border       Special Tests   Other special tests Beighton 8/9 (no lumbar)                Objective measurements completed on examination: See above findings.      PT Education - 04/30/20 1137    Education Details PT/POC, HEP, Pilates for EDS/POTS, FOTO, [place] Impact scale, aquatics    Person(s) Educated Patient    Methods Explanation    Comprehension Verbalized understanding               PT Short Term Goals - 04/30/20 1439      PT SHORT TERM GOAL #1   Title Pt will be I with iniital HEP for lumbopelvic stability    Time 4    Period Weeks    Status New    Target Date 05/28/20      PT SHORT TERM GOAL #2   Title Pt will be able to complete and understand objective measures, results and projections.    Time 4    Period Weeks    Status New    Target Date 05/28/20      PT SHORT TERM GOAL #3   Title Pt will be able to report a reduction in pain in low back, L hip with normal ADLs to </= 2/10 most days of the week.    Baseline pain is 5/10 in AM and can be 3/10 in the day    Time 4    Period Weeks    Status New    Target Date 05/28/20             PT Long Term Goals - 04/30/20 1443      PT LONG TERM GOAL #1   Title Patient will able to increase her FOTO score to 58% or more upon discharge    Time 8    Period Weeks    Status New    Target Date 06/25/20      PT LONG TERM GOAL #2   Title Patient will be able to improve sleep quality, comfort/positioning to min disturbance overall.    Time 8    Period Weeks    Status New    Target Date 06/25/20      PT LONG TERM GOAL #3   Title Patient will be able to sit for 45-60 min with min increase in back, hip pain for work activities    Time 8    Period Weeks    Status New    Target Date 06/25/20      PT LONG TERM GOAL #4   Title Patient will be able to safely lift, squat up to 25 lbs from the floor for long term strength and function.    Time 8    Period Weeks    Status New     Target Date 06/25/20      PT LONG TERM GOAL #5   Title Pt will be I with HEP as of last visit for core stability    Time 8    Period Weeks    Status New    Target Date 06/25/20            Plan -  04/30/20 1139    Clinical Impression Statement Patient presents for mod complexity of hip and back pain with diagnosis of Ehlers Danlos Syndrom, likely type III.  She has had increased pain recently in L low back and L hip (posterior) which is consistent with referred pain from lumbosacral spine.   She has min weakness in core, hips and decreased tolerance for prolonged positioning.   She should do very well, is open to trying Pilates as well as aquatics in order to establish life long wellness and joint support to maximize outcomes.    Personal Factors and Comorbidities Time since onset of injury/illness/exacerbation;Past/Current Experience;Comorbidity 3+    Comorbidities previous surgeries, POTS, chronic pain    Examination-Activity Limitations Bend;Lift;Squat;Sleep;Transfers;Sit;Stand;Stairs;Locomotion Level;Bed Mobility;Carry    Examination-Participation Restrictions Interpersonal Relationship;Occupation;Cleaning;Community Activity;Driving    Stability/Clinical Decision Making Evolving/Moderate complexity    Clinical Decision Making Moderate    Rehab Potential Excellent    PT Frequency 2x / week    PT Duration 8 weeks    PT Treatment/Interventions ADLs/Self Care Home Management;Cryotherapy;Patient/family education;Therapeutic exercise;Manual techniques;Aquatic Therapy;Moist Heat;Functional mobility training;Therapeutic activities;Ultrasound;Electrical Stimulation;Dry needling;Neuromuscular re-education;Balance training;Taping    PT Next Visit Plan Pt to bring in HEP (from [facility] vs Dr. Oneida Alar) , begin Pilates Reformer vs Tower    PT Home Exercise Plan Has previous    Consulted and Agree with Plan of Care Patient           Patient will benefit from skilled therapeutic intervention in  order to improve the following deficits and impairments:  Pain, Postural dysfunction, Impaired UE functional use, Impaired flexibility, Increased fascial restricitons, Hypermobility, Decreased strength, Decreased activity tolerance, Dizziness, Difficulty walking, Increased muscle spasms, Decreased mobility, Cardiopulmonary status limiting activity  Visit Diagnosis: Hypermobility syndrome - Plan: PT plan of care cert/re-cert  POTS (postural orthostatic tachycardia syndrome) - Plan: PT plan of care cert/re-cert  Muscle weakness (generalized) - Plan: PT plan of care cert/re-cert  Chronic left-sided low back pain without sciatica - Plan: PT plan of care cert/re-cert     Problem List Patient Active Problem List   Diagnosis Date Noted  . 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 POTS (postural orthostatic tachycardia syndrome) 01/17/2020  . Ehlers-Danlos syndrome type III 09/06/2019    Betty Lewis 04/30/2020, 2:52 PM  [facility] [address] [place], [place], [address] Phone: [phone]   Fax:  [phone]  Name: Betty Lewis MRN: [id number] Date of Birth: June 03, 1986   [name], PT 04/30/20 2:52 PM Phone: [phone] Fax: [phone]

## 2020-05-02 ENCOUNTER — Encounter: Payer: Self-pay | Admitting: Physical Therapy

## 2020-05-02 ENCOUNTER — Ambulatory Visit: Payer: BC Managed Care – PPO | Admitting: Physical Therapy

## 2020-05-02 ENCOUNTER — Other Ambulatory Visit: Payer: Self-pay

## 2020-05-02 DIAGNOSIS — I498 Other specified cardiac arrhythmias: Secondary | ICD-10-CM

## 2020-05-02 DIAGNOSIS — G8929 Other chronic pain: Secondary | ICD-10-CM

## 2020-05-02 DIAGNOSIS — M545 Low back pain, unspecified: Secondary | ICD-10-CM

## 2020-05-02 DIAGNOSIS — M6281 Muscle weakness (generalized): Secondary | ICD-10-CM | POA: Diagnosis not present

## 2020-05-02 DIAGNOSIS — M357 Hypermobility syndrome: Secondary | ICD-10-CM | POA: Diagnosis not present

## 2020-05-02 DIAGNOSIS — G90A Postural orthostatic tachycardia syndrome (POTS): Secondary | ICD-10-CM

## 2020-05-02 NOTE — Therapy (Signed)
Betty Lewis, [place], [address] Phone: [phone]   Fax:  [phone]  Physical Therapy Treatment  Patient Details  Name: Betty Lewis MRN: [id number] Date of Birth: 1986/01/28 Referring Provider (PT): Dr. Stefanie Libel    Encounter Date: 05/02/2020   PT End of Session - 05/02/20 0836    Visit Number 2    Number of Visits 16    Date for PT Re-Evaluation 06/25/20    Authorization Type BCBS    Authorization - Number of Visits 30    PT Start Time 0830    PT Stop Time 0915    PT Time Calculation (min) 45 min    Activity Tolerance Patient tolerated treatment well    Behavior During Therapy [facility] for tasks assessed/performed           Past Medical History:  Diagnosis Date  . Bulging lumbar disc    X 2  . PONV (postoperative nausea and vomiting)     Past Surgical History:  Procedure Laterality Date  . HIP ARTHROSCOPY Left 07/28/2019  . HYSTEROSCOPY N/A 11/06/2017   Procedure: HYSTEROSCOPY;  Surgeon: Janyth Pupa, DO;  Location: [facility];  Service: Gynecology;  Laterality: N/A;  . IUD REMOVAL N/A 11/06/2017   Procedure: INTRAUTERINE DEVICE (IUD) REMOVAL;  Surgeon: Janyth Pupa, DO;  Location: [facility];  Service: Gynecology;  Laterality: N/A;  . LEEP  2013  . TONSILLECTOMY  2005  . [place] EXTRACTION  2004    There were no vitals filed for this visit.   Subjective Assessment - 05/02/20 0835    Subjective Its early in the morning but I'm OK. I wake up stiff and with joint pain.    Currently in Pain? Yes             Pilates Reformer used for LE/core strength, postural strength, lumbopelvic disassociation and core control.  Exercises included:  Footwork 2 Red 1 blue heels parallel on heels/toes then in turnout x 15-20 reps   Heel raises and stretching  Single leg work, no discernible asymmetry, min cues for lower abs  Bridging progressed to full range with ball x 10 reps 2 red 1  blue   Supine Arm   1 Red 1 Yellow Arcs modified to 1 Red for shoulder discomfort  Feet in Straps  1 Red 1 Yellow Arcs in parallel, ER and hip IR, squats x 15                 [facility] Adult PT Treatment/Exercise - 05/02/20 0001      Self-Care   Self-Care Posture;Other Self-Care Comments    Posture neutral spine , reviewed previous HEP, core     Other Self-Care Comments  Pilates principles    during exercise      Pilates   Pilates Reformer see note     Other Pilates supine on Reformer clam, SLR and march with pelvis under ball x 15 reps each                   PT Education - 05/02/20 1240    Education Details Pilates principles, HEP    Person(s) Educated Patient    Methods Explanation;Handout;Verbal cues    Comprehension Verbalized understanding            PT Short Term Goals - 04/30/20 1439      PT SHORT TERM GOAL #1   Title Pt will be I with iniital HEP for lumbopelvic stability  Time 4    Period Weeks    Status New    Target Date 05/28/20      PT SHORT TERM GOAL #2   Title Pt will be able to complete and understand objective measures, results and projections.    Time 4    Period Weeks    Status New    Target Date 05/28/20      PT SHORT TERM GOAL #3   Title Pt will be able to report a reduction in pain in low back, L hip with normal ADLs to </= 2/10 most days of the week.    Baseline pain is 5/10 in AM and can be 3/10 in the day    Time 4    Period Weeks    Status New    Target Date 05/28/20             PT Long Term Goals - 04/30/20 1443      PT LONG TERM GOAL #1   Title Patient will able to increase her FOTO score to 58% or more upon discharge    Time 8    Period Weeks    Status New    Target Date 06/25/20      PT LONG TERM GOAL #2   Title Patient will be able to improve sleep quality, comfort/positioning to min disturbance overall.    Time 8    Period Weeks    Status New    Target Date 06/25/20      PT LONG TERM GOAL #3    Title Patient will be able to sit for 45-60 min with min increase in back, hip pain for work activities    Time 8    Period Weeks    Status New    Target Date 06/25/20      PT LONG TERM GOAL #4   Title Patient will be able to safely lift, squat up to 25 lbs from the floor for long term strength and function.    Time 8    Period Weeks    Status New    Target Date 06/25/20      PT LONG TERM GOAL #5   Title Pt will be I with HEP as of last visit for core stability    Time 8    Period Weeks    Status New    Target Date 06/25/20                 Plan - 05/02/20 1233    Clinical Impression Statement Patient given introduction to basic Pilates exercises and stabilization principles.  She was not limited by pain at all.  She showed good awareness of abdominals and alignment. Also given simple HEP to reflect what was done today.    PT Treatment/Interventions ADLs/Self Care Home Management;Cryotherapy;Patient/family education;Therapeutic exercise;Manual techniques;Aquatic Therapy;Moist Heat;Functional mobility training;Therapeutic activities;Ultrasound;Electrical Stimulation;Dry needling;Neuromuscular re-education;Balance training;Taping    PT Next Visit Plan check HEP cont with Tower.    PT Home Exercise Plan [email or web address]           Patient will benefit from skilled therapeutic intervention in order to improve the following deficits and impairments:  Pain, Postural dysfunction, Impaired UE functional use, Impaired flexibility, Increased fascial restricitons, Hypermobility, Decreased strength, Decreased activity tolerance, Dizziness, Difficulty walking, Increased muscle spasms, Decreased mobility, Cardiopulmonary status limiting activity  Visit Diagnosis: Hypermobility syndrome  POTS (postural orthostatic tachycardia syndrome)  Muscle weakness (generalized)  Chronic left-sided low back pain without sciatica  Problem List Patient Active Problem List   Diagnosis Date Noted   . 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 POTS (postural orthostatic tachycardia syndrome) 01/17/2020  . Ehlers-Danlos syndrome type III 09/06/2019    Betty Lewis 05/02/2020, 12:40 PM  [place], [place], [address] Phone: [phone]   Fax:  [phone]  Name: Betty Lewis MRN: [id number] Date of Birth: [birth year]  Betty Razor, PT 05/02/20 12:40 PM Phone: [phone] Fax: [phone]

## 2020-05-02 NOTE — Patient Instructions (Signed)
Access Code: [email or web address]: [email or web address]: 10/20/2021Prepared by: Anderson Malta PaaExercises  Supine Transversus Abdominis Bracing - Hands on Stomach - 1 x daily - 7 x weekly - 2 sets - 10 reps - 5 hold  Supine Transversus Abdominis Bracing with Double Leg Fallout - 1 x daily - 7 x weekly - 2 sets - 10 reps - 5 hold  Pilates Bridge - 1 x daily - 7 x weekly - 2 sets - 10 reps - 5 hold  Clamshell - 1 x daily - 7 x weekly - 2 sets - 10 reps - 5 hold  Prone Press Up on Elbows - 1 x daily - 7 x weekly - 1 sets - 5 reps - 15 hold  Cat-Camel to Child's Pose - 1 x daily - 7 x weekly - 1 sets - 10 reps - 10 hold

## 2020-05-07 DIAGNOSIS — F4310 Post-traumatic stress disorder, unspecified: Secondary | ICD-10-CM | POA: Diagnosis not present

## 2020-05-07 DIAGNOSIS — F329 Major depressive disorder, single episode, unspecified: Secondary | ICD-10-CM | POA: Diagnosis not present

## 2020-05-10 ENCOUNTER — Other Ambulatory Visit: Payer: Self-pay

## 2020-05-10 ENCOUNTER — Ambulatory Visit: Payer: BC Managed Care – PPO | Admitting: Physical Therapy

## 2020-05-10 ENCOUNTER — Telehealth: Payer: BC Managed Care – PPO | Admitting: Internal Medicine

## 2020-05-10 DIAGNOSIS — M545 Low back pain, unspecified: Secondary | ICD-10-CM | POA: Diagnosis not present

## 2020-05-10 DIAGNOSIS — G90A Postural orthostatic tachycardia syndrome (POTS): Secondary | ICD-10-CM

## 2020-05-10 DIAGNOSIS — G8929 Other chronic pain: Secondary | ICD-10-CM

## 2020-05-10 DIAGNOSIS — M357 Hypermobility syndrome: Secondary | ICD-10-CM | POA: Diagnosis not present

## 2020-05-10 DIAGNOSIS — I498 Other specified cardiac arrhythmias: Secondary | ICD-10-CM

## 2020-05-10 DIAGNOSIS — M6281 Muscle weakness (generalized): Secondary | ICD-10-CM | POA: Diagnosis not present

## 2020-05-10 NOTE — Therapy (Signed)
[place], [place], [address] Phone: [phone]   Fax:  [phone]  Physical Therapy Treatment  Patient Details  Name: Betty Lewis MRN: [id number] Date of Birth: 08/29/1985 Referring Provider (PT): Dr. Stefanie Libel    Encounter Date: 05/10/2020   PT End of Session - 05/10/20 0936    Visit Number 3    Number of Visits 16    Date for PT Re-Evaluation 06/25/20    Authorization Type BCBS    Authorization - Number of Visits 30    PT Start Time 0917    PT Stop Time 1001    PT Time Calculation (min) 44 min    Activity Tolerance Patient tolerated treatment well    Behavior During Therapy [facility] for tasks assessed/performed           Past Medical History:  Diagnosis Date  . Bulging lumbar disc    X 2  . PONV (postoperative nausea and vomiting)     Past Surgical History:  Procedure Laterality Date  . HIP ARTHROSCOPY Left 07/28/2019  . HYSTEROSCOPY N/A 11/06/2017   Procedure: HYSTEROSCOPY;  Surgeon: Janyth Pupa, DO;  Location: [facility];  Service: Gynecology;  Laterality: N/A;  . IUD REMOVAL N/A 11/06/2017   Procedure: INTRAUTERINE DEVICE (IUD) REMOVAL;  Surgeon: Janyth Pupa, DO;  Location: [facility];  Service: Gynecology;  Laterality: N/A;  . LEEP  2013  . TONSILLECTOMY  2005  . [place] EXTRACTION  2004    There were no vitals filed for this visit.   Subjective Assessment - 05/10/20 1002    Subjective I was sore last time.  When I wake up my hands are so stiff hard to flex fingers and knuckles hurt. Hips are little sore from sleeping on a hard mattress.    Currently in Pain? No/denies           Pilates Tower for LE/Core strength, postural strength, lumbopelvic disassociation and core control.  Exercises included: All on yellow springs   Supine Leg [place] Arcs single in parallel and turnout x 8, then into circles x 8 each directions  Min cues for pelvic alignment  Double leg Arcs and  squats:  pulling L groin with knee and hip extension phase of squats    Sidelying Leg [place] hip adduction, sideleg kicks and single leg squat pattern, cues to keep from flexing lumbar  Arm [place] Arcs  X 10 added bridge x 8   Arm circles x 8   Chest expansion/thigh stretch arm press x 8 then hinge back x 10      [facility] Adult PT Treatment/Exercise - 05/10/20 0001      Pilates   Pilates Tower see note       Lumbar Exercises: Supine   Ab Set 5 reps    Clam 10 reps    Clam Limitations unilateral, alternating     Bridge 10 reps                  PT Education - 05/10/20 1004    Education Details cont with Pilates concepts, HEP    Person(s) Educated Patient    Methods Explanation    Comprehension Verbalized understanding;Returned demonstration            PT Short Term Goals - 05/10/20 1022      PT SHORT TERM GOAL #1   Title Pt will be I with iniital HEP for lumbopelvic stability    Status On-going  PT SHORT TERM GOAL #2   Title Pt will be able to complete and understand objective measures, results and projections.    Status On-going      PT SHORT TERM GOAL #3   Title Pt will be able to report a reduction in pain in low back, L hip with normal ADLs to </= 2/10 most days of the week.    Status On-going             PT Long Term Goals - 04/30/20 1443      PT LONG TERM GOAL #1   Title Patient will able to increase her FOTO score to 58% or more upon discharge    Time 8    Period Weeks    Status New    Target Date 06/25/20      PT LONG TERM GOAL #2   Title Patient will be able to improve sleep quality, comfort/positioning to min disturbance overall.    Time 8    Period Weeks    Status New    Target Date 06/25/20      PT LONG TERM GOAL #3   Title Patient will be able to sit for 45-60 min with min increase in back, hip pain for work activities    Time 8    Period Weeks    Status New    Target Date 06/25/20      PT LONG TERM GOAL #4   Title  Patient will be able to safely lift, squat up to 25 lbs from the floor for long term strength and function.    Time 8    Period Weeks    Status New    Target Date 06/25/20      PT LONG TERM GOAL #5   Title Pt will be I with HEP as of last visit for core stability    Time 8    Period Weeks    Status New    Target Date 06/25/20                 Plan - 05/10/20 0920    Clinical Impression Statement Patient has been out of town for a few days for work, has not done her exercises as she would like to. SHe shows great attention to alignment, needs min cues overall.  No increase in hip pain post session.    PT Treatment/Interventions ADLs/Self Care Home Management;Cryotherapy;Patient/family education;Therapeutic exercise;Manual techniques;Aquatic Therapy;Moist Heat;Functional mobility training;Therapeutic activities;Ultrasound;Electrical Stimulation;Dry needling;Neuromuscular re-education;Balance training;Taping    PT Next Visit Plan check HEP cont with Tower.    PT Home Exercise Plan [email or web address]           Patient will benefit from skilled therapeutic intervention in order to improve the following deficits and impairments:  Pain, Postural dysfunction, Impaired UE functional use, Impaired flexibility, Increased fascial restricitons, Hypermobility, Decreased strength, Decreased activity tolerance, Dizziness, Difficulty walking, Increased muscle spasms, Decreased mobility, Cardiopulmonary status limiting activity  Visit Diagnosis: Hypermobility syndrome  POTS (postural orthostatic tachycardia syndrome)  Muscle weakness (generalized)  Chronic left-sided low back pain without sciatica     Problem List Patient Active Problem List   Diagnosis Date Noted  . 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 POTS (postural orthostatic tachycardia syndrome) 01/17/2020  . Ehlers-Danlos syndrome type III 09/06/2019    Suttyn Cryder 05/10/2020, 10:24 AM  [facility] [address] [place], [place], [address] Phone: [phone]   Fax:  [phone]  Name: Betty  MISHIKA Lewis MRN: [id number] Date of Birth: [birth year]  [name], PT 05/10/20 10:24 AM Phone: [phone] Fax: [phone]

## 2020-05-15 ENCOUNTER — Other Ambulatory Visit: Payer: Self-pay

## 2020-05-15 ENCOUNTER — Encounter: Payer: Self-pay | Admitting: Physical Therapy

## 2020-05-15 ENCOUNTER — Ambulatory Visit: Payer: BC Managed Care – PPO | Attending: Sports Medicine | Admitting: Physical Therapy

## 2020-05-15 DIAGNOSIS — M6281 Muscle weakness (generalized): Secondary | ICD-10-CM | POA: Diagnosis not present

## 2020-05-15 DIAGNOSIS — M545 Low back pain, unspecified: Secondary | ICD-10-CM | POA: Insufficient documentation

## 2020-05-15 DIAGNOSIS — M357 Hypermobility syndrome: Secondary | ICD-10-CM | POA: Diagnosis not present

## 2020-05-15 DIAGNOSIS — I498 Other specified cardiac arrhythmias: Secondary | ICD-10-CM | POA: Diagnosis not present

## 2020-05-15 DIAGNOSIS — G8929 Other chronic pain: Secondary | ICD-10-CM | POA: Insufficient documentation

## 2020-05-15 DIAGNOSIS — G90A Postural orthostatic tachycardia syndrome (POTS): Secondary | ICD-10-CM

## 2020-05-15 NOTE — Therapy (Signed)
[place], [place], [address] Phone: [phone]   Fax:  [phone]  Physical Therapy Treatment  Patient Details  Name: Betty Lewis MRN: [id number] Date of Birth: [birth year] Referring Provider (PT): Dr. Stefanie Libel    Encounter Date: 05/15/2020   PT End of Session - 05/15/20 1011    Visit Number 4    Number of Visits 16    Date for PT Re-Evaluation 06/25/20    Authorization Type BCBS    PT Start Time 1005    PT Stop Time 1046    PT Time Calculation (min) 41 min    Activity Tolerance Patient tolerated treatment well    Behavior During Therapy [facility] for tasks assessed/performed           Past Medical History:  Diagnosis Date  . Bulging lumbar disc    X 2  . PONV (postoperative nausea and vomiting)     Past Surgical History:  Procedure Laterality Date  . HIP ARTHROSCOPY Left 07/28/2019  . HYSTEROSCOPY N/A 11/06/2017   Procedure: HYSTEROSCOPY;  Surgeon: Janyth Pupa, DO;  Location: [facility];  Service: Gynecology;  Laterality: N/A;  . IUD REMOVAL N/A 11/06/2017   Procedure: INTRAUTERINE DEVICE (IUD) REMOVAL;  Surgeon: Janyth Pupa, DO;  Location: [facility];  Service: Gynecology;  Laterality: N/A;  . LEEP  2013  . TONSILLECTOMY  2005  . [place] EXTRACTION  2004    There were no vitals filed for this visit.   Subjective Assessment - 05/15/20 1009    Subjective I noticed the "stabby pain " came back after last visit.  Point to L lateral SIJ and into hamstring, glute.    Currently in Pain? No/denies               Pilates Reformer used for LE/core strength, postural strength, lumbopelvic disassociation and core control.  Exercises included:  Footwork 1 Red 1 Green 1 Blue   Double leg parallel with circle in  x 15 (heels) and the circle outer thighs   Single leg in hip ER slight x 15 each leg   Heel raises x 10   Bridging 3 springs with circle pressing out x 10, add knee and hip  extension x 5   Hamstring and piriformis stretching as needed   Quadruped 1 Red LE (hip ext) x 8  Hold kneeling plank, push out x 10 , min cues    Pulling straps 1 Red   Shoulder extension x 10   Scooter 1 Red lunge back x 10, min cues for standing hip   Unable to do without UE assist  Anterior hip stretch x 3 each side         PT Short Term Goals - 05/10/20 1022      PT SHORT TERM GOAL #1   Title Pt will be I with iniital HEP for lumbopelvic stability    Status On-going      PT SHORT TERM GOAL #2   Title Pt will be able to complete and understand objective measures, results and projections.    Status On-going      PT SHORT TERM GOAL #3   Title Pt will be able to report a reduction in pain in low back, L hip with normal ADLs to </= 2/10 most days of the week.    Status On-going             PT Long Term Goals - 04/30/20 1443  PT LONG TERM GOAL #1   Title Patient will able to increase her FOTO score to 58% or more upon discharge    Time 8    Period Weeks    Status New    Target Date 06/25/20      PT LONG TERM GOAL #2   Title Patient will be able to improve sleep quality, comfort/positioning to min disturbance overall.    Time 8    Period Weeks    Status New    Target Date 06/25/20      PT LONG TERM GOAL #3   Title Patient will be able to sit for 45-60 min with min increase in back, hip pain for work activities    Time 8    Period Weeks    Status New    Target Date 06/25/20      PT LONG TERM GOAL #4   Title Patient will be able to safely lift, squat up to 25 lbs from the floor for long term strength and function.    Time 8    Period Weeks    Status New    Target Date 06/25/20      PT LONG TERM GOAL #5   Title Pt will be I with HEP as of last visit for core stability    Time 8    Period Weeks    Status New    Target Date 06/25/20                 Plan - 05/15/20 1238    Clinical Impression Statement Pt has done some of her  exercises.  She is enjoying Pilates, noted mild difficulty with standin/lunge and planking.  May benefit from manual to improve the SIJ , post hip pain she is having.    PT Treatment/Interventions ADLs/Self Care Home Management;Cryotherapy;Patient/family education;Therapeutic exercise;Manual techniques;Aquatic Therapy;Moist Heat;Functional mobility training;Therapeutic activities;Ultrasound;Electrical Stimulation;Dry needling;Neuromuscular re-education;Balance training;Taping    PT Next Visit Plan Pilates, save time for tennis ball and manual , consider tape    PT Home Exercise Plan [email or web address]    Consulted and Agree with Plan of Care Patient           Patient will benefit from skilled therapeutic intervention in order to improve the following deficits and impairments:  Pain, Postural dysfunction, Impaired UE functional use, Impaired flexibility, Increased fascial restricitons, Hypermobility, Decreased strength, Decreased activity tolerance, Dizziness, Difficulty walking, Increased muscle spasms, Decreased mobility, Cardiopulmonary status limiting activity  Visit Diagnosis: Hypermobility syndrome  POTS (postural orthostatic tachycardia syndrome)  Muscle weakness (generalized)  Chronic left-sided low back pain without sciatica     Problem List Patient Active Problem List   Diagnosis Date Noted  . 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 POTS (postural orthostatic tachycardia syndrome) 01/17/2020  . Ehlers-Danlos syndrome type III 09/06/2019    Kendrell Lottman 05/15/2020, 12:41 PM  [place], [place], [address] Phone: [phone]   Fax:  [phone]  Name: Betty Lewis MRN: [id number] Date of Birth: 06-20-1986  [name], PT 05/15/20 12:43 PM Phone: [phone] Fax: [phone]

## 2020-05-16 ENCOUNTER — Ambulatory Visit: Payer: BC Managed Care – PPO | Admitting: Internal Medicine

## 2020-05-17 ENCOUNTER — Other Ambulatory Visit: Payer: Self-pay | Admitting: *Deleted

## 2020-05-17 MED ORDER — TRAMADOL HCL 50 MG PO TABS: ORAL_TABLET | ORAL | 0 refills | Status: DC

## 2020-05-17 NOTE — Telephone Encounter (Signed)
Refilled for Dr. Oneida Alar as he's out of the office.

## 2020-05-17 NOTE — Addendum Note (Signed)
Addended by: Dene Gentry on: 05/17/2020 10:39 AM   Modules accepted: Orders

## 2020-05-18 ENCOUNTER — Other Ambulatory Visit: Payer: Self-pay

## 2020-05-18 ENCOUNTER — Ambulatory Visit: Payer: BC Managed Care – PPO | Admitting: Physical Therapy

## 2020-05-18 DIAGNOSIS — I498 Other specified cardiac arrhythmias: Secondary | ICD-10-CM | POA: Diagnosis not present

## 2020-05-18 DIAGNOSIS — M545 Low back pain, unspecified: Secondary | ICD-10-CM

## 2020-05-18 DIAGNOSIS — G8929 Other chronic pain: Secondary | ICD-10-CM | POA: Diagnosis not present

## 2020-05-18 DIAGNOSIS — M357 Hypermobility syndrome: Secondary | ICD-10-CM | POA: Diagnosis not present

## 2020-05-18 DIAGNOSIS — M6281 Muscle weakness (generalized): Secondary | ICD-10-CM | POA: Diagnosis not present

## 2020-05-18 DIAGNOSIS — G90A Postural orthostatic tachycardia syndrome (POTS): Secondary | ICD-10-CM

## 2020-05-18 NOTE — Therapy (Signed)
Betty Lewis, [place], [address] Phone: [phone]   Fax:  [phone]  Physical Therapy Treatment  Patient Details  Name: Betty Lewis MRN: [id number] Date of Birth: 1986/04/07 Referring Provider (PT): Dr. Stefanie Libel    Encounter Date: 05/18/2020   PT End of Session - 05/18/20 0828    Visit Number 5    Number of Visits 16    Date for PT Re-Evaluation 06/25/20    Authorization Type BCBS    PT Start Time 0830    PT Stop Time 0915    PT Time Calculation (min) 45 min    Activity Tolerance Patient tolerated treatment well    Behavior During Therapy [facility] for tasks assessed/performed           Past Medical History:  Diagnosis Date  . Bulging lumbar disc    X 2  . PONV (postoperative nausea and vomiting)     Past Surgical History:  Procedure Laterality Date  . HIP ARTHROSCOPY Left 07/28/2019  . HYSTEROSCOPY N/A 11/06/2017   Procedure: HYSTEROSCOPY;  Surgeon: Janyth Pupa, DO;  Location: [facility];  Service: Gynecology;  Laterality: N/A;  . IUD REMOVAL N/A 11/06/2017   Procedure: INTRAUTERINE DEVICE (IUD) REMOVAL;  Surgeon: Janyth Pupa, DO;  Location: [facility];  Service: Gynecology;  Laterality: N/A;  . LEEP  2013  . TONSILLECTOMY  2005  . [place] EXTRACTION  2004    There were no vitals filed for this visit.   Subjective Assessment - 05/18/20 0834    Subjective I have had 2 really good days.  I'm hoping it is from the PT.  I do better in the dry cold air.    Currently in Pain? Yes    Pain Score 2     Pain Location Hip    Pain Orientation Right;Left    Pain Descriptors / Indicators Sore    Pain Type Chronic pain    Pain Onset More than a month ago    Pain Frequency Constant    Aggravating Factors  lifting, sitting    Pain Relieving Factors changing positiong, meds, uses TENS rarely            [facility] Adult PT Treatment/Exercise - 05/18/20 0001      Lumbar Exercises: Stretches   Hip  Flexor Stretch Limitations on foam roller with opposite knee to chest       Lumbar Exercises: Aerobic   Stationary Bike 5 min L2       Lumbar Exercises: Supine   Dead Bug 10 reps    Dead Bug Limitations foam roller     Basic Lumbar Stabilization Limitations clam, march , SLR x 10-15     Advanced Lumbar Stabilization Limitations unilateral bent knee fall out     Other Supine Lumbar Exercises foam roller exercises: alternating arms , overhead lift with green band , horizontal abduction green band     Other Supine Lumbar Exercises also done lumbar stab on foam roller       Lumbar Exercises: Sidelying   Clam Both;20 reps    Clam Limitations green band     Hip Abduction Both;20 reps    Hip Abduction Weights (lbs) green band added extension diagonal                     PT Short Term Goals - 05/10/20 1022      PT SHORT TERM GOAL #1   Title Pt will be  I with iniital HEP for lumbopelvic stability    Status On-going      PT SHORT TERM GOAL #2   Title Pt will be able to complete and understand objective measures, results and projections.    Status On-going      PT SHORT TERM GOAL #3   Title Pt will be able to report a reduction in pain in low back, L hip with normal ADLs to </= 2/10 most days of the week.    Status On-going             PT Long Term Goals - 04/30/20 1443      PT LONG TERM GOAL #1   Title Patient will able to increase her FOTO score to 58% or more upon discharge    Time 8    Period Weeks    Status New    Target Date 06/25/20      PT LONG TERM GOAL #2   Title Patient will be able to improve sleep quality, comfort/positioning to min disturbance overall.    Time 8    Period Weeks    Status New    Target Date 06/25/20      PT LONG TERM GOAL #3   Title Patient will be able to sit for 45-60 min with min increase in back, hip pain for work activities    Time 8    Period Weeks    Status New    Target Date 06/25/20      PT LONG TERM GOAL #4   Title  Patient will be able to safely lift, squat up to 25 lbs from the floor for long term strength and function.    Time 8    Period Weeks    Status New    Target Date 06/25/20      PT LONG TERM GOAL #5   Title Pt will be I with HEP as of last visit for core stability    Time 8    Period Weeks    Status New    Target Date 06/25/20                 Plan - 05/18/20 1660    Clinical Impression Statement Worked at [facility] level to challenge core stability and hip strength.  Able to do with min cueing and shows good awarness of core control.    PT Treatment/Interventions ADLs/Self Care Home Management;Cryotherapy;Patient/family education;Therapeutic exercise;Manual techniques;Aquatic Therapy;Moist Heat;Functional mobility training;Therapeutic activities;Ultrasound;Electrical Stimulation;Dry needling;Neuromuscular re-education;Balance training;Taping    PT Next Visit Plan cont Pilates, begin hip hinging light wgt    PT Home Exercise Plan [id number]           Patient will benefit from skilled therapeutic intervention in order to improve the following deficits and impairments:  Pain, Postural dysfunction, Impaired UE functional use, Impaired flexibility, Increased fascial restricitons, Hypermobility, Decreased strength, Decreased activity tolerance, Dizziness, Difficulty walking, Increased muscle spasms, Decreased mobility, Cardiopulmonary status limiting activity  Visit Diagnosis: Hypermobility syndrome  POTS (postural orthostatic tachycardia syndrome)  Muscle weakness (generalized)  Chronic left-sided low back pain without sciatica     Problem List Patient Active Problem List   Diagnosis Date Noted  . 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 POTS (postural orthostatic tachycardia syndrome) 01/17/2020  . Ehlers-Danlos syndrome type III 09/06/2019    Betty Lewis 05/18/2020, 10:20 AM  [place], [place], [address] Phone: [phone]   Fax:  [phone]  Name: Betty Lewis MRN: [id number] Date of Birth: March 04, 1986  [name], PT 05/18/20 10:23 AM Phone: [phone] Fax: [phone]

## 2020-05-21 ENCOUNTER — Encounter: Payer: Self-pay | Admitting: Physical Therapy

## 2020-05-21 ENCOUNTER — Ambulatory Visit: Payer: BC Managed Care – PPO | Admitting: Physical Therapy

## 2020-05-21 ENCOUNTER — Other Ambulatory Visit: Payer: Self-pay

## 2020-05-21 ENCOUNTER — Other Ambulatory Visit: Payer: Self-pay | Admitting: *Deleted

## 2020-05-21 DIAGNOSIS — M6281 Muscle weakness (generalized): Secondary | ICD-10-CM

## 2020-05-21 DIAGNOSIS — G8929 Other chronic pain: Secondary | ICD-10-CM

## 2020-05-21 DIAGNOSIS — M545 Low back pain, unspecified: Secondary | ICD-10-CM

## 2020-05-21 DIAGNOSIS — M357 Hypermobility syndrome: Secondary | ICD-10-CM | POA: Diagnosis not present

## 2020-05-21 DIAGNOSIS — I498 Other specified cardiac arrhythmias: Secondary | ICD-10-CM | POA: Diagnosis not present

## 2020-05-21 DIAGNOSIS — G90A Postural orthostatic tachycardia syndrome (POTS): Secondary | ICD-10-CM

## 2020-05-21 NOTE — Therapy (Signed)
[place], [place], [address] Phone: [phone]   Fax:  [phone]  Physical Therapy Treatment  Patient Details  Name: Betty Lewis MRN: [id number] Date of Birth: 14-Apr-1986 Referring Provider (PT): Dr. Stefanie Libel    Encounter Date: 05/21/2020   PT End of Session - 05/21/20 0925    Visit Number 6    Number of Visits 16    Date for PT Re-Evaluation 06/25/20    Authorization Type BCBS    PT Start Time 0918    PT Stop Time 1008    PT Time Calculation (min) 50 min    Activity Tolerance Patient tolerated treatment well    Behavior During Therapy [facility] for tasks assessed/performed           Past Medical History:  Diagnosis Date  . Bulging lumbar disc    X 2  . PONV (postoperative nausea and vomiting)     Past Surgical History:  Procedure Laterality Date  . HIP ARTHROSCOPY Left 07/28/2019  . HYSTEROSCOPY N/A 11/06/2017   Procedure: HYSTEROSCOPY;  Surgeon: Janyth Pupa, DO;  Location: [facility];  Service: Gynecology;  Laterality: N/A;  . IUD REMOVAL N/A 11/06/2017   Procedure: INTRAUTERINE DEVICE (IUD) REMOVAL;  Surgeon: Janyth Pupa, DO;  Location: [facility];  Service: Gynecology;  Laterality: N/A;  . LEEP  2013  . TONSILLECTOMY  2005  . [place] EXTRACTION  2004    There were no vitals filed for this visit.   Subjective Assessment - 05/21/20 0920    Subjective I was bad, I helped lift heavy bags of leaves this weekend.  Pain L low back  4/10.    Currently in Pain? Yes    Pain Score 4     Pain Location Back    Pain Orientation Left;Lower    Pain Descriptors / Indicators Sore    Pain Type Chronic pain    Pain Onset More than a month ago    Pain Frequency Constant             Pilates Reformer used for LE/core strength, postural strength, lumbopelvic disassociation and core control.  Exercises included: Footwork double leg   Heels, Arches and Toes  Heel raise  All x 15 done in  parallel.    Single leg in sidelying 1 Red 1 Blue  Parallel, heel and toes x 15 each    Good alignment, min cues for engaging core in maintain stability       [facility] Adult PT Treatment/Exercise - 05/21/20 0001      Pilates   Pilates Reformer see notes       Moist Heat Therapy   Number Minutes Moist Heat 10 Minutes    Moist Heat Location Lumbar Spine      Manual Therapy   Soft tissue mobilization low lumbar bilateral, lateral to SIJ     Myofascial Release lumbosacral     Kinesiotex Ligament Correction      Kinesiotix   Ligament Correction Rt thumb support, 1 "Y" and then perpendicular with 100% stretch over saddle joint                     PT Short Term Goals - 05/10/20 1022      PT SHORT TERM GOAL #1   Title Pt will be I with iniital HEP for lumbopelvic stability    Status On-going      PT SHORT TERM GOAL #2   Title Pt  will be able to complete and understand objective measures, results and projections.    Status On-going      PT SHORT TERM GOAL #3   Title Pt will be able to report a reduction in pain in low back, L hip with normal ADLs to </= 2/10 most days of the week.    Status On-going             PT Long Term Goals - 04/30/20 1443      PT LONG TERM GOAL #1   Title Patient will able to increase her FOTO score to 58% or more upon discharge    Time 8    Period Weeks    Status New    Target Date 06/25/20      PT LONG TERM GOAL #2   Title Patient will be able to improve sleep quality, comfort/positioning to min disturbance overall.    Time 8    Period Weeks    Status New    Target Date 06/25/20      PT LONG TERM GOAL #3   Title Patient will be able to sit for 45-60 min with min increase in back, hip pain for work activities    Time 8    Period Weeks    Status New    Target Date 06/25/20      PT LONG TERM GOAL #4   Title Patient will be able to safely lift, squat up to 25 lbs from the floor for long term strength and function.    Time 8     Period Weeks    Status New    Target Date 06/25/20      PT LONG TERM GOAL #5   Title Pt will be I with HEP as of last visit for core stability    Time 8    Period Weeks    Status New    Target Date 06/25/20                 Plan - 05/21/20 0956    Clinical Impression Statement Patient had mild pain in L low lumbar today, she feels related to lifting bags of leaves for her mother.  Did some Pilates to reset core and engage deep abdominal muscles.  With manual she has no significant tension in parapsinals.  Tightness in her case may add benefit.  Skin with increased elasticity.  Had some R hip discomfort.  Taped Rt thumb as it felt unstable with her gripping, use of Rt hand for ADLs.    PT Treatment/Interventions ADLs/Self Care Home Management;Cryotherapy;Patient/family education;Therapeutic exercise;Manual techniques;Aquatic Therapy;Moist Heat;Functional mobility training;Therapeutic activities;Ultrasound;Electrical Stimulation;Dry needling;Neuromuscular re-education;Balance training;Taping    PT Next Visit Plan did tape help thumb? cont Pilates, FOTO status.  begin hip hinging light wgt and stabilization    PT Home Exercise Plan [email or web address]    Consulted and Agree with Plan of Care Patient           Patient will benefit from skilled therapeutic intervention in order to improve the following deficits and impairments:  Pain, Postural dysfunction, Impaired UE functional use, Impaired flexibility, Increased fascial restricitons, Hypermobility, Decreased strength, Decreased activity tolerance, Dizziness, Difficulty walking, Increased muscle spasms, Decreased mobility, Cardiopulmonary status limiting activity  Visit Diagnosis: Hypermobility syndrome  POTS (postural orthostatic tachycardia syndrome)  Muscle weakness (generalized)  Chronic left-sided low back pain without sciatica     Problem List Patient Active Problem List   Diagnosis Date Noted  . Degenerative tear of  acetabular  labrum of left hip 01/17/2020  . POTS (postural orthostatic tachycardia syndrome) 01/17/2020  . Ehlers-Danlos syndrome type III 09/06/2019    Betty Lewis 05/21/2020, 11:07 AM  [place], [place], [address] Phone: [phone]   Fax:  [phone]  Name: Betty Lewis MRN: [id number] Date of Birth: 31-May-1986  [name], PT 05/21/20 11:07 AM Phone: [phone] Fax: [phone]

## 2020-05-22 DIAGNOSIS — T17308D Unspecified foreign body in larynx causing other injury, subsequent encounter: Secondary | ICD-10-CM | POA: Diagnosis not present

## 2020-05-22 DIAGNOSIS — K5904 Chronic idiopathic constipation: Secondary | ICD-10-CM | POA: Diagnosis not present

## 2020-05-24 ENCOUNTER — Ambulatory Visit: Payer: BC Managed Care – PPO | Admitting: Physical Therapy

## 2020-05-24 ENCOUNTER — Other Ambulatory Visit: Payer: Self-pay

## 2020-05-24 DIAGNOSIS — M6281 Muscle weakness (generalized): Secondary | ICD-10-CM

## 2020-05-24 DIAGNOSIS — I498 Other specified cardiac arrhythmias: Secondary | ICD-10-CM

## 2020-05-24 DIAGNOSIS — G90A Postural orthostatic tachycardia syndrome (POTS): Secondary | ICD-10-CM

## 2020-05-24 DIAGNOSIS — M357 Hypermobility syndrome: Secondary | ICD-10-CM | POA: Diagnosis not present

## 2020-05-24 DIAGNOSIS — M545 Low back pain, unspecified: Secondary | ICD-10-CM

## 2020-05-24 DIAGNOSIS — G8929 Other chronic pain: Secondary | ICD-10-CM

## 2020-05-24 NOTE — Therapy (Signed)
[place], [place], [address] Phone: [phone]   Fax:  [phone]  Physical Therapy Treatment  Patient Details  Name: Betty Lewis MRN: [id number] Date of Birth: [birth year] Referring Provider (PT): Dr. Stefanie Libel    Encounter Date: 05/24/2020   PT End of Session - 05/24/20 1005    Visit Number 7    Number of Visits 16    Date for PT Re-Evaluation 06/25/20    Authorization Type BCBS    Authorization - Number of Visits 30    PT Start Time [phone]    PT Stop Time 1045    PT Time Calculation (min) 47 min    Activity Tolerance Patient tolerated treatment well    Behavior During Therapy [facility] for tasks assessed/performed           Past Medical History:  Diagnosis Date   Bulging lumbar disc    X 2   PONV (postoperative nausea and vomiting)     Past Surgical History:  Procedure Laterality Date   HIP ARTHROSCOPY Left 07/28/2019   HYSTEROSCOPY N/A 11/06/2017   Procedure: HYSTEROSCOPY;  Surgeon: Janyth Pupa, DO;  Location: [facility];  Service: Gynecology;  Laterality: N/A;   IUD REMOVAL N/A 11/06/2017   Procedure: INTRAUTERINE DEVICE (IUD) REMOVAL;  Surgeon: Janyth Pupa, DO;  Location: [facility];  Service: Gynecology;  Laterality: N/A;   LEEP  2013   TONSILLECTOMY  2005   WISDOM TOOTH EXTRACTION  2004    There were no vitals filed for this visit.       [place] Adult PT Treatment/Exercise - 05/24/20 0001      Pilates   Pilates Reformer see notes       Manual Therapy   Kinesiotex Ligament Correction      Kinesiotix   Ligament Correction Rt thumb support, 1 "Y" and then perpendicular with 100% stretch over saddle joint             Pilates Reformer used for LE/core strength, postural strength, lumbopelvic disassociation and core control.  Exercises included:  Footwork 2 Red 1 Blue with ball under sacrum   Double leg on heels in parallel and then in turnout  Single leg 2 Red 1 blue  for unilateral stability  Feet in Straps single leg 1 Red   Single leg in large loop hip/knee extension x 15 alternating   Figure 4 Arcs x 10   Seated Arms Roll 1 red x 5 added row x 10  Biceps 1 Red  X 8  Hinge x 8 then combo x 8   Goal post arms red spring x 10  Short Box Glutes   1 Red parallel and then hip ER , both x 15 reps . Fatigue in biceps  L hip weaker as it stabilizes    Mermaid 1 Red  X 5 each side      PT Short Term Goals - 05/10/20 1022      PT SHORT TERM GOAL #1   Title Pt will be I with iniital HEP for lumbopelvic stability    Status On-going      PT SHORT TERM GOAL #2   Title Pt will be able to complete and understand objective measures, results and projections.    Status On-going      PT SHORT TERM GOAL #3   Title Pt will be able to report a reduction in pain in low back, L hip with normal ADLs to </= 2/10 most  days of the week.    Status On-going             PT Long Term Goals - 04/30/20 1443      PT LONG TERM GOAL #1   Title Patient will able to increase her FOTO score to 58% or more upon discharge    Time 8    Period Weeks    Status New    Target Date 06/25/20      PT LONG TERM GOAL #2   Title Patient will be able to improve sleep quality, comfort/positioning to min disturbance overall.    Time 8    Period Weeks    Status New    Target Date 06/25/20      PT LONG TERM GOAL #3   Title Patient will be able to sit for 45-60 min with min increase in back, hip pain for work activities    Time 8    Period Weeks    Status New    Target Date 06/25/20      PT LONG TERM GOAL #4   Title Patient will be able to safely lift, squat up to 25 lbs from the floor for long term strength and function.    Time 8    Period Weeks    Status New    Target Date 06/25/20      PT LONG TERM GOAL #5   Title Pt will be I with HEP as of last visit for core stability    Time 8    Period Weeks    Status New    Target Date 06/25/20                  Plan - 05/24/20 1033    Clinical Impression Statement Patient reporting good pain control for the past 2 weeks.  She felt the tape to her thumb to be helpful, repeated for her today. Showed moderate difficulty in quadruped positon on Reformer, L hip tires when supporting for exercise.  POTS has been acting up, reports HR of 170 yesterday with simple ADLs.    PT Treatment/Interventions ADLs/Self Care Home Management;Cryotherapy;Patient/family education;Therapeutic exercise;Manual techniques;Aquatic Therapy;Moist Heat;Functional mobility training;Therapeutic activities;Ultrasound;Electrical Stimulation;Dry needling;Neuromuscular re-education;Balance training;Taping    PT Next Visit Plan cont Pilates, begin hip hinging , loading, stabilization    PT Home Exercise Plan [email or web address]    Consulted and Agree with Plan of Care Patient           Patient will benefit from skilled therapeutic intervention in order to improve the following deficits and impairments:  Pain, Postural dysfunction, Impaired UE functional use, Impaired flexibility, Increased fascial restricitons, Hypermobility, Decreased strength, Decreased activity tolerance, Dizziness, Difficulty walking, Increased muscle spasms, Decreased mobility, Cardiopulmonary status limiting activity  Visit Diagnosis: Hypermobility syndrome  POTS (postural orthostatic tachycardia syndrome)  Muscle weakness (generalized)  Chronic left-sided low back pain without sciatica     Problem List Patient Active Problem List   Diagnosis Date Noted   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

## 2020-05-24 NOTE — Patient Instructions (Signed)
Access Code: [email or web address]: [email or web address]: 11/11/2021Prepared by: Anderson Malta PaaExercises  Supine Bridge - 1 x daily - 4-5 x weekly - 3 sets - 10 reps - 2-3 seconds hold  Clamshell with Resistance - 1 x daily - 4-5 x weekly - 3 sets - 10 reps  Half Kneeling Hip Flexor Stretch with Chair - 1-2 x daily - 7 x weekly - 3 reps - 20-30 seconds hold  Supine Lower Trunk Rotation - 1-2 x daily - 7 x weekly - 5 reps - 10 seconds hold  Bird Dog - 1-2 x daily - 7 x weekly - 2 sets - 10 reps - 10 hold

## 2020-05-28 ENCOUNTER — Encounter: Payer: Self-pay | Admitting: Physical Therapy

## 2020-05-28 ENCOUNTER — Ambulatory Visit: Payer: BC Managed Care – PPO | Admitting: Physical Therapy

## 2020-05-28 ENCOUNTER — Other Ambulatory Visit: Payer: Self-pay

## 2020-05-28 DIAGNOSIS — I498 Other specified cardiac arrhythmias: Secondary | ICD-10-CM | POA: Diagnosis not present

## 2020-05-28 DIAGNOSIS — M545 Low back pain, unspecified: Secondary | ICD-10-CM | POA: Diagnosis not present

## 2020-05-28 DIAGNOSIS — F329 Major depressive disorder, single episode, unspecified: Secondary | ICD-10-CM | POA: Diagnosis not present

## 2020-05-28 DIAGNOSIS — M357 Hypermobility syndrome: Secondary | ICD-10-CM | POA: Diagnosis not present

## 2020-05-28 DIAGNOSIS — M6281 Muscle weakness (generalized): Secondary | ICD-10-CM

## 2020-05-28 DIAGNOSIS — G8929 Other chronic pain: Secondary | ICD-10-CM | POA: Diagnosis not present

## 2020-05-28 DIAGNOSIS — G90A Postural orthostatic tachycardia syndrome (POTS): Secondary | ICD-10-CM

## 2020-05-28 DIAGNOSIS — G901 Familial dysautonomia [Riley-Day]: Secondary | ICD-10-CM | POA: Insufficient documentation

## 2020-05-28 DIAGNOSIS — F4310 Post-traumatic stress disorder, unspecified: Secondary | ICD-10-CM | POA: Diagnosis not present

## 2020-05-28 DIAGNOSIS — I951 Orthostatic hypotension: Secondary | ICD-10-CM | POA: Insufficient documentation

## 2020-05-28 NOTE — Therapy (Signed)
[place], [place], [address] Phone: [phone]   Fax:  [phone]  Physical Therapy Treatment  Patient Details  Name: Betty Lewis MRN: [id number] Date of Birth: 11/29/1985 Referring Provider (PT): Dr. Stefanie Libel    Encounter Date: 05/28/2020   PT End of Session - 05/28/20 1010    Visit Number 8    Number of Visits 16    Date for PT Re-Evaluation 06/25/20    Authorization Type BCBS    PT Start Time 1000    PT Stop Time 1043    PT Time Calculation (min) 43 min    Activity Tolerance Patient tolerated treatment well    Behavior During Therapy [name] for tasks assessed/performed           Past Medical History:  Diagnosis Date  . Bulging lumbar disc    X 2  . PONV (postoperative nausea and vomiting)     Past Surgical History:  Procedure Laterality Date  . HIP ARTHROSCOPY Left 07/28/2019  . HYSTEROSCOPY N/A 11/06/2017   Procedure: HYSTEROSCOPY;  Surgeon: Janyth Pupa, DO;  Location: [facility];  Service: Gynecology;  Laterality: N/A;  . IUD REMOVAL N/A 11/06/2017   Procedure: INTRAUTERINE DEVICE (IUD) REMOVAL;  Surgeon: Janyth Pupa, DO;  Location: [facility];  Service: Gynecology;  Laterality: N/A;  . LEEP  2013  . TONSILLECTOMY  2005  . [place] EXTRACTION  2004    There were no vitals filed for this visit.   Subjective Assessment - 05/28/20 1004    Subjective I had a weird pain after the last session.  Its not there now. I was walking with a limp.    Currently in Pain? No/denies             [facility] Adult PT Treatment/Exercise - 05/28/20 0001      Lumbar Exercises: Standing   Functional Squats Limitations 15 lbs x 15     Lifting Limitations dead lift x 15 x 15 KB     Other Standing Lumbar Exercises single leg hip hinge UE support , able to do 5 without UE assist      Lumbar Exercises: Supine   Dead Bug 10 reps    Dead Bug Limitations variations with and without 6 lbs wgt. (bent  knee, knee extended     Bridge Limitations on Reformer 4 springs x 10     Straight Leg Raise 10 reps    Straight Leg Raises Limitations holding 6 lbs Wgt     Other Supine Lumbar Exercises lat pull over feet down x 10, table top x 10 (6 lbs )       Lumbar Exercises: Quadruped   Plank knees bent modified    5 x 10 sec      Kines[employer]   Ligament Correction McConnell tape to SIJ             Pilates Reformer used for LE/core strength, postural strength, lumbopelvic disassociation and core control.  Exercises included:  Footwork 2 red 1 blue 1 yellow   Green band on thighs in parallel, wide parallel and hip ER x 15 each   Bridging  same springs x 10  Added press out x 5   No pain or hip discomfort with this      PT Short Term Goals - 05/28/20 1010      PT SHORT TERM GOAL #1   Title Pt will be I with iniital HEP for lumbopelvic stability  PT SHORT TERM GOAL #2   Title Pt will be able to complete and understand objective measures, results and projections.    Status On-going      PT SHORT TERM GOAL #3   Title Pt will be able to report a reduction in pain in low back, L hip with normal ADLs to </= 2/10 most days of the week.    Status On-going             PT Long Term Goals - 04/30/20 1443      PT LONG TERM GOAL #1   Title Patient will able to increase her FOTO score to 58% or more upon discharge    Time 8    Period Weeks    Status New    Target Date 06/25/20      PT LONG TERM GOAL #2   Title Patient will be able to improve sleep quality, comfort/positioning to min disturbance overall.    Time 8    Period Weeks    Status New    Target Date 06/25/20      PT LONG TERM GOAL #3   Title Patient will be able to sit for 45-60 min with min increase in back, hip pain for work activities    Time 8    Period Weeks    Status New    Target Date 06/25/20      PT LONG TERM GOAL #4   Title Patient will be able to safely lift, squat up to 25 lbs from the floor for long  term strength and function.    Time 8    Period Weeks    Status New    Target Date 06/25/20      PT LONG TERM GOAL #5   Title Pt will be I with HEP as of last visit for core stability    Time 8    Period Weeks    Status New    Target Date 06/25/20                 Plan - 05/28/20 1048    Clinical Impression Statement Patient with increased pain after last visit, may be related to more advanced glute /core ex on elbows.  Taped today for increased stability.  Mild difficulty with weightbearing on wrists which she is used to, modifed to be on fists.  She continues to benefit from skilled PT to increase strength and stability of core with modifications for POTS, peripheral joints.    PT Treatment/Interventions ADLs/Self Care Home Management;Cryotherapy;Patient/family education;Therapeutic exercise;Manual techniques;Aquatic Therapy;Moist Heat;Functional mobility training;Therapeutic activities;Ultrasound;Electrical Stimulation;Dry needling;Neuromuscular re-education;Balance training;Taping    PT Next Visit Plan cont Pilates, hip hinging , loading, stabilization. stretch hip? anti-rotation for upright core, tall kneeling    PT Home Exercise Plan [id number]    Consulted and Agree with Plan of Care Patient           Patient will benefit from skilled therapeutic intervention in order to improve the following deficits and impairments:  Pain, Postural dysfunction, Impaired UE functional use, Impaired flexibility, Increased fascial restricitons, Hypermobility, Decreased strength, Decreased activity tolerance, Dizziness, Difficulty walking, Increased muscle spasms, Decreased mobility, Cardiopulmonary status limiting activity  Visit Diagnosis: Hypermobility syndrome  Muscle weakness (generalized)  Chronic left-sided low back pain without sciatica  POTS (postural orthostatic tachycardia syndrome)     Problem List Patient Active Problem List   Diagnosis Date Noted  . 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 POTS (  postural orthostatic tachycardia syndrome) 01/17/2020  . Ehlers-Danlos syndrome type III 09/06/2019    Yvonna Brun 05/28/2020, 10:52 AM  [place], [place], [address] Phone: [phone]   Fax:  [phone]  Name: Betty Lewis MRN: [id number] Date of Birth: Nov 18, 1985  [name], PT 05/28/20 11:54 AM Phone: [phone] Fax: [phone]

## 2020-05-30 ENCOUNTER — Encounter: Payer: Self-pay | Admitting: Physical Therapy

## 2020-05-30 ENCOUNTER — Ambulatory Visit: Payer: BC Managed Care – PPO | Admitting: Physical Therapy

## 2020-05-30 ENCOUNTER — Other Ambulatory Visit: Payer: Self-pay

## 2020-05-30 DIAGNOSIS — G90A Postural orthostatic tachycardia syndrome (POTS): Secondary | ICD-10-CM

## 2020-05-30 DIAGNOSIS — G8929 Other chronic pain: Secondary | ICD-10-CM

## 2020-05-30 DIAGNOSIS — M6281 Muscle weakness (generalized): Secondary | ICD-10-CM

## 2020-05-30 DIAGNOSIS — M357 Hypermobility syndrome: Secondary | ICD-10-CM

## 2020-05-30 DIAGNOSIS — I498 Other specified cardiac arrhythmias: Secondary | ICD-10-CM | POA: Diagnosis not present

## 2020-05-30 DIAGNOSIS — M545 Low back pain, unspecified: Secondary | ICD-10-CM

## 2020-05-30 NOTE — Therapy (Signed)
[place], [place], [address] Phone: [phone]   Fax:  [phone]  Physical Therapy Treatment  Patient Details  Name: Betty Lewis MRN: [id number] Date of Birth: 06/30/1986 Referring Provider (PT): Dr. Stefanie Libel    Encounter Date: 05/30/2020   PT End of Session - 05/30/20 1014    Visit Number 9    Number of Visits 16    Date for PT Re-Evaluation 06/25/20    Authorization Type BCBS    Authorization - Number of Visits 30    PT Start Time 1856    PT Stop Time 1045    PT Time Calculation (min) 43 min           Past Medical History:  Diagnosis Date  . Bulging lumbar disc    X 2  . PONV (postoperative nausea and vomiting)     Past Surgical History:  Procedure Laterality Date  . HIP ARTHROSCOPY Left 07/28/2019  . HYSTEROSCOPY N/A 11/06/2017   Procedure: HYSTEROSCOPY;  Surgeon: Janyth Pupa, DO;  Location: [facility];  Service: Gynecology;  Laterality: N/A;  . IUD REMOVAL N/A 11/06/2017   Procedure: INTRAUTERINE DEVICE (IUD) REMOVAL;  Surgeon: Janyth Pupa, DO;  Location: [facility];  Service: Gynecology;  Laterality: N/A;  . LEEP  2013  . TONSILLECTOMY  2005  . [place] EXTRACTION  2004    There were no vitals filed for this visit.   Subjective Assessment - 05/30/20 1012    Subjective I was in a lot of pain yesterday.  Tape did not stay long.  5/10 L low back.    Currently in Pain? Yes    Pain Score 5     Pain Location Back    Pain Orientation Left;Lower    Pain Descriptors / Indicators Sore    Pain Type Chronic pain    Pain Onset More than a month ago    Pain Frequency Intermittent               [facility] Adult PT Treatment/Exercise - 05/30/20 0001      Lumbar Exercises: Aerobic   Stationary Bike L2 for 5 min       Lumbar Exercises: Quadruped   Other Quadruped Lumbar Exercises childs pose end of session post manual       Manual Therapy   Soft tissue mobilization L quadratus  lumborum, paraspinals, glute medius     Myofascial Release L trunk, hip, back             Pilates Reformer used for LE/core strength, postural strength, lumbopelvic disassociation and core control.  Exercises included:  Footwork 2 red 1 blue   Parallel and turnout x 15 each   Supine Arms 1 Red 1 yellow Arcs in parallel and then in T position   Feet in Straps single leg  1 red arcs in figure 4 and then single leg stretch    Clam blue band x 20 alternating bent knee fall out   Bridging 2 red 1 blue   Smaller ROM with blue band         PT Short Term Goals - 05/30/20 1234      PT SHORT TERM GOAL #1   Title Pt will be I with iniital HEP for lumbopelvic stability    Status On-going      PT SHORT TERM GOAL #2   Title Pt will be able to complete and understand objective measures, results and projections.  Status Achieved      PT SHORT TERM GOAL #3   Title Pt will be able to report a reduction in pain in low back, L hip with normal ADLs to </= 2/10 most days of the week.    Baseline recent pain flare    Status On-going             PT Long Term Goals - 05/30/20 1235      PT LONG TERM GOAL #1   Title Patient will able to increase her FOTO score to 58% or more upon discharge    Status On-going      PT LONG TERM GOAL #2   Title Patient will be able to improve sleep quality, comfort/positioning to min disturbance overall.    Status On-going      PT LONG TERM GOAL #3   Title Patient will be able to sit for 45-60 min with min increase in back, hip pain for work activities    Status On-going      PT [place] #4   Title Patient will be able to safely lift, squat up to 25 lbs from the floor for long term strength and function.    Status On-going      PT LONG TERM GOAL #5   Title Pt will be I with HEP as of last visit for core stability    Status On-going                 Plan - 05/30/20 1235    Clinical Impression Statement Patient cont to have a mild  flare up of back pain since last visit. 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  Offered manual to L low back , hip which did help somewhat.    PT Treatment/Interventions ADLs/Self Care Home Management;Cryotherapy;Patient/family education;Therapeutic exercise;Manual techniques;Aquatic Therapy;Moist Heat;Functional mobility training;Therapeutic activities;Ultrasound;Electrical Stimulation;Dry needling;Neuromuscular re-education;Balance training;Taping    PT Next Visit Plan cont Pilates, hip hinging , loading, stabilization. stretch hip? anti-rotation for upright core, tall kneeling    PT Home Exercise Plan [id number]    Consulted and Agree with Plan of Care Patient           Patient will benefit from skilled therapeutic intervention in order to improve the following deficits and impairments:  Pain, Postural dysfunction, Impaired UE functional use, Impaired flexibility, Increased fascial restricitons, Hypermobility, Decreased strength, Decreased activity tolerance, Dizziness, Difficulty walking, Increased muscle spasms, Decreased mobility, Cardiopulmonary status limiting activity  Visit Diagnosis: Hypermobility syndrome  Muscle weakness (generalized)  Chronic left-sided low back pain without sciatica  POTS (postural orthostatic tachycardia syndrome)     Problem List Patient Active Problem List   Diagnosis Date Noted  . Orthostatic intolerance 05/28/2020  . Dysautonomia ([place]) 05/28/2020  . 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 POTS (postural orthostatic tachycardia syndrome) 01/17/2020  . Ehlers-Danlos syndrome type III 09/06/2019    Reshad Saab 05/30/2020, 12:38 PM  [place], [place], [address] Phone: [phone]   Fax:  [phone]  Name: Betty Lewis MRN: [id number] Date of Birth: October 22, 1985  [name], PT 05/30/20 12:38 PM Phone:  [phone] Fax: [phone]

## 2020-05-31 ENCOUNTER — Ambulatory Visit: Payer: BC Managed Care – PPO | Admitting: Internal Medicine

## 2020-05-31 ENCOUNTER — Encounter: Payer: Self-pay | Admitting: Internal Medicine

## 2020-05-31 DIAGNOSIS — G901 Familial dysautonomia [Riley-Day]: Secondary | ICD-10-CM | POA: Diagnosis not present

## 2020-05-31 DIAGNOSIS — I951 Orthostatic hypotension: Secondary | ICD-10-CM

## 2020-05-31 MED ORDER — PINDOLOL 5 MG PO TABS: 5.0000 mg | ORAL_TABLET | Freq: Two times a day (BID) | ORAL | 3 refills | Status: DC

## 2020-05-31 NOTE — Progress Notes (Signed)
      Patient Care Team: Glenis Smoker, MD as PCP - General (Family Medicine) Sueanne Margarita, MD as PCP - Cardiology (Cardiology)   HPI  Betty Lewis is a 34 y.o. female seen in follow-up for orthostatic intolerance without quite making the criteria for POTS in the setting of joint laxity/EDS syndrome.  Also has struggled with hip pain aggravating sleep  Sleep has been better with physical therapy.  Stress has been better also as school approaches the end.  Still with lightheadedness is episodic and unpredictable.  Salt and volume repletion has been sporadic.  Limited somewhat by cost   Records and Results Reviewed   Past Medical History:  Diagnosis Date  . Bulging lumbar disc    X 2  . PONV (postoperative nausea and vomiting)     Past Surgical History:  Procedure Laterality Date  . HIP ARTHROSCOPY Left 07/28/2019  . HYSTEROSCOPY N/A 11/06/2017   Procedure: HYSTEROSCOPY;  Surgeon: Janyth Pupa, DO;  Location: [facility];  Service: Gynecology;  Laterality: N/A;  . IUD REMOVAL N/A 11/06/2017   Procedure: INTRAUTERINE DEVICE (IUD) REMOVAL;  Surgeon: Janyth Pupa, DO;  Location: [facility];  Service: Gynecology;  Laterality: N/A;  . LEEP  2013  . TONSILLECTOMY  2005  . [place] EXTRACTION  2004    Current Meds  Medication Sig  . levonorgestrel-ethinyl estradiol (ALESSE) 0.1-20 MG-MCG tablet Take 1 tablet by mouth daily.   . ondansetron (ZOFRAN) 4 MG tablet Take 1 tablet (4 mg total) by mouth every 6 (six) hours as needed for nausea or vomiting.  . pindolol (VISKEN) 5 MG tablet Take 1 tablet by mouth twice daily  . traMADol (ULTRAM) 50 MG tablet Take 1-2 tabs every 12 hours as needed for pain    Allergies  Allergen Reactions  . Codeine     MAKES HER DIZZY AND DISORIENTED      Review of Systems negative except from HPI and PMH  Physical Exam BP 115/77   Pulse 74   Ht 5\' 6"  (1.676 m)   Wt 145 lb (65.8 kg) Comment: patient refused to weigh. patient  stated she weigh 145 pds  SpO2 98%   BMI 23.40 kg/m  Well developed and well nourished in no acute distress HENT normal E scleral and icterus clear Neck Supple JVP flat; carotids brisk and full Clear to ausculation Regular rate and rhythm, no murmurs gallops or rub Soft with active bowel sounds No clubbing cyanosis  Edema Alert and oriented, grossly normal motor and sensory function Skin Warm and Dry  ECG    CrCl cannot be calculated (Patient's most recent lab result is older than the maximum 21 days allowed.).   Assessment and  Plan  Dysautonomia with criteria mostly for orthostatic intolerance  Joint laxity syndrome  GI issues including nausea and constipation  Hip pain impairing sleep  Stress   Somewhat improved.  Less pain.  Sleeping better this is important.  Discussed again the importance of volume and salt repletion.  She will be looking for home remedies/recipes given the cost of repletion beverages  Discussed the use of compressive wear particularly when anticipating prolonged standing; they are quite uncomfortable otherwise.  Working on the mental health aspects of chronic illness     Current medicines are reviewed at length with the patient today .  The patient does not  have concerns regarding medicines.

## 2020-05-31 NOTE — Patient Instructions (Addendum)
Medication Instructions:  Your physician recommends that you continue on your current medications as directed. Please refer to the Current Medication list given to you today.  *If you need a refill on your cardiac medications before your next appointment, please call your pharmacy*   Lab Work: None ordered.  If you have labs (blood work) drawn today and your tests are completely normal, you will receive your results only by:  [place] (if you have MyChart) OR  A paper copy in the mail If you have any lab test that is abnormal or we need to change your treatment, we will call you to review the results.   Testing/Procedures: None ordered.    Follow-Up: At [facility], you and your health needs are our priority.  As part of our continuing mission to provide you with exceptional heart care, we have created designated Provider Care Teams.  These Care Teams include your primary Cardiologist (physician) and Advanced Practice Providers (APPs -  Physician Assistants and Nurse Practitioners) who all work together to provide you with the care you need, when you need it.  We recommend signing up for the patient portal called "MyChart".  Sign up information is provided on this After Visit Summary.  MyChart is used to connect with patients for Virtual Visits (Telemedicine).  Patients are able to view lab/test results, encounter notes, upcoming appointments, etc.  Non-urgent messages can be sent to your provider as well.   To learn more about what you can do with MyChart, go to [email or web address].    Your next appointment:  Virtual Visit 09/06/2020 at 345pm with Dr Caryl Comes

## 2020-06-04 ENCOUNTER — Ambulatory Visit: Payer: BC Managed Care – PPO | Admitting: Physical Therapy

## 2020-06-04 ENCOUNTER — Other Ambulatory Visit: Payer: Self-pay

## 2020-06-04 DIAGNOSIS — I498 Other specified cardiac arrhythmias: Secondary | ICD-10-CM

## 2020-06-04 DIAGNOSIS — M357 Hypermobility syndrome: Secondary | ICD-10-CM | POA: Diagnosis not present

## 2020-06-04 DIAGNOSIS — F4310 Post-traumatic stress disorder, unspecified: Secondary | ICD-10-CM | POA: Diagnosis not present

## 2020-06-04 DIAGNOSIS — M545 Low back pain, unspecified: Secondary | ICD-10-CM

## 2020-06-04 DIAGNOSIS — G8929 Other chronic pain: Secondary | ICD-10-CM

## 2020-06-04 DIAGNOSIS — F329 Major depressive disorder, single episode, unspecified: Secondary | ICD-10-CM | POA: Diagnosis not present

## 2020-06-04 DIAGNOSIS — M6281 Muscle weakness (generalized): Secondary | ICD-10-CM | POA: Diagnosis not present

## 2020-06-04 DIAGNOSIS — G90A Postural orthostatic tachycardia syndrome (POTS): Secondary | ICD-10-CM

## 2020-06-04 NOTE — Therapy (Signed)
[place], [place], [address] Phone: [phone]   Fax:  [phone]  Physical Therapy Treatment  Patient Details  Name: Betty Lewis MRN: [id number] Date of Birth: 03/12/1986 Referring Provider (PT): Dr. Stefanie Libel    Encounter Date: 06/04/2020   PT End of Session - 06/04/20 1112    Visit Number 10    Number of Visits 16    Date for PT Re-Evaluation 06/25/20    Authorization Type BCBS    PT Start Time 1045    PT Stop Time 1138    PT Time Calculation (min) 53 min    Activity Tolerance Patient tolerated treatment well           Past Medical History:  Diagnosis Date  . Bulging lumbar disc    X 2  . PONV (postoperative nausea and vomiting)     Past Surgical History:  Procedure Laterality Date  . HIP ARTHROSCOPY Left 07/28/2019  . HYSTEROSCOPY N/A 11/06/2017   Procedure: HYSTEROSCOPY;  Surgeon: Janyth Pupa, DO;  Location: [facility];  Service: Gynecology;  Laterality: N/A;  . IUD REMOVAL N/A 11/06/2017   Procedure: INTRAUTERINE DEVICE (IUD) REMOVAL;  Surgeon: Janyth Pupa, DO;  Location: [facility];  Service: Gynecology;  Laterality: N/A;  . LEEP  2013  . TONSILLECTOMY  2005  . [place] EXTRACTION  2004    There were no vitals filed for this visit.   Subjective Assessment - 06/04/20 1106    Subjective The massage really helped last time.  Saw the cardiologist and he wants me to eat more salt.    Currently in Pain? Yes    Pain Score 3     Pain Location Generalized    Pain Orientation Other (Comment)   due to rainy weather   Pain Descriptors / Indicators Aching    Pain Type Chronic pain            [facility] Adult PT Treatment/Exercise - 06/04/20 0001      Lumbar Exercises: Aerobic   Recumbent Bike UE and LE for 6 min L 7       Lumbar Exercises: Supine   Bridge 20 reps    Bridge Limitations legs up on bolster     Bridge with clamshell 20 reps      Lumbar Exercises: Quadruped    Plank q-ped extension x 15 and hydrant    green band    Other Quadruped Lumbar Exercises 1/2 kneeling: arrow pull back with arm with rotation  and diagonal pull green band x 15     Other Quadruped Lumbar Exercises tall kneeling 8 lbs hinge  x10 and added shoulder press out x 10       Manual Therapy   Soft tissue mobilization L > R quadratus lumborum, paraspinals, glute medius     Myofascial Release L trunk, hip, back                     PT Short Term Goals - 05/30/20 1234      PT SHORT TERM GOAL #1   Title Pt will be I with iniital HEP for lumbopelvic stability    Status On-going      PT SHORT TERM GOAL #2   Title Pt will be able to complete and understand objective measures, results and projections.    Status Achieved      PT SHORT TERM GOAL #3   Title Pt will be able to report a  reduction in pain in low back, L hip with normal ADLs to </= 2/10 most days of the week.    Baseline recent pain flare    Status On-going             PT Long Term Goals - 05/30/20 1235      PT LONG TERM GOAL #1   Title Patient will able to increase her FOTO score to 58% or more upon discharge    Status On-going      PT LONG TERM GOAL #2   Title Patient will be able to improve sleep quality, comfort/positioning to min disturbance overall.    Status On-going      PT LONG TERM GOAL #3   Title Patient will be able to sit for 45-60 min with min increase in back, hip pain for work activities    Status On-going      PT [place] #4   Title Patient will be able to safely lift, squat up to 25 lbs from the floor for long term strength and function.    Status On-going      PT LONG TERM GOAL #5   Title Pt will be I with HEP as of last visit for core stability    Status On-going                 Plan - 06/04/20 1113    Clinical Impression Statement Patient with favorable response to manual PT last visit.  Tall kneeling and quadruped were a good challenge for her, was somewhat  short of breath so we modifed to supine.  She is overall feeling better and managing her pain more effectively.    PT Treatment/Interventions ADLs/Self Care Home Management;Cryotherapy;Patient/family education;Therapeutic exercise;Manual techniques;Aquatic Therapy;Moist Heat;Functional mobility training;Therapeutic activities;Ultrasound;Electrical Stimulation;Dry needling;Neuromuscular re-education;Balance training;Taping    PT Next Visit Plan cont Pilates, hip hinging , loading, stabilization. stretch hip? anti-rotation for upright core, tall kneeling    PT Home Exercise Plan [id number]    Consulted and Agree with Plan of Care Patient           Patient will benefit from skilled therapeutic intervention in order to improve the following deficits and impairments:  Pain, Postural dysfunction, Impaired UE functional use, Impaired flexibility, Increased fascial restricitons, Hypermobility, Decreased strength, Decreased activity tolerance, Dizziness, Difficulty walking, Increased muscle spasms, Decreased mobility, Cardiopulmonary status limiting activity  Visit Diagnosis: Hypermobility syndrome  Muscle weakness (generalized)  Chronic left-sided low back pain without sciatica  POTS (postural orthostatic tachycardia syndrome)     Problem List Patient Active Problem List   Diagnosis Date Noted  . Orthostatic intolerance 05/28/2020  . Dysautonomia ([place]) 05/28/2020  . 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 POTS (postural orthostatic tachycardia syndrome) 01/17/2020  . Ehlers-Danlos syndrome type III 09/06/2019    Daymond Cordts 06/04/2020, 12:33 PM  [place], [place], [address] Phone: [phone]   Fax:  [phone]  Name: CRYSTALYNN MCINERNEY MRN: [id number] Date of Birth: Feb 21, 1986  [name], PT 06/04/20 12:33 PM Phone: [phone] Fax: [phone]

## 2020-06-06 ENCOUNTER — Ambulatory Visit: Payer: BC Managed Care – PPO | Admitting: Physical Therapy

## 2020-06-06 ENCOUNTER — Encounter: Payer: Self-pay | Admitting: Physical Therapy

## 2020-06-06 ENCOUNTER — Other Ambulatory Visit: Payer: Self-pay

## 2020-06-06 DIAGNOSIS — M6281 Muscle weakness (generalized): Secondary | ICD-10-CM | POA: Diagnosis not present

## 2020-06-06 DIAGNOSIS — M357 Hypermobility syndrome: Secondary | ICD-10-CM

## 2020-06-06 DIAGNOSIS — G8929 Other chronic pain: Secondary | ICD-10-CM | POA: Diagnosis not present

## 2020-06-06 DIAGNOSIS — I498 Other specified cardiac arrhythmias: Secondary | ICD-10-CM

## 2020-06-06 DIAGNOSIS — M545 Low back pain, unspecified: Secondary | ICD-10-CM | POA: Diagnosis not present

## 2020-06-06 DIAGNOSIS — G90A Postural orthostatic tachycardia syndrome (POTS): Secondary | ICD-10-CM

## 2020-06-06 NOTE — Therapy (Signed)
[place], [place], [address] Phone: [phone]   Fax:  [phone]  Physical Therapy Treatment  Patient Details  Name: Betty Lewis MRN: [id number] Date of Birth: 10/11/1985 Referring Provider (PT): Dr. Stefanie Libel    Encounter Date: 06/06/2020   PT End of Session - 06/06/20 1010    Visit Number 11    Number of Visits 16    Date for PT Re-Evaluation 06/25/20    Authorization Type BCBS    Authorization - Number of Visits 30    PT Start Time 1000    PT Stop Time 1045    PT Time Calculation (min) 45 min    Activity Tolerance Patient tolerated treatment well    Behavior During Therapy [facility] for tasks assessed/performed           Past Medical History:  Diagnosis Date  . Bulging lumbar disc    X 2  . PONV (postoperative nausea and vomiting)     Past Surgical History:  Procedure Laterality Date  . HIP ARTHROSCOPY Left 07/28/2019  . HYSTEROSCOPY N/A 11/06/2017   Procedure: HYSTEROSCOPY;  Surgeon: Janyth Pupa, DO;  Location: [facility];  Service: Gynecology;  Laterality: N/A;  . IUD REMOVAL N/A 11/06/2017   Procedure: INTRAUTERINE DEVICE (IUD) REMOVAL;  Surgeon: Janyth Pupa, DO;  Location: [facility];  Service: Gynecology;  Laterality: N/A;  . LEEP  2013  . TONSILLECTOMY  2005  . [place] EXTRACTION  2004    There were no vitals filed for this visit.   Subjective Assessment - 06/06/20 1006    Subjective I have not had the stabbing pain in alot time.  She has had more POTS symptoms than usual due to her cycle.    Currently in Pain? No/denies           Pilates Reformer used for LE/core strength, postural strength, lumbopelvic disassociation and core control.  Exercises included:  Footwork 2 red 1 Green   Double leg heels in parallel, V and then wide V          Then on toes , wide and narrow Bridging   2 Red 1 blue   Circle on outer thighs x 10 and then press out x 5   Feet in  Straps   1 Red 1 blue   Arcs, squats with and without circle   Circles in hip ER x 5 each direction then x 5 hip IR   Scooter 1 red press back , lunge 2 x 10 each leg    Overhead press 1 red x 10, single arm x 10   Standing cat, downward dog for spine stretching      PT Short Term Goals - 05/30/20 1234      PT SHORT TERM GOAL #1   Title Pt will be I with iniital HEP for lumbopelvic stability    Status On-going      PT SHORT TERM GOAL #2   Title Pt will be able to complete and understand objective measures, results and projections.    Status Achieved      PT SHORT TERM GOAL #3   Title Pt will be able to report a reduction in pain in low back, L hip with normal ADLs to </= 2/10 most days of the week.    Baseline recent pain flare    Status On-going             PT Long Term Goals - 05/30/20  [place] #1   Title Patient will able to increase her FOTO score to 58% or more upon discharge    Status On-going      PT LONG TERM GOAL #2   Title Patient will be able to improve sleep quality, comfort/positioning to min disturbance overall.    Status On-going      PT LONG TERM GOAL #3   Title Patient will be able to sit for 45-60 min with min increase in back, hip pain for work activities    Status On-going      PT [place] #4   Title Patient will be able to safely lift, squat up to 25 lbs from the floor for long term strength and function.    Status On-going      PT LONG TERM GOAL #5   Title Pt will be I with HEP as of last visit for core stability    Status On-going                 Plan - 06/06/20 1230    Clinical Impression Statement Patient requested a more supine set of exercises, able to do so with Pilates equipment.  No increased pain during session and really has had no pain in SIJ fow weeks.  Did end session in standing for strengthening, needed help finding and maintiaining a neutral pelvis.    PT Treatment/Interventions  ADLs/Self Care Home Management;Cryotherapy;Patient/family education;Therapeutic exercise;Manual techniques;Aquatic Therapy;Moist Heat;Functional mobility training;Therapeutic activities;Ultrasound;Electrical Stimulation;Dry needling;Neuromuscular re-education;Balance training;Taping    PT Next Visit Plan cont Pilates, hip hinging , loading, stabilization. stretch hip? anti-rotation    PT Home Exercise Plan [email or web address]    Consulted and Agree with Plan of Care Patient           Patient will benefit from skilled therapeutic intervention in order to improve the following deficits and impairments:  Pain, Postural dysfunction, Impaired UE functional use, Impaired flexibility, Increased fascial restricitons, Hypermobility, Decreased strength, Decreased activity tolerance, Dizziness, Difficulty walking, Increased muscle spasms, Decreased mobility, Cardiopulmonary status limiting activity  Visit Diagnosis: Hypermobility syndrome  Muscle weakness (generalized)  Chronic left-sided low back pain without sciatica  POTS (postural orthostatic tachycardia syndrome)     Problem List Patient Active Problem List   Diagnosis Date Noted  . Orthostatic intolerance 05/28/2020  . Dysautonomia ([place]) 05/28/2020  . 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 POTS (postural orthostatic tachycardia syndrome) 01/17/2020  . Ehlers-Danlos syndrome type III 09/06/2019    Sascha Baugher 06/06/2020, 12:33 PM  [place], [place], [address] Phone: [phone]   Fax:  [phone]  Name: Betty Lewis MRN: [id number] Date of Birth: 1986-07-01  [name], PT 06/06/20 12:36 PM Phone: [phone] Fax: [phone]

## 2020-06-11 DIAGNOSIS — F329 Major depressive disorder, single episode, unspecified: Secondary | ICD-10-CM | POA: Diagnosis not present

## 2020-06-11 DIAGNOSIS — F4310 Post-traumatic stress disorder, unspecified: Secondary | ICD-10-CM | POA: Diagnosis not present

## 2020-06-12 ENCOUNTER — Other Ambulatory Visit: Payer: Self-pay

## 2020-06-12 ENCOUNTER — Encounter: Payer: Self-pay | Admitting: Physical Therapy

## 2020-06-12 ENCOUNTER — Ambulatory Visit: Payer: BC Managed Care – PPO | Admitting: Physical Therapy

## 2020-06-12 DIAGNOSIS — G8929 Other chronic pain: Secondary | ICD-10-CM

## 2020-06-12 DIAGNOSIS — M6281 Muscle weakness (generalized): Secondary | ICD-10-CM | POA: Diagnosis not present

## 2020-06-12 DIAGNOSIS — I498 Other specified cardiac arrhythmias: Secondary | ICD-10-CM

## 2020-06-12 DIAGNOSIS — M545 Low back pain, unspecified: Secondary | ICD-10-CM | POA: Diagnosis not present

## 2020-06-12 DIAGNOSIS — M357 Hypermobility syndrome: Secondary | ICD-10-CM | POA: Diagnosis not present

## 2020-06-12 DIAGNOSIS — G90A Postural orthostatic tachycardia syndrome (POTS): Secondary | ICD-10-CM

## 2020-06-12 NOTE — Patient Instructions (Signed)
Access Code: [email or web address]: [email or web address]: 11/30/2021Prepared by: Anderson Malta PaaExercises  Supine Transversus Abdominis Bracing - Hands on Stomach - 1 x daily - 7 x weekly - 2 sets - 10 reps - 5 hold  Supine Transversus Abdominis Bracing with Double Leg Fallout - 1 x daily - 7 x weekly - 2 sets - 10 reps - 5 hold  Pilates Bridge - 1 x daily - 7 x weekly - 2 sets - 10 reps - 5 hold  Clamshell - 1 x daily - 7 x weekly - 2 sets - 10 reps - 5 hold  Sidelying Hip Abduction - 1 x daily - 7 x weekly - 2 sets - 10 reps - 5 hold  Dead Bug on Foam Roll - 1 x daily - 7 x weekly - 2 sets - 10 reps - 5 hold

## 2020-06-12 NOTE — Therapy (Signed)
[place], [place], [address] Phone: [phone]   Fax:  [phone]  Physical Therapy Treatment  Patient Details  Name: Betty Lewis MRN: [id number] Date of Birth: February 22, 1986 Referring Provider (PT): Dr. Stefanie Libel    Encounter Date: 07-03-20   PT End of Session - 07/03/20 1042    Visit Number 12    Number of Visits 16    Date for PT Re-Evaluation 06/25/20    Authorization Type BCBS    Authorization - Number of Visits 30    PT Start Time 1000    PT Stop Time 1045    PT Time Calculation (min) 45 min    Activity Tolerance Patient tolerated treatment well    Behavior During Therapy [facility] for tasks assessed/performed           Past Medical History:  Diagnosis Date  . Bulging lumbar disc    X 2  . PONV (postoperative nausea and vomiting)     Past Surgical History:  Procedure Laterality Date  . HIP ARTHROSCOPY Left 07/28/2019  . HYSTEROSCOPY N/A 11/06/2017   Procedure: HYSTEROSCOPY;  Surgeon: Janyth Pupa, DO;  Location: [facility];  Service: Gynecology;  Laterality: N/A;  . IUD REMOVAL N/A 11/06/2017   Procedure: INTRAUTERINE DEVICE (IUD) REMOVAL;  Surgeon: Janyth Pupa, DO;  Location: [facility];  Service: Gynecology;  Laterality: N/A;  . LEEP  2013  . TONSILLECTOMY  2005  . [place] EXTRACTION  2004    There were no vitals filed for this visit.   Subjective Assessment - 2020-07-03 1006    Subjective Min pain in L low back. POTS symptoms bad on thanksgiving . I lifted a Christmas tree.  Pain is really different since starting PT, less intense and more localized?    Currently in Pain? Yes    Pain Score 3     Pain Location Back    Pain Orientation Left;Lower    Pain Descriptors / Indicators Aching    Pain Type Chronic pain    Pain Onset More than a month ago    Pain Frequency Intermittent    Aggravating Factors  lifting    Pain Relieving Factors changing positons, exercises                  Pilates Reformer used for LE/core strength, postural strength, lumbopelvic disassociation and core control.  Exercises included: Used jumpboard   Footwork 2 red 1 blue 1 yellow   Banded press out in parallel and slight turnout Single leg 2 Red 1 Blue   Added top knee extending   Added jumping 1 Red 1 yellow spring x 10 each leg x 2 sets Sidelying single leg footwork   1 Red 1 yellow  Parallel and then hip ER x 10 each   Then jumping 1 Red x 15   Reverse Abdominals with short box 1 red then 1 blue spring x 10 (modified)  Q-ped push with 1 UE added opposite leg lifted for bird dog  1 blue x 15 each side   Dead l   PT Education - 07-03-20 1517    Education Details continued Pilates post PT, HEP upgrade, core    Person(s) Educated Patient    Methods Explanation;Handout    Comprehension Verbalized understanding            PT Short Term Goals - 05/30/20 1234      PT SHORT TERM GOAL #1   Title Pt will be  I with iniital HEP for lumbopelvic stability    Status On-going      PT SHORT TERM GOAL #2   Title Pt will be able to complete and understand objective measures, results and projections.    Status Achieved      PT SHORT TERM GOAL #3   Title Pt will be able to report a reduction in pain in low back, L hip with normal ADLs to </= 2/10 most days of the week.    Baseline recent pain flare    Status On-going             PT Long Term Goals - 05/30/20 1235      PT LONG TERM GOAL #1   Title Patient will able to increase her FOTO score to 58% or more upon discharge    Status On-going      PT LONG TERM GOAL #2   Title Patient will be able to improve sleep quality, comfort/positioning to min disturbance overall.    Status On-going      PT LONG TERM GOAL #3   Title Patient will be able to sit for 45-60 min with min increase in back, hip pain for work activities    Status On-going      PT [place] #4   Title Patient will be able to safely lift, squat  up to 25 lbs from the floor for long term strength and function.    Status On-going      PT LONG TERM GOAL #5   Title Pt will be I with HEP as of last visit for core stability    Status On-going                 Plan - 06/12/20 1010    Clinical Impression Statement Began ,light jumping for cardio and continued stability challenges on Reformer.  She had no increased pain in her back.  Does need cues to stay engaged in core in sidelying, L hip was popping with extension exercises in sidelying.    PT Treatment/Interventions ADLs/Self Care Home Management;Cryotherapy;Patient/family education;Therapeutic exercise;Manual techniques;Aquatic Therapy;Moist Heat;Functional mobility training;Therapeutic activities;Ultrasound;Electrical Stimulation;Dry needling;Neuromuscular re-education;Balance training;Taping    PT Next Visit Plan cont Pilates, hip hinging , loading, stabilization. stretch hip? anti-rotation    PT Home Exercise Plan [email or web address]    Consulted and Agree with Plan of Care Patient           Patient will benefit from skilled therapeutic intervention in order to improve the following deficits and impairments:  Pain, Postural dysfunction, Impaired UE functional use, Impaired flexibility, Increased fascial restricitons, Hypermobility, Decreased strength, Decreased activity tolerance, Dizziness, Difficulty walking, Increased muscle spasms, Decreased mobility, Cardiopulmonary status limiting activity  Visit Diagnosis: Hypermobility syndrome  Chronic left-sided low back pain without sciatica  Muscle weakness (generalized)  POTS (postural orthostatic tachycardia syndrome)     Problem List Patient Active Problem List   Diagnosis Date Noted  . Orthostatic intolerance 05/28/2020  . Dysautonomia ([place]) 05/28/2020  . 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 POTS (postural orthostatic tachycardia syndrome) 01/17/2020  . Ehlers-Danlos syndrome type III 09/06/2019     Betty Lewis 06/12/2020, 3:19 PM  [place], [place], [address] Phone: [phone]   Fax:  [phone]  Name: Betty Lewis MRN: [id number] Date of Birth: Mar 16, 1986  [name], PT 06/12/20 3:22 PM Phone: [phone] Fax: [phone]

## 2020-06-14 ENCOUNTER — Other Ambulatory Visit: Payer: Self-pay | Admitting: Family Medicine

## 2020-06-14 ENCOUNTER — Other Ambulatory Visit: Payer: Self-pay

## 2020-06-14 ENCOUNTER — Ambulatory Visit: Payer: BC Managed Care – PPO | Attending: Sports Medicine | Admitting: Physical Therapy

## 2020-06-14 ENCOUNTER — Encounter: Payer: Self-pay | Admitting: Physical Therapy

## 2020-06-14 DIAGNOSIS — M545 Low back pain, unspecified: Secondary | ICD-10-CM | POA: Insufficient documentation

## 2020-06-14 DIAGNOSIS — G90A Postural orthostatic tachycardia syndrome (POTS): Secondary | ICD-10-CM

## 2020-06-14 DIAGNOSIS — M6281 Muscle weakness (generalized): Secondary | ICD-10-CM | POA: Diagnosis not present

## 2020-06-14 DIAGNOSIS — M357 Hypermobility syndrome: Secondary | ICD-10-CM

## 2020-06-14 DIAGNOSIS — I498 Other specified cardiac arrhythmias: Secondary | ICD-10-CM | POA: Diagnosis not present

## 2020-06-14 DIAGNOSIS — G8929 Other chronic pain: Secondary | ICD-10-CM | POA: Diagnosis not present

## 2020-06-14 MED ORDER — TRAMADOL HCL 50 MG PO TABS: ORAL_TABLET | ORAL | 1 refills | Status: DC

## 2020-06-14 NOTE — Therapy (Signed)
[place], [place], [address] Phone: [phone]   Fax:  [phone]  Physical Therapy Treatment  Patient Details  Name: Betty Lewis MRN: [id number] Date of Birth: 03-30-1986 Referring Provider (PT): Dr. Stefanie Libel    Encounter Date: 06/14/2020   PT End of Session - 06/14/20 1141    Visit Number 13    Number of Visits 16    Date for PT Re-Evaluation 06/25/20    Authorization Type BCBS    PT Start Time 1132    PT Stop Time 1230    PT Time Calculation (min) 58 min    Activity Tolerance Patient tolerated treatment well    Behavior During Therapy [facility] for tasks assessed/performed           Past Medical History:  Diagnosis Date  . Bulging lumbar disc    X 2  . PONV (postoperative nausea and vomiting)     Past Surgical History:  Procedure Laterality Date  . HIP ARTHROSCOPY Left 07/28/2019  . HYSTEROSCOPY N/A 11/06/2017   Procedure: HYSTEROSCOPY;  Surgeon: Janyth Pupa, DO;  Location: [facility];  Service: Gynecology;  Laterality: N/A;  . IUD REMOVAL N/A 11/06/2017   Procedure: INTRAUTERINE DEVICE (IUD) REMOVAL;  Surgeon: Janyth Pupa, DO;  Location: [facility];  Service: Gynecology;  Laterality: N/A;  . LEEP  2013  . TONSILLECTOMY  2005  . [place] EXTRACTION  2004    There were no vitals filed for this visit.   Subjective Assessment - 06/14/20 1138    Subjective I hurt my hip yesterday while lifting a pumpkin and a palette of water. Cramp in calf L.  Stabby on Rt side was 5/10.    Currently in Pain? Yes    Pain Score 5              [facility] Adult PT Treatment/Exercise - 06/14/20 0001      Lumbar Exercises: Stretches   Lower Trunk Rotation Limitations knees wide x 10 for  Hip mobility      Lumbar Exercises: Standing   Forward Lunge Limitations stagger on foam pad with shoulder extension    blue band x 15 each    Other Standing Lumbar Exercises Palloff press blue on foam pad x 15 added  rotation small ROM       Knee/Hip Exercises: Stretches   Active Hamstring Stretch Limitations blue band dynamic stretching  , circles    ITB Stretch Both;2 reps    Piriformis Stretch Both;2 reps    Piriformis Stretch Limitations add rotation knees together     Gastroc Stretch Both;3 reps (slant board)      Manual Therapy   Manual therapy comments IASTM calf and R glute med                     PT Short Term Goals - 05/30/20 1234      PT SHORT TERM GOAL #1   Title Pt will be I with iniital HEP for lumbopelvic stability    Status On-going      PT SHORT TERM GOAL #2   Title Pt will be able to complete and understand objective measures, results and projections.    Status Achieved      PT SHORT TERM GOAL #3   Title Pt will be able to report a reduction in pain in low back, L hip with normal ADLs to </= 2/10 most days of the week.    Baseline  recent pain flare    Status On-going             PT Long Term Goals - 05/30/20 1235      PT LONG TERM GOAL #1   Title Patient will able to increase her FOTO score to 58% or more upon discharge    Status On-going      PT LONG TERM GOAL #2   Title Patient will be able to improve sleep quality, comfort/positioning to min disturbance overall.    Status On-going      PT LONG TERM GOAL #3   Title Patient will be able to sit for 45-60 min with min increase in back, hip pain for work activities    Status On-going      PT [place] #4   Title Patient will be able to safely lift, squat up to 25 lbs from the floor for long term strength and function.    Status On-going      PT LONG TERM GOAL #5   Title Pt will be I with HEP as of last visit for core stability    Status On-going                 Plan - 06/14/20 1150    Clinical Impression Statement Patient with mild calf cramping and Rt glute pain after doing some moderate lifting.  Recommend she reduce depth of squat when lifting from the floor. Worked on reducing  spasm and then mobilizing/stabilizing with exercises.    PT Treatment/Interventions ADLs/Self Care Home Management;Cryotherapy;Patient/family education;Therapeutic exercise;Manual techniques;Aquatic Therapy;Moist Heat;Functional mobility training;Therapeutic activities;Ultrasound;Electrical Stimulation;Dry needling;Neuromuscular re-education;Balance training;Taping    PT Next Visit Plan cont Pilates, hip hinging , loading, stabilization. stretch hip? anti-rotation    PT Home Exercise Plan [email or web address]    Consulted and Agree with Plan of Care Patient           Patient will benefit from skilled therapeutic intervention in order to improve the following deficits and impairments:  Pain, Postural dysfunction, Impaired UE functional use, Impaired flexibility, Increased fascial restricitons, Hypermobility, Decreased strength, Decreased activity tolerance, Dizziness, Difficulty walking, Increased muscle spasms, Decreased mobility, Cardiopulmonary status limiting activity  Visit Diagnosis: Hypermobility syndrome  Chronic left-sided low back pain without sciatica  Muscle weakness (generalized)  POTS (postural orthostatic tachycardia syndrome)     Problem List Patient Active Problem List   Diagnosis Date Noted  . Orthostatic intolerance 05/28/2020  . Dysautonomia ([place]) 05/28/2020  . 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 POTS (postural orthostatic tachycardia syndrome) 01/17/2020  . Ehlers-Danlos syndrome type III 09/06/2019    Breck Hollinger 06/14/2020, 2:42 PM  [place], [place], [address] Phone: [phone]   Fax:  [phone]  Name: Betty Lewis MRN: [id number] Date of Birth: Mar 11, 1986  [name], PT 06/14/20 2:42 PM Phone: [phone] Fax: [phone]

## 2020-06-14 NOTE — Progress Notes (Signed)
Refilling for Dr. Oneida Alar with 1 additional refill.  Ask that she request refills from the pharmacy and they'll submit requests to [place].  Thanks!

## 2020-06-19 ENCOUNTER — Encounter: Payer: Self-pay | Admitting: Physical Therapy

## 2020-06-19 ENCOUNTER — Other Ambulatory Visit: Payer: Self-pay

## 2020-06-19 ENCOUNTER — Ambulatory Visit: Payer: BC Managed Care – PPO | Admitting: Physical Therapy

## 2020-06-19 DIAGNOSIS — G8929 Other chronic pain: Secondary | ICD-10-CM

## 2020-06-19 DIAGNOSIS — M545 Low back pain, unspecified: Secondary | ICD-10-CM

## 2020-06-19 DIAGNOSIS — M357 Hypermobility syndrome: Secondary | ICD-10-CM | POA: Diagnosis not present

## 2020-06-19 DIAGNOSIS — I498 Other specified cardiac arrhythmias: Secondary | ICD-10-CM | POA: Diagnosis not present

## 2020-06-19 DIAGNOSIS — M6281 Muscle weakness (generalized): Secondary | ICD-10-CM | POA: Diagnosis not present

## 2020-06-19 DIAGNOSIS — G90A Postural orthostatic tachycardia syndrome (POTS): Secondary | ICD-10-CM

## 2020-06-19 NOTE — Therapy (Signed)
[place], [place], [address] Phone: [phone]   Fax:  [phone]  Physical Therapy Treatment  Patient Details  Name: Betty Lewis MRN: [id number] Date of Birth: [birth year] Referring Provider (PT): Dr. Stefanie Libel    Encounter Date: 06/19/2020   PT End of Session - 06/19/20 1009    Visit Number 14    Number of Visits 16    Date for PT Re-Evaluation 06/25/20    Authorization Type BCBS    Authorization - Number of Visits 30    PT Start Time 1000    PT Stop Time 1045    PT Time Calculation (min) 45 min    Activity Tolerance Patient tolerated treatment well    Behavior During Therapy [facility] for tasks assessed/performed           Past Medical History:  Diagnosis Date  . Bulging lumbar disc    X 2  . PONV (postoperative nausea and vomiting)     Past Surgical History:  Procedure Laterality Date  . HIP ARTHROSCOPY Left 07/28/2019  . HYSTEROSCOPY N/A 11/06/2017   Procedure: HYSTEROSCOPY;  Surgeon: Janyth Pupa, DO;  Location: [facility];  Service: Gynecology;  Laterality: N/A;  . IUD REMOVAL N/A 11/06/2017   Procedure: INTRAUTERINE DEVICE (IUD) REMOVAL;  Surgeon: Janyth Pupa, DO;  Location: [facility];  Service: Gynecology;  Laterality: N/A;  . LEEP  2013  . TONSILLECTOMY  2005  . [place] EXTRACTION  2004    There were no vitals filed for this visit.   Subjective Assessment - 06/19/20 1007    Subjective I went bowling and was really sore in my L hip and then Rt hand.  No pain right now.  Has not called her insurance about coverage for Pilates group classes.    Currently in Pain? No/denies              [facility] Adult PT Treatment/Exercise - 06/19/20 0001      Pilates   Pilates Reformer see note            Pilates Reformer used for LE/core strength, postural strength, lumbopelvic disassociation and core control.  Exercises included:  Footwork 2 red 1 Yellow 1 Blue   toes on bar in  parallel, alternating with heel raise x 20    Bridging  With ball x 10  Bridge with march x 10 for stability   Supine Arm work with roll down bar: chest press, overhead with legs in table top  Added single leg hip extension.  Feet in straps  1 Red 1 yellow  Ball under pelvis  Arcs in hip ER, IR and parallel , squats  Jumping sidelying 2 rounds in parallel and 2 rounds in hip external rotation  1 red 1 Yellow , 30 sec intervals   Standing scooter  1 red stretching hip flexor   Lunge press back knee off x 10    added knee lift x 10 , no UEs       PT Short Term Goals - 05/30/20 1234      PT SHORT TERM GOAL #1   Title Pt will be I with iniital HEP for lumbopelvic stability    Status On-going      PT SHORT TERM GOAL #2   Title Pt will be able to complete and understand objective measures, results and projections.    Status Achieved      PT SHORT TERM GOAL #3   Title Pt  will be able to report a reduction in pain in low back, L hip with normal ADLs to </= 2/10 most days of the week.    Baseline recent pain flare    Status On-going             PT Long Term Goals - 05/30/20 1235      PT LONG TERM GOAL #1   Title Patient will able to increase her FOTO score to 58% or more upon discharge    Status On-going      PT LONG TERM GOAL #2   Title Patient will be able to improve sleep quality, comfort/positioning to min disturbance overall.    Status On-going      PT LONG TERM GOAL #3   Title Patient will be able to sit for 45-60 min with min increase in back, hip pain for work activities    Status On-going      PT [place] #4   Title Patient will be able to safely lift, squat up to 25 lbs from the floor for long term strength and function.    Status On-going      PT LONG TERM GOAL #5   Title Pt will be I with HEP as of last visit for core stability    Status On-going                 Plan - 06/19/20 1411    Clinical Impression Statement Patient with no  pain today. Requested Pilates for core and hip strengthening.  Closed chain and hip external rotation exercises caused discomfort in L post hip.  She shows a strong Environmental manager for Pilates in group class, working on getting insurance to help cover this as she has very limited funds available.  Progressing well and noticing less difficulty with ADLs and sleeping.    PT Treatment/Interventions ADLs/Self Care Home Management;Cryotherapy;Patient/family education;Therapeutic exercise;Manual techniques;Aquatic Therapy;Moist Heat;Functional mobility training;Therapeutic activities;Ultrasound;Electrical Stimulation;Dry needling;Neuromuscular re-education;Balance training;Taping    PT Next Visit Plan cont Pilates, hip hinging , loading, stabilization. stretch hip? anti-rotation    PT Home Exercise Plan [email or web address]    Consulted and Agree with Plan of Care Patient           Patient will benefit from skilled therapeutic intervention in order to improve the following deficits and impairments:  Pain, Postural dysfunction, Impaired UE functional use, Impaired flexibility, Increased fascial restricitons, Hypermobility, Decreased strength, Decreased activity tolerance, Dizziness, Difficulty walking, Increased muscle spasms, Decreased mobility, Cardiopulmonary status limiting activity  Visit Diagnosis: Hypermobility syndrome  Chronic left-sided low back pain without sciatica  Muscle weakness (generalized)  POTS (postural orthostatic tachycardia syndrome)     Problem List Patient Active Problem List   Diagnosis Date Noted  . Orthostatic intolerance 05/28/2020  . Dysautonomia ([place]) 05/28/2020  . 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 POTS (postural orthostatic tachycardia syndrome) 01/17/2020  . Ehlers-Danlos syndrome type III 09/06/2019    Betty Lewis 06/19/2020, 2:20 PM  [place], [place],  [address] Phone: [phone]   Fax:  [phone]  Name: Betty Lewis MRN: [id number] Date of Birth: [birth year]  [name], PT 06/19/20 2:22 PM Phone: [phone] Fax: [phone]

## 2020-06-21 ENCOUNTER — Ambulatory Visit: Payer: BC Managed Care – PPO | Admitting: Physical Therapy

## 2020-06-21 ENCOUNTER — Other Ambulatory Visit: Payer: Self-pay

## 2020-06-21 DIAGNOSIS — I498 Other specified cardiac arrhythmias: Secondary | ICD-10-CM | POA: Diagnosis not present

## 2020-06-21 DIAGNOSIS — G8929 Other chronic pain: Secondary | ICD-10-CM

## 2020-06-21 DIAGNOSIS — M357 Hypermobility syndrome: Secondary | ICD-10-CM | POA: Diagnosis not present

## 2020-06-21 DIAGNOSIS — G90A Postural orthostatic tachycardia syndrome (POTS): Secondary | ICD-10-CM

## 2020-06-21 DIAGNOSIS — M6281 Muscle weakness (generalized): Secondary | ICD-10-CM | POA: Diagnosis not present

## 2020-06-21 DIAGNOSIS — M545 Low back pain, unspecified: Secondary | ICD-10-CM | POA: Diagnosis not present

## 2020-06-21 NOTE — Therapy (Signed)
[place], [place], [address] Phone: [phone]   Fax:  [phone]  Physical Therapy Treatment  Patient Details  Name: SENAIDA CHILCOTE MRN: [id number] Date of Birth: [birth year] Referring Provider (PT): Dr. Stefanie Libel    Encounter Date: 06/21/2020   PT End of Session - 06/21/20 1245    Visit Number 15    Number of Visits 16    Date for PT Re-Evaluation 06/25/20    Authorization Type BCBS    PT Start Time 1045    PT Stop Time 1130    PT Time Calculation (min) 45 min    Activity Tolerance Patient tolerated treatment well    Behavior During Therapy [facility] for tasks assessed/performed           Past Medical History:  Diagnosis Date   Bulging lumbar disc    X 2   PONV (postoperative nausea and vomiting)     Past Surgical History:  Procedure Laterality Date   HIP ARTHROSCOPY Left 07/28/2019   HYSTEROSCOPY N/A 11/06/2017   Procedure: HYSTEROSCOPY;  Surgeon: Janyth Pupa, DO;  Location: [facility];  Service: Gynecology;  Laterality: N/A;   IUD REMOVAL N/A 11/06/2017   Procedure: INTRAUTERINE DEVICE (IUD) REMOVAL;  Surgeon: Janyth Pupa, DO;  Location: [facility];  Service: Gynecology;  Laterality: N/A;   LEEP  2013   TONSILLECTOMY  2005   WISDOM TOOTH EXTRACTION  2004    There were no vitals filed for this visit.       [place] Adult PT Treatment/Exercise - 06/21/20 0001      Lumbar Exercises: Aerobic   Stationary Bike L 2 hills 5 min      Lumbar Exercises: Standing   Functional Squats Limitations 16 lbs 2 x 15    Forward Lunge Limitations single leg hip hinge 8 LBS X 10 THEN no UEs    Other Standing Lumbar Exercises dead lift 2 sets x 15 , added row for 2nd set    Other Standing Lumbar Exercises 1/2 kneeling overhead press 5 lbs each x 10 , lift/chop 8 lbs x 10      Lumbar Exercises: Quadruped   Other Quadruped Lumbar Exercises tall kneeling hinge back and 8 lbs press x 10      Manual  Therapy   Soft tissue mobilization bilateral glutes, piriformis and SIJ                    PT Short Term Goals - 05/30/20 1234      PT SHORT TERM GOAL #1   Title Pt will be I with iniital HEP for lumbopelvic stability    Status On-going      PT SHORT TERM GOAL #2   Title Pt will be able to complete and understand objective measures, results and projections.    Status Achieved      PT SHORT TERM GOAL #3   Title Pt will be able to report a reduction in pain in low back, L hip with normal ADLs to </= 2/10 most days of the week.    Baseline recent pain flare    Status On-going             PT Long Term Goals - 05/30/20 1235      PT LONG TERM GOAL #1   Title Patient will able to increase her FOTO score to 58% or more upon discharge    Status On-going      PT LONG  TERM GOAL #2   Title Patient will be able to improve sleep quality, comfort/positioning to min disturbance overall.    Status On-going      PT LONG TERM GOAL #3   Title Patient will be able to sit for 45-60 min with min increase in back, hip pain for work activities    Status On-going      PT [place] #4   Title Patient will be able to safely lift, squat up to 25 lbs from the floor for long term strength and function.    Status On-going      PT LONG TERM GOAL #5   Title Pt will be I with HEP as of last visit for core stability    Status On-going                 Plan - 06/21/20 1243    Clinical Impression Statement Very min pain today in L hip. Worked on hip and core strengthening today with weights and in various positions. Consider renewal next week.    PT Treatment/Interventions ADLs/Self Care Home Management;Cryotherapy;Patient/family education;Therapeutic exercise;Manual techniques;Aquatic Therapy;Moist Heat;Functional mobility training;Therapeutic activities;Ultrasound;Electrical Stimulation;Dry needling;Neuromuscular re-education;Balance training;Taping    PT Next Visit Plan renewal?  1 x ? cont Pilates, hip hinging , loading, stabilization. stretch hip? anti-rotation    PT Home Exercise Plan [id number]    Consulted and Agree with Plan of Care Patient           Patient will benefit from skilled therapeutic intervention in order to improve the following deficits and impairments:  Pain,Postural dysfunction,Impaired UE functional use,Impaired flexibility,Increased fascial restricitons,Hypermobility,Decreased strength,Decreased activity tolerance,Dizziness,Difficulty walking,Increased muscle spasms,Decreased mobility,Cardiopulmonary status limiting activity  Visit Diagnosis: Hypermobility syndrome  Chronic left-sided low back pain without sciatica  Muscle weakness (generalized)  POTS (postural orthostatic tachycardia syndrome)     Problem List Patient Active Problem List   Diagnosis Date Noted   Orthostatic intolerance 05/28/2020   Dysautonomia ([place]) 05/28/2020   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 01/17/2020   POTS (postural orthostatic tachycardia syndrome) 01/17/2020   Ehlers-Danlos syndrome type III 09/06/2019    Aleeza [place] 06/21/2020, 12:48 PM  [facility] [address] [place], [place], [address] Phone: [phone]   Fax:  [phone]  Name: CRISTAN HOUT MRN: [id number] Date of Birth: January 23, 1986  [name], PT 06/21/20 12:48 PM Phone: [phone] Fax: [phone]

## 2020-06-26 ENCOUNTER — Ambulatory Visit: Payer: BC Managed Care – PPO | Admitting: Physical Therapy

## 2020-06-26 ENCOUNTER — Other Ambulatory Visit: Payer: Self-pay

## 2020-06-26 ENCOUNTER — Encounter: Payer: Self-pay | Admitting: Physical Therapy

## 2020-06-26 DIAGNOSIS — G8929 Other chronic pain: Secondary | ICD-10-CM | POA: Diagnosis not present

## 2020-06-26 DIAGNOSIS — M357 Hypermobility syndrome: Secondary | ICD-10-CM

## 2020-06-26 DIAGNOSIS — G90A Postural orthostatic tachycardia syndrome (POTS): Secondary | ICD-10-CM

## 2020-06-26 DIAGNOSIS — M6281 Muscle weakness (generalized): Secondary | ICD-10-CM | POA: Diagnosis not present

## 2020-06-26 DIAGNOSIS — I498 Other specified cardiac arrhythmias: Secondary | ICD-10-CM

## 2020-06-26 DIAGNOSIS — M545 Low back pain, unspecified: Secondary | ICD-10-CM | POA: Diagnosis not present

## 2020-06-26 NOTE — Therapy (Signed)
[place], [place], [address] Phone: [phone]   Fax:  [phone]  Physical Therapy Treatment/Renewal  Patient Details  Name: Betty Lewis MRN: [id number] Date of Birth: January 21, 1986 Referring Provider (PT): Dr. Stefanie Libel    Encounter Date: 06/26/2020   PT End of Session - 06/26/20 1008    Visit Number 16    Number of Visits 25   visit limit   Date for PT Re-Evaluation 08/21/20    Authorization Type BCBS    Authorization - Number of Visits 30    PT Start Time 1000    PT Stop Time 1045    PT Time Calculation (min) 45 min    Activity Tolerance Patient tolerated treatment well    Behavior During Therapy [facility] for tasks assessed/performed           Past Medical History:  Diagnosis Date   Bulging lumbar disc    X 2   PONV (postoperative nausea and vomiting)     Past Surgical History:  Procedure Laterality Date   HIP ARTHROSCOPY Left 07/28/2019   HYSTEROSCOPY N/A 11/06/2017   Procedure: HYSTEROSCOPY;  Surgeon: Janyth Pupa, DO;  Location: [facility];  Service: Gynecology;  Laterality: N/A;   IUD REMOVAL N/A 11/06/2017   Procedure: INTRAUTERINE DEVICE (IUD) REMOVAL;  Surgeon: Janyth Pupa, DO;  Location: [facility];  Service: Gynecology;  Laterality: N/A;   LEEP  2013   TONSILLECTOMY  2005   WISDOM TOOTH EXTRACTION  2004    There were no vitals filed for this visit.   Subjective Assessment - 06/26/20 1002    Subjective Had a moment the other day and noticed no pain in her hips.  None right now.    How long can you sit comfortably? sits for work, shift frequenty as more of a habit.  Does not have a standing desk.    How long can you stand comfortably? limited by POTS    How long can you walk comfortably? Walked a mile yesterday in about 20 min    Currently in Pain? No/denies              [facility] PT Assessment - 06/26/20 0001      Observation/Other Assessments   Focus on Therapeutic  Outcomes (FOTO)  49%      Strength   Right Hip Flexion 5/5    Right Hip ABduction 5/5    Left Hip Flexion 5/5    Left Hip ABduction 4+/5    Right Knee Flexion 5/5    Right Knee Extension 5/5    Left Knee Flexion 5/5    Left Knee Extension 5/5             [facility] Adult PT Treatment/Exercise - 06/26/20 0001      Lumbar Exercises: Stretches   Active Hamstring Stretch 1 rep;Left;Right    Hip Flexor Stretch 3 reps    Hip Flexor Stretch Limitations foam roller    ITB Stretch 1 rep;30 seconds    Piriformis Stretch Limitations rolling on foam x 30 sec each      Lumbar Exercises: Aerobic   Stationary Bike L3 hills 5 min      Lumbar Exercises: Supine   Basic Lumbar Stabilization Limitations on foam roller: UE chest press and overhead iift x 10 on foam roller    Straight Leg Raise 10 reps    Straight Leg Raises Limitations foam roller    Other Supine Lumbar Exercises goal post arms  on foam roller x 10                  PT Education - 06/26/20 1336    Education Details renewal, goals, progress , FOTO    Person(s) Educated Patient    Methods Explanation    Comprehension Verbalized understanding            PT Short Term Goals - 06/26/20 1016      PT SHORT TERM GOAL #1   Title Pt will be I with iniital HEP for lumbopelvic stability    Status New      PT SHORT TERM GOAL #2   Title Pt will be able to complete and understand objective measures, results and projections.    Status Achieved      PT SHORT TERM GOAL #3   Title Pt will be able to report a reduction in pain in low back, L hip with normal ADLs to </= 2/10 most days of the week.    Status New             PT Long Term Goals - 06/26/20 1012      PT LONG TERM GOAL #1   Title Patient will able to increase her FOTO score to 58% or more upon discharge    Status On-going      PT LONG TERM GOAL #2   Title Patient will be able to improve sleep quality, comfort/positioning to min disturbance overall.     Status Achieved      PT LONG TERM GOAL #3   Title Patient will be able to sit for 45-60 min with min increase in back, hip pain for work activities    Status On-going      PT [place] #4   Title Patient will be able to safely lift, squat up to 25 lbs from the floor for long term strength and function.    Status On-going      PT LONG TERM GOAL #5   Title Pt will be I with HEP as of last visit for core stability    Status On-going      PT LONG TERM GOAL #6   Title Pt will be able to walk for 30 min , 3 x per week with min fatigue and no increase in hip pain .    Baseline 20 min, 2 x per week , on flat ground    Time 8    Period Weeks    Status New                 Plan - 06/26/20 1016    Clinical Impression Statement Patient making excellent progress towards goals, reporting less pain and improved sleep over the past several weeks.  She has increased Rt hip abduction strength to 5/5.  She would like to be able to improve her endurance/exercise capacity and ability to lift heavy things for the floor.  HR up to 108 on recumbant bike. She will cont    PT Frequency 1x / week    PT Duration 8 weeks    PT Treatment/Interventions ADLs/Self Care Home Management;Cryotherapy;Patient/family education;Therapeutic exercise;Manual techniques;Aquatic Therapy;Moist Heat;Functional mobility training;Therapeutic activities;Ultrasound;Electrical Stimulation;Dry needling;Neuromuscular re-education;Balance training;Taping    PT Next Visit Plan 1 x ? cont Pilates, hip hinging , loading, stabilization. stretch hip? anti-rotation    PT Home Exercise Plan [id number]    Consulted and Agree with Plan of Care Patient  Patient will benefit from skilled therapeutic intervention in order to improve the following deficits and impairments:  Pain,Postural dysfunction,Impaired UE functional use,Impaired flexibility,Increased fascial restricitons,Hypermobility,Decreased strength,Decreased activity  tolerance,Dizziness,Difficulty walking,Increased muscle spasms,Decreased mobility,Cardiopulmonary status limiting activity  Visit Diagnosis: Hypermobility syndrome  Chronic left-sided low back pain without sciatica  Muscle weakness (generalized)  POTS (postural orthostatic tachycardia syndrome)     Problem List Patient Active Problem List   Diagnosis Date Noted   Orthostatic intolerance 05/28/2020   Dysautonomia ([place]) 05/28/2020   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 01/17/2020   POTS (postural orthostatic tachycardia syndrome) 01/17/2020   Ehlers-Danlos syndrome type III 09/06/2019    Kebrina Friend 06/26/2020, 1:38 PM  [place] [facility] [address] [place], [place], [address] Phone: [phone]   Fax:  [phone]  Name: Betty Lewis MRN: [id number] Date of Birth: 1985/09/06   [name], PT 06/26/20 1:38 PM Phone: [phone] Fax: [phone]

## 2020-06-28 ENCOUNTER — Ambulatory Visit: Payer: BC Managed Care – PPO | Admitting: Physical Therapy

## 2020-06-28 ENCOUNTER — Other Ambulatory Visit: Payer: Self-pay

## 2020-06-28 ENCOUNTER — Encounter: Payer: Self-pay | Admitting: Physical Therapy

## 2020-06-28 DIAGNOSIS — G8929 Other chronic pain: Secondary | ICD-10-CM

## 2020-06-28 DIAGNOSIS — M6281 Muscle weakness (generalized): Secondary | ICD-10-CM | POA: Diagnosis not present

## 2020-06-28 DIAGNOSIS — M545 Low back pain, unspecified: Secondary | ICD-10-CM | POA: Diagnosis not present

## 2020-06-28 DIAGNOSIS — G90A Postural orthostatic tachycardia syndrome (POTS): Secondary | ICD-10-CM

## 2020-06-28 DIAGNOSIS — I498 Other specified cardiac arrhythmias: Secondary | ICD-10-CM

## 2020-06-28 DIAGNOSIS — M357 Hypermobility syndrome: Secondary | ICD-10-CM

## 2020-06-28 NOTE — Therapy (Signed)
[place], [place], [address] Phone: [phone]   Fax:  [phone]  Physical Therapy Treatment  Patient Details  Name: Betty Lewis MRN: [id number] Date of Birth: 17-Jun-1986 Referring Provider (PT): Dr. Stefanie Libel    Encounter Date: 06/28/2020   PT End of Session - 06/28/20 1004    Visit Number 17    Number of Visits 25    Date for PT Re-Evaluation 08/21/20    Authorization Type BCBS    PT Start Time 0958    PT Stop Time 1045    PT Time Calculation (min) 47 min    Activity Tolerance Patient tolerated treatment well    Behavior During Therapy [facility] for tasks assessed/performed           Past Medical History:  Diagnosis Date  . Bulging lumbar disc    X 2  . PONV (postoperative nausea and vomiting)     Past Surgical History:  Procedure Laterality Date  . HIP ARTHROSCOPY Left 07/28/2019  . HYSTEROSCOPY N/A 11/06/2017   Procedure: HYSTEROSCOPY;  Surgeon: Janyth Pupa, DO;  Location: [facility];  Service: Gynecology;  Laterality: N/A;  . IUD REMOVAL N/A 11/06/2017   Procedure: INTRAUTERINE DEVICE (IUD) REMOVAL;  Surgeon: Janyth Pupa, DO;  Location: [facility];  Service: Gynecology;  Laterality: N/A;  . LEEP  2013  . TONSILLECTOMY  2005  . [place] EXTRACTION  2004    There were no vitals filed for this visit.   Subjective Assessment - 06/28/20 1001    Subjective Patient has mild pain in L back yesterday, none right now.    Currently in Pain? No/denies                    [facility] Adult PT Treatment/Exercise - 06/28/20 0001      Pilates   Pilates Reformer see note          Pilates Reformer used for LE/core strength, postural strength, lumbopelvic disassociation and core control.  Exercises included:  Footwork 2 Red 1 blue 1 Yellow  Double leg    Single leg  Bridging   Blue band press out x 5   Toes with clam   Press out   Hip circles x 5 each direction   Supine Arm /  Abs 1 Red 1 Yellow   Arcs in parallel x 5 added chest lift x 5, then repeat with arms in V   Feet in Straps   1 Red 1 Yellow Arcs in parallel and IR  x 10   side lying single leg 1 Red circles x 10 each direction     Reverse Abdominals 1 blue UE x 8 then LE x 8  Standing hip abduction, squat and then skater x 15 each   Adduction x blue x 15       PT Short Term Goals - 06/26/20 1016      PT SHORT TERM GOAL #1   Title Pt will be I with iniital HEP for lumbopelvic stability    Status New      PT SHORT TERM GOAL #2   Title Pt will be able to complete and understand objective measures, results and projections.    Status Achieved      PT SHORT TERM GOAL #3   Title Pt will be able to report a reduction in pain in low back, L hip with normal ADLs to </= 2/10 most days of the week.  Status New             PT Long Term Goals - 06/26/20 1012      PT LONG TERM GOAL #1   Title Patient will able to increase her FOTO score to 58% or more upon discharge    Status On-going      PT LONG TERM GOAL #2   Title Patient will be able to improve sleep quality, comfort/positioning to min disturbance overall.    Status Achieved      PT LONG TERM GOAL #3   Title Patient will be able to sit for 45-60 min with min increase in back, hip pain for work activities    Status On-going      PT [place] #4   Title Patient will be able to safely lift, squat up to 25 lbs from the floor for long term strength and function.    Status On-going      PT LONG TERM GOAL #5   Title Pt will be I with HEP as of last visit for core stability    Status On-going      PT LONG TERM GOAL #6   Title Pt will be able to walk for 30 min , 3 x per week with min fatigue and no increase in hip pain .    Baseline 20 min, 2 x per week , on flat ground    Time 8    Period Weeks    Status New                 Plan - 06/28/20 1123    Clinical Impression Statement Patient continues to benefit from  Pilates based PT for general hypermobility syndrome and bilateral hip, back pain . She did well today, able to perform more advanced standing exercises for stability and strength. Will cont 1 x per week for more maintenance of current condition.    PT Treatment/Interventions ADLs/Self Care Home Management;Cryotherapy;Patient/family education;Therapeutic exercise;Manual techniques;Aquatic Therapy;Moist Heat;Functional mobility training;Therapeutic activities;Ultrasound;Electrical Stimulation;Dry needling;Neuromuscular re-education;Balance training;Taping    PT Next Visit Plan 1 x ? cont Pilates, hip hinging , loading, stabilization. stretch hip? anti-rotation    PT Home Exercise Plan [id number]    Consulted and Agree with Plan of Care Patient           Patient will benefit from skilled therapeutic intervention in order to improve the following deficits and impairments:  Pain,Postural dysfunction,Impaired UE functional use,Impaired flexibility,Increased fascial restricitons,Hypermobility,Decreased strength,Decreased activity tolerance,Dizziness,Difficulty walking,Increased muscle spasms,Decreased mobility,Cardiopulmonary status limiting activity  Visit Diagnosis: Hypermobility syndrome  Chronic left-sided low back pain without sciatica  Muscle weakness (generalized)  POTS (postural orthostatic tachycardia syndrome)     Problem List Patient Active Problem List   Diagnosis Date Noted  . Orthostatic intolerance 05/28/2020  . Dysautonomia ([place]) 05/28/2020  . 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 POTS (postural orthostatic tachycardia syndrome) 01/17/2020  . Ehlers-Danlos syndrome type III 09/06/2019    Elberta Lachapelle 06/28/2020, 12:48 PM  [place], [place], [address] Phone: [phone]   Fax:  [phone]  Name: Betty Lewis MRN: [id number] Date of Birth: 04-Nov-1985

## 2020-07-05 DIAGNOSIS — Z20822 Contact with and (suspected) exposure to covid-19: Secondary | ICD-10-CM | POA: Diagnosis not present

## 2020-07-11 ENCOUNTER — Ambulatory Visit: Payer: BC Managed Care – PPO | Admitting: Physical Therapy

## 2020-07-11 ENCOUNTER — Other Ambulatory Visit: Payer: Self-pay

## 2020-07-11 ENCOUNTER — Encounter: Payer: Self-pay | Admitting: Physical Therapy

## 2020-07-11 DIAGNOSIS — G8929 Other chronic pain: Secondary | ICD-10-CM | POA: Diagnosis not present

## 2020-07-11 DIAGNOSIS — M357 Hypermobility syndrome: Secondary | ICD-10-CM | POA: Diagnosis not present

## 2020-07-11 DIAGNOSIS — I498 Other specified cardiac arrhythmias: Secondary | ICD-10-CM

## 2020-07-11 DIAGNOSIS — M6281 Muscle weakness (generalized): Secondary | ICD-10-CM | POA: Diagnosis not present

## 2020-07-11 DIAGNOSIS — M545 Low back pain, unspecified: Secondary | ICD-10-CM | POA: Diagnosis not present

## 2020-07-11 DIAGNOSIS — G90A Postural orthostatic tachycardia syndrome (POTS): Secondary | ICD-10-CM

## 2020-07-11 NOTE — Therapy (Signed)
[place], [place], [address] Phone: [phone]   Fax:  [phone]  Physical Therapy Treatment  Patient Details  Name: Betty Lewis MRN: [id number] Date of Birth: 1986-07-03 Referring Provider (PT): Dr. Stefanie Libel    Encounter Date: 07/11/2020   PT End of Session - 07/11/20 1459    Visit Number 18    Number of Visits 25    Date for PT Re-Evaluation 08/21/20    Authorization Type BCBS    PT Start Time 1445    PT Stop Time 1530    PT Time Calculation (min) 45 min    Activity Tolerance Patient tolerated treatment well    Behavior During Therapy [facility] for tasks assessed/performed           Past Medical History:  Diagnosis Date  . Bulging lumbar disc    X 2  . PONV (postoperative nausea and vomiting)     Past Surgical History:  Procedure Laterality Date  . HIP ARTHROSCOPY Left 07/28/2019  . HYSTEROSCOPY N/A 11/06/2017   Procedure: HYSTEROSCOPY;  Surgeon: Janyth Pupa, DO;  Location: [facility];  Service: Gynecology;  Laterality: N/A;  . IUD REMOVAL N/A 11/06/2017   Procedure: INTRAUTERINE DEVICE (IUD) REMOVAL;  Surgeon: Janyth Pupa, DO;  Location: [facility];  Service: Gynecology;  Laterality: N/A;  . LEEP  2013  . TONSILLECTOMY  2005  . [place] EXTRACTION  2004    There were no vitals filed for this visit.   Subjective Assessment - 07/11/20 1447    Subjective Got a body braid.  Pain is 4/10 in L hip.  Insurance does cover massage but not Pilates or Yoga.                 [place] Adult PT Treatment/Exercise - 07/11/20 0001      Pilates   Pilates Reformer see note      Lumbar Exercises: Aerobic   Recumbent Bike 5 min L2            Pilates Reformer used for LE/core strength, postural strength, lumbopelvic disassociation and core control.  Exercises included:  Scooter 1 Red stretching for anterior hip.  Lunge 2 x 10 each LE   Footwork 2 Red 1 Green   Used ball under pelvis  for challenge   Double leg parallel heel, toes  Turnout heel and toes , added heel raise x 10   Single leg 2 red 1 blue x 10 x 2 (parallel and turnout)    Bridging 3 springs x 10 added press out x 10   Feet in Straps  x 15: 1 Red and 1 yellow Arcs with Circles (parallel, turnout)  Squats x 15 turnout   Stag x 10   Kneeling plank x 10 red spring   Downstretch 1 Red 1 Yellow x 10       PT Short Term Goals - 07/11/20 1500      PT SHORT TERM GOAL #1   Title Pt will be I with iniital HEP for lumbopelvic stability    Status Achieved      PT SHORT TERM GOAL #2   Title Pt will be able to complete and understand objective measures, results and projections.    Status Achieved      PT SHORT TERM GOAL #3   Title Pt will be able to report a reduction in pain in low back, L hip with normal ADLs to </= 2/10 most days of the week.  Status Achieved             PT Long Term Goals - 06/26/20 1012      PT LONG TERM GOAL #1   Title Patient will able to increase her FOTO score to 58% or more upon discharge    Status On-going      PT LONG TERM GOAL #2   Title Patient will be able to improve sleep quality, comfort/positioning to min disturbance overall.    Status Achieved      PT LONG TERM GOAL #3   Title Patient will be able to sit for 45-60 min with min increase in back, hip pain for work activities    Status On-going      PT LONG TERM GOAL #4   Title Patient will be able to safely lift, squat up to 25 lbs from the floor for long term strength and function.    Status On-going      PT LONG TERM GOAL #5   Title Pt will be I with HEP as of last visit for core stability    Status On-going      PT LONG TERM GOAL #6   Title Pt will be able to walk for 30 min , 3 x per week with min fatigue and no increase in hip pain .    Baseline 20 min, 2 x per week , on flat ground    Time 8    Period Weeks    Status New                 Plan - 07/11/20 1452    Clinical Impression  Statement Patient may be able to attend community Pilates class as a way to extend her benefits. Able to complete Reformer exercises with min cues overall for alignment and technique.    PT Treatment/Interventions ADLs/Self Care Home Management;Cryotherapy;Patient/family education;Therapeutic exercise;Manual techniques;Aquatic Therapy;Moist Heat;Functional mobility training;Therapeutic activities;Ultrasound;Electrical Stimulation;Dry needling;Neuromuscular re-education;Balance training;Taping    PT Next Visit Plan cont Pilates, hip hinging , loading, stabilization. stretch hip? anti-rotation    PT Home Exercise Plan [id number]    Consulted and Agree with Plan of Care Patient           Patient will benefit from skilled therapeutic intervention in order to improve the following deficits and impairments:  Pain,Postural dysfunction,Impaired UE functional use,Impaired flexibility,Increased fascial restricitons,Hypermobility,Decreased strength,Decreased activity tolerance,Dizziness,Difficulty walking,Increased muscle spasms,Decreased mobility,Cardiopulmonary status limiting activity  Visit Diagnosis: Hypermobility syndrome  Chronic left-sided low back pain without sciatica  Muscle weakness (generalized)  POTS (postural orthostatic tachycardia syndrome)     Problem List Patient Active Problem List   Diagnosis Date Noted  . Orthostatic intolerance 05/28/2020  . Dysautonomia ([facility]) 05/28/2020  . 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 POTS (postural orthostatic tachycardia syndrome) 01/17/2020  . Ehlers-Danlos syndrome type III 09/06/2019    Gabriell Daigneault 07/11/2020, 5:00 PM  [facility] [address] [place], [place], [address] Phone: [phone]   Fax:  [phone]  Name: Betty Lewis MRN: [id number] Date of Birth: 18-Mar-1986  Karie Mainland, PT 07/11/20 5:01 PM Phone: [phone] Fax: [phone]

## 2020-07-24 ENCOUNTER — Ambulatory Visit: Payer: BC Managed Care – PPO | Attending: Sports Medicine | Admitting: Physical Therapy

## 2020-07-24 ENCOUNTER — Other Ambulatory Visit: Payer: Self-pay

## 2020-07-24 DIAGNOSIS — I498 Other specified cardiac arrhythmias: Secondary | ICD-10-CM | POA: Diagnosis not present

## 2020-07-24 DIAGNOSIS — M545 Low back pain, unspecified: Secondary | ICD-10-CM

## 2020-07-24 DIAGNOSIS — M357 Hypermobility syndrome: Secondary | ICD-10-CM | POA: Diagnosis not present

## 2020-07-24 DIAGNOSIS — G8929 Other chronic pain: Secondary | ICD-10-CM

## 2020-07-24 DIAGNOSIS — G90A Postural orthostatic tachycardia syndrome (POTS): Secondary | ICD-10-CM

## 2020-07-24 DIAGNOSIS — M6281 Muscle weakness (generalized): Secondary | ICD-10-CM

## 2020-07-24 NOTE — Therapy (Addendum)
[place], [place], [address] Phone: [phone]   Fax:  [phone]  Physical Therapy Treatment/Discharge  Patient Details  Name: Betty Lewis MRN: [id number] Date of Birth: 1986/01/15 Referring Provider (PT): Dr. Stefanie Libel    Encounter Date: 07/24/2020   PT End of Session - 07/24/20 1012    Visit Number 19    Number of Visits 25    Date for PT Re-Evaluation 08/21/20    Authorization Type BCBS    PT Start Time 1008    PT Stop Time 1049    PT Time Calculation (min) 41 min    Activity Tolerance Patient tolerated treatment well    Behavior During Therapy [facility] for tasks assessed/performed           Past Medical History:  Diagnosis Date  . Bulging lumbar disc    X 2  . PONV (postoperative nausea and vomiting)     Past Surgical History:  Procedure Laterality Date  . HIP ARTHROSCOPY Left 07/28/2019  . HYSTEROSCOPY N/A 11/06/2017   Procedure: HYSTEROSCOPY;  Surgeon: Janyth Pupa, DO;  Location: [facility];  Service: Gynecology;  Laterality: N/A;  . IUD REMOVAL N/A 11/06/2017   Procedure: INTRAUTERINE DEVICE (IUD) REMOVAL;  Surgeon: Janyth Pupa, DO;  Location: [facility];  Service: Gynecology;  Laterality: N/A;  . LEEP  2013  . TONSILLECTOMY  2005  . [place] EXTRACTION  2004    There were no vitals filed for this visit.   Subjective Assessment - 07/24/20 1011    Subjective I had pain the other day and it was pretty bad, low back/Hip.  Trip got cancelled.  No pain right now.              [place] Adult PT Treatment/Exercise - 07/24/20 0001      Lumbar Exercises: Standing   Lifting Limitations Palloff press single leg blue band x 10 and then double leg with rotation    Other Standing Lumbar Exercises standing lateral band walks 4 x 15 feet    Other Standing Lumbar Exercises dead lift/row x 15 with 8 lbs DB      Lumbar Exercises: Supine   Heel Slides Limitations knee extension low back on  ball    Bent Knee Raise Limitations 90/90 toe taps x 10    Bridge with [employer] 10 reps    Bridge with March 10 reps    Single Leg Bridge 10 reps    Straight Leg Raise 10 reps    Straight Leg Raises Limitations added abd small range      Lumbar Exercises: Quadruped   Opposite Arm/Leg Raise Right arm/Left leg;Left arm/Right leg;10 reps    Other Quadruped Lumbar Exercises tall kneeling: hip hinge, overhead press (5-8 lbs) , lift/chop (8 lbs ) all x 10 -15 reps    Other Quadruped Lumbar Exercises added "east-west" arm and leg                    PT Short Term Goals - 07/11/20 1500      PT SHORT TERM GOAL #1   Title Pt will be I with iniital HEP for lumbopelvic stability    Status Achieved      PT SHORT TERM GOAL #2   Title Pt will be able to complete and understand objective measures, results and projections.    Status Achieved      PT SHORT TERM GOAL #3   Title Pt will be  able to report a reduction in pain in low back, L hip with normal ADLs to </= 2/10 most days of the week.    Status Achieved             PT Long Term Goals - 06/26/20 1012      PT LONG TERM GOAL #1   Title Patient will able to increase her FOTO score to 58% or more upon discharge    Status On-going      PT LONG TERM GOAL #2   Title Patient will be able to improve sleep quality, comfort/positioning to min disturbance overall.    Status Achieved      PT LONG TERM GOAL #3   Title Patient will be able to sit for 45-60 min with min increase in back, hip pain for work activities    Status On-going      PT [place] #4   Title Patient will be able to safely lift, squat up to 25 lbs from the floor for long term strength and function.    Status On-going      PT LONG TERM GOAL #5   Title Pt will be I with HEP as of last visit for core stability    Status On-going      PT LONG TERM GOAL #6   Title Pt will be able to walk for 30 min , 3 x per week with min fatigue and no increase in hip  pain .    Baseline 20 min, 2 x per week , on flat ground    Time 8    Period Weeks    Status New                 Plan - 07/24/20 1039    Clinical Impression Statement Worked on strengthening for core and hips, conditioning. No increased pain, needed min cue for maintaining form with hips, pelvis. Needed seated rest breaks between standing exercises.    PT Treatment/Interventions ADLs/Self Care Home Management;Cryotherapy;Patient/family education;Therapeutic exercise;Manual techniques;Aquatic Therapy;Moist Heat;Functional mobility training;Therapeutic activities;Ultrasound;Electrical Stimulation;Dry needling;Neuromuscular re-education;Balance training;Taping    PT Next Visit Plan cont Pilates, hip hinging , loading, stabilization. stretch hip? anti-rotation    PT Home Exercise Plan [id number]    Consulted and Agree with Plan of Care Patient           Patient will benefit from skilled therapeutic intervention in order to improve the following deficits and impairments:  Pain,Postural dysfunction,Impaired UE functional use,Impaired flexibility,Increased fascial restricitons,Hypermobility,Decreased strength,Decreased activity tolerance,Dizziness,Difficulty walking,Increased muscle spasms,Decreased mobility,Cardiopulmonary status limiting activity  Visit Diagnosis: Hypermobility syndrome  Chronic left-sided low back pain without sciatica  Muscle weakness (generalized)  POTS (postural orthostatic tachycardia syndrome)     Problem List Patient Active Problem List   Diagnosis Date Noted  . Orthostatic intolerance 05/28/2020  . Dysautonomia ([place]) 05/28/2020  . 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 POTS (postural orthostatic tachycardia syndrome) 01/17/2020  . Ehlers-Danlos syndrome type III 09/06/2019    PAA,JENNIFER 07/24/2020, 11:00 [place], [place], [address] Phone:  [phone]   Fax:  [phone]  Name: Betty Lewis MRN: [id number] Date of Birth: [birth year]  [name], PT 07/24/20 11:00 AM Phone: [phone] Fax: [phone]   PHYSICAL THERAPY DISCHARGE SUMMARY  Visits from Start of Care: 19  Current functional level related to goals / functional outcomes: See most recent info    Remaining deficits: See visit  Education / Equipment: HEP, Pilates, joint stability  Plan: Patient agrees to discharge.  Patient goals were partially met. Patient is being discharged due to not returning since the last visit.  ?????    [name], PT 09/20/20 11:18 AM Phone: [phone] Fax: [phone]

## 2020-07-31 ENCOUNTER — Ambulatory Visit: Payer: BC Managed Care – PPO | Admitting: Physical Therapy

## 2020-08-06 DIAGNOSIS — D2262 Melanocytic nevi of left upper limb, including shoulder: Secondary | ICD-10-CM | POA: Diagnosis not present

## 2020-08-06 DIAGNOSIS — D2261 Melanocytic nevi of right upper limb, including shoulder: Secondary | ICD-10-CM | POA: Diagnosis not present

## 2020-08-06 DIAGNOSIS — D225 Melanocytic nevi of trunk: Secondary | ICD-10-CM | POA: Diagnosis not present

## 2020-08-06 DIAGNOSIS — L700 Acne vulgaris: Secondary | ICD-10-CM | POA: Diagnosis not present

## 2020-08-07 ENCOUNTER — Other Ambulatory Visit: Payer: Self-pay | Admitting: Sports Medicine

## 2020-08-07 MED ORDER — TRAMADOL HCL 50 MG PO TABS: ORAL_TABLET | ORAL | 3 refills | Status: DC

## 2020-08-20 ENCOUNTER — Encounter: Payer: Self-pay | Admitting: Physical Therapy

## 2020-09-06 ENCOUNTER — Telehealth (INDEPENDENT_AMBULATORY_CARE_PROVIDER_SITE_OTHER): Payer: BC Managed Care – PPO | Admitting: Internal Medicine

## 2020-09-06 ENCOUNTER — Other Ambulatory Visit: Payer: Self-pay

## 2020-09-06 VITALS — HR 80 | Ht 66.0 in | Wt 150.0 lb

## 2020-09-06 DIAGNOSIS — I951 Orthostatic hypotension: Secondary | ICD-10-CM

## 2020-09-06 DIAGNOSIS — G901 Familial dysautonomia [Riley-Day]: Secondary | ICD-10-CM | POA: Diagnosis not present

## 2020-09-06 NOTE — Patient Instructions (Signed)

## 2020-09-06 NOTE — Progress Notes (Signed)
Electrophysiology TeleHealth Note   Due to national recommendations of social distancing due to COVID 19, an audio/video telehealth visit is felt to be most appropriate for this patient at this time.  See MyChart message from today for the patient's consent to telehealth for [facility].   Date:  09/06/2020   ID:  Betty Lewis, DOB [birth year], MRN [id number]  Location: patient's home  Provider location: [address] [place]  Evaluation Performed: Follow-up visit  PCP:  Glenis Smoker, MD  Cardiologist:     Electrophysiologist:  SK   Chief Complaint: POTS  History of Present Illness:    Betty Lewis is a 35 y.o. female who presents via audio/video conferencing for a telehealth visit today.  Since last being seen in our clinic for joint laxity/EDS with POTS  And recurring hip pain w/sleep related   the patient reports *sleeping better.   Doing Pilates under direction of Dr Natalia Leatherwood, with some improvement  More night time alerts for fast HR>. 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    Past Medical History:  Diagnosis Date  . Bulging lumbar disc    X 2  . PONV (postoperative nausea and vomiting)     Past Surgical History:  Procedure Laterality Date  . HIP ARTHROSCOPY Left 07/28/2019  . HYSTEROSCOPY N/A 11/06/2017   Procedure: HYSTEROSCOPY;  Surgeon: Janyth Pupa, DO;  Location: [facility];  Service: Gynecology;  Laterality: N/A;  . IUD REMOVAL N/A 11/06/2017   Procedure: INTRAUTERINE DEVICE (IUD) REMOVAL;  Surgeon: Janyth Pupa, DO;  Location: [facility];  Service: Gynecology;  Laterality: N/A;  . LEEP  2013  . TONSILLECTOMY  2005  . WISDOM TOOTH EXTRACTION  2004    Current  Outpatient Medications  Medication Sig Dispense Refill  . levonorgestrel-ethinyl estradiol (ALESSE) 0.1-20 MG-MCG tablet Take 1 tablet by mouth daily.     . ondansetron (ZOFRAN) 4 MG tablet Take 1 tablet (4 mg total) by mouth every 6 (six) hours as needed for nausea or vomiting. 60 tablet 0  . pindolol (VISKEN) 5 MG tablet Take 1 tablet (5 mg total) by mouth 2 (two) times daily. 180 tablet 3  . traMADol (ULTRAM) 50 MG tablet Take 1-2 tabs every 12 hours as needed for pain 60 tablet 3   No current facility-administered medications for this visit.    Allergies:   Codeine   Social History:  The patient  reports that she has never smoked. She has never used smokeless tobacco. She reports previous alcohol use. She reports that she does not use drugs.   Family History:  The patient's   family history includes CAD in her mother; Diabetes in her mother; Heart disease in her mother.   ROS:  Please see the history of present illness.   All other systems are personally reviewed and negative.    Exam:    Vital Signs:  Pulse 80   Ht 5\' 6"  (1.676 m)   Wt 150 lb (68 kg)   SpO2 97%   BMI 24.21 kg/m     Well appearing, alert and conversant, regular work of breathing,  good skin color Eyes- anicteric, neuro- grossly intact, skin-  no apparent rash or lesions or cyanosis, mouth- oral mucosa is pink   Labs/Other Tests and Data Reviewed:    Recent Labs: No results found for requested labs within last 8760 hours.   Wt Readings from Last 3 Encounters:  09/06/20 150 lb (68 kg)  05/31/20 145 lb (65.8 kg)  04/05/20 145 lb (65.8 kg)     Other studies personally reviewed: Additional studies/ records that were reviewed today include    ASSESSMENT & PLAN:     Dysautonomia with criteria mostly for orthostatic intolerance  Joint laxity syndrome  GI issues including nausea and constipation  Hip pain impairing sleep  Stress  Night time alert  Will follow the night HR>> 100, increase HR  lasts minutes   Not clearly pain or dreams  occruing more often  Not awakening and for now will follow   GI issues ongoing eval and treatment strategies--[facility]  arthralgias suggested followup with PCP-- ? Related to [place] 19 screen The patient denies symptoms of COVID 19 at this time.  The importance of social distancing was discussed today.  Follow-up:  10m    Current medicines are reviewed at length with the patient today.   The patient does not have concerns regarding her medicines.  The following changes were made today:  none  Labs/ tests ordered today include:   No orders of the defined types were placed in this encounter.   Future tests ( post COVID )     Patient Risk:  after full review of this patients clinical status, I feel that they are at moderate  risk at this time.  Today, I have spent 23 minutes with the patient with telehealth technology discussing the above.  Signed, Virl Axe, MD  09/06/2020 3:56 PM     [place] [address] [place] [address] [phone] (office) [phone] (fax)

## 2020-10-25 ENCOUNTER — Other Ambulatory Visit: Payer: Self-pay

## 2020-10-25 DIAGNOSIS — R635 Abnormal weight gain: Secondary | ICD-10-CM

## 2020-11-13 ENCOUNTER — Ambulatory Visit: Payer: BC Managed Care – PPO | Admitting: Sports Medicine

## 2020-11-13 ENCOUNTER — Other Ambulatory Visit: Payer: Self-pay

## 2020-11-13 DIAGNOSIS — L509 Urticaria, unspecified: Secondary | ICD-10-CM

## 2020-11-13 DIAGNOSIS — Q7962 Hypermobile Ehlers-Danlos syndrome: Secondary | ICD-10-CM | POA: Diagnosis not present

## 2020-11-13 MED ORDER — MONTELUKAST SODIUM 10 MG PO TABS
10.0000 mg | ORAL_TABLET | Freq: Every day | ORAL | 3 refills | Status: DC
Start: 2020-11-13 — End: 2021-03-28

## 2020-11-13 MED ORDER — TRAMADOL HCL 50 MG PO TABS: ORAL_TABLET | ORAL | 3 refills | Status: DC

## 2020-11-13 MED ORDER — CIMETIDINE 400 MG PO TABS: 400.0000 mg | ORAL_TABLET | Freq: Two times a day (BID) | ORAL | 3 refills | Status: DC

## 2020-11-13 NOTE — Assessment & Plan Note (Signed)
Generally stable More headaches and does a lot of computer work I suggested neck exercises If persistent may need to use soft collar for rest

## 2020-11-13 NOTE — Patient Instructions (Signed)
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 Each day 10 minute segments 3 mins of which is intense High intensity interval therapy  Do this daily if you can 2 to 3 lbs per month is success  Diet with emphasis on fiber 5 fruit servings per day High fiber vegetals 4 to 5 servings per day Stop protein supplements Get one serving of [place], fish, etc

## 2020-11-13 NOTE — Progress Notes (Signed)
CC: EDS with skin hypersensitivity  Having hives and red streaks Has taken zyrtec for years Recently skin is more sensitive Itches and feels hot  Weight gain Feels that she cannot keep weight off Lost 34 lbs but now gaining weight back Trys to follow weight watchers and avoids carbs Exercise more limited 2/2 joints  ROS Gets depressed with weight issues POTS fairly stable   PE Tearful young lady in NAD BP 110/72   Ht 5\' 6"  (1.676 m)   Wt 155 lb (70.3 kg)   BMI 25.02 kg/m   Skin: shows dermatographia Easily blotches  Hypermobility of joints as noted No pain with normal ROM

## 2020-11-13 NOTE — Assessment & Plan Note (Signed)
Continue zyrtec in mornings Add tagamet 400 bid Add singulair 10 at hs  See if triple therapy will reverse this flare Reck 1 month

## 2021-01-18 ENCOUNTER — Other Ambulatory Visit: Payer: Self-pay

## 2021-01-18 DIAGNOSIS — R635 Abnormal weight gain: Secondary | ICD-10-CM | POA: Diagnosis not present

## 2021-01-19 LAB — T4: T4, Total: 10.7 ug/dL (ref 4.5–12.0)

## 2021-01-19 LAB — TSH: TSH: 1.97 u[IU]/mL (ref 0.450–4.500)

## 2021-01-30 DIAGNOSIS — Z0000 Encounter for general adult medical examination without abnormal findings: Secondary | ICD-10-CM | POA: Diagnosis not present

## 2021-03-28 ENCOUNTER — Other Ambulatory Visit: Payer: Self-pay

## 2021-03-28 MED ORDER — MONTELUKAST SODIUM 10 MG PO TABS: 10.0000 mg | ORAL_TABLET | Freq: Every day | ORAL | 3 refills | Status: DC

## 2021-03-28 MED ORDER — TRAMADOL HCL 50 MG PO TABS: ORAL_TABLET | ORAL | 3 refills | Status: DC

## 2021-04-10 ENCOUNTER — Other Ambulatory Visit: Payer: Self-pay

## 2021-04-10 ENCOUNTER — Ambulatory Visit (INDEPENDENT_AMBULATORY_CARE_PROVIDER_SITE_OTHER): Payer: 59 | Admitting: Endocrinology

## 2021-04-10 DIAGNOSIS — R635 Abnormal weight gain: Secondary | ICD-10-CM | POA: Diagnosis not present

## 2021-04-10 MED ORDER — DEXAMETHASONE 1 MG PO TABS: 1.0000 mg | ORAL_TABLET | ORAL | 0 refills | Status: DC

## 2021-04-10 NOTE — Progress Notes (Signed)
Subjective:    Patient ID: Betty Lewis, female    DOB: Nov 20, 1985, 35 y.o.   MRN: [id number]  HPI Pt is referred by Dr Lindell Noe.  Pt reports moderate weight gain, 30 lbs x 20 months.  She says her diet and exercise do not explain this.   She has not recently taken any steroids.  She has no h/o cancer, pituitary disorder, skin ulcers, cataracts, PUD, HTN, osteoporosis, DM, or adrenal disorder.  She has never had pituitary or adrenal imaging. She had an MRI for a research study in approx 2019.  She is unaware of any abnormality.  She has intermitt HA, chronic arthralgias, and easy bruising. Past Medical History:  Diagnosis Date   Bulging lumbar disc    X 2   PONV (postoperative nausea and vomiting)     Past Surgical History:  Procedure Laterality Date   HIP ARTHROSCOPY Left 07/28/2019   HYSTEROSCOPY N/A 11/06/2017   Procedure: HYSTEROSCOPY;  Surgeon: Janyth Pupa, DO;  Location: [facility];  Service: Gynecology;  Laterality: N/A;   IUD REMOVAL N/A 11/06/2017   Procedure: INTRAUTERINE DEVICE (IUD) REMOVAL;  Surgeon: Janyth Pupa, DO;  Location: [facility];  Service: Gynecology;  Laterality: N/A;   LEEP  2013   TONSILLECTOMY  2005   WISDOM TOOTH EXTRACTION  2004    Social History   Socioeconomic History   Marital status: Single    Spouse name: Not on file   Number of children: Not on file   Years of education: Not on file   Highest education level: Not on file  Occupational History   Not on file  Tobacco Use   Smoking status: Never   Smokeless tobacco: Never  Vaping Use   Vaping Use: Never used  Substance and Sexual Activity   Alcohol use: Not Currently   Drug use: Never   Sexual activity: Yes  Other Topics Concern   Not on file  Social History Narrative   Works at [employer] Strain: Not on file  Food Insecurity: Not on file  Transportation Needs: Not on file  Physical Activity: Not on file  Stress: Not on file   Social Connections: Not on file  Intimate Partner Violence: Not on file    Current Outpatient Medications on File Prior to Visit  Medication Sig Dispense Refill   cimetidine (TAGAMET) 400 MG tablet Take 1 tablet (400 mg total) by mouth 2 (two) times daily. 60 tablet 3   levonorgestrel-ethinyl estradiol (ALESSE) 0.1-20 MG-MCG tablet Take 1 tablet by mouth daily.      montelukast (SINGULAIR) 10 MG tablet Take 1 tablet (10 mg total) by mouth at bedtime. 30 tablet 3   ondansetron (ZOFRAN) 4 MG tablet Take 1 tablet (4 mg total) by mouth every 6 (six) hours as needed for nausea or vomiting. 60 tablet 0   pindolol (VISKEN) 5 MG tablet Take 1 tablet (5 mg total) by mouth 2 (two) times daily. 180 tablet 3   traMADol (ULTRAM) 50 MG tablet Take 1-2 tabs every 12 hours as needed for pain 60 tablet 3   No current facility-administered medications on file prior to visit.    Allergies  Allergen Reactions   Codeine     MAKES HER DIZZY AND DISORIENTED    Family History  Problem Relation Age of Onset   Heart disease Mother    CAD Mother    Diabetes Mother     BP 110/80 (BP Location:  Right Arm, Patient Position: Sitting, Cuff Size: Normal)   Pulse 80   Ht 5\' 6"  (1.676 m)   Wt 168 lb 12.8 oz (76.6 kg)   SpO2 98%   BMI 27.25 kg/m     Review of Systems denies hirsutism, depression, and rash on the abdomen.     Objective:   Physical Exam VS: see vs page GEN: no distress HEAD: head: no deformity eyes: no periorbital swelling, no proptosis external nose and ears are normal NECK: supple, thyroid is not enlarged CHEST WALL: no deformity LUNGS: clear to auscultation CV: reg rate and rhythm, no murmur.  MUSCULOSKELETAL: gait is normal and steady EXTEMITIES: no deformity.  no leg edema NEURO:  readily moves all 4's.  sensation is intact to touch on all 4's SKIN:  Normal texture and temperature.  No rash or suspicious lesion is visible.  No striae.  NODES:  None palpable at the neck.    PSYCH: alert, well-oriented.  Does not appear anxious nor depressed.   Lab Results  Component Value Date   TSH 1.970 01/18/2021   T4TOTAL 10.7 01/18/2021   I have reviewed outside records, and summarized: Pt was noted to have weight gain, and referred here.  Multiple symptoms were addressed     Assessment & Plan:  Weight gain, new to me, uncertain etiology and prognosis.    Patient Instructions  Let's check a 24HR urine test, for cortisol. You should do a "dexamethasone suppression test."  For this, you would take dexamethasone 1 mg at 10 pm (I have sent a prescription to your pharmacy), then come in for a "cortisol" blood test the next morning before 9 am.  You do not need to be fasting for this test.

## 2021-04-10 NOTE — Patient Instructions (Signed)
Let's check a 24HR urine test, for cortisol. You should do a "dexamethasone suppression test."  For this, you would take dexamethasone 1 mg at 10 pm (I have sent a prescription to your pharmacy), then come in for a "cortisol" blood test the next morning before 9 am.  You do not need to be fasting for this test.

## 2021-04-17 ENCOUNTER — Other Ambulatory Visit: Payer: Self-pay

## 2021-04-17 ENCOUNTER — Other Ambulatory Visit (INDEPENDENT_AMBULATORY_CARE_PROVIDER_SITE_OTHER): Payer: 59

## 2021-04-17 DIAGNOSIS — R635 Abnormal weight gain: Secondary | ICD-10-CM | POA: Diagnosis not present

## 2021-04-17 LAB — CORTISOL: Cortisol, Plasma: 1.1 ug/dL

## 2021-04-17 LAB — T4, FREE: Free T4: 0.56 ng/dL — ABNORMAL LOW (ref 0.60–1.60)

## 2021-04-17 LAB — TSH: TSH: 0.81 u[IU]/mL (ref 0.35–5.50)

## 2021-04-17 LAB — T3, FREE: T3, Free: 2.7 pg/mL (ref 2.3–4.2)

## 2021-04-18 ENCOUNTER — Encounter: Payer: Self-pay | Admitting: Endocrinology

## 2021-04-23 ENCOUNTER — Other Ambulatory Visit: Payer: Self-pay | Admitting: Endocrinology

## 2021-04-23 DIAGNOSIS — R946 Abnormal results of thyroid function studies: Secondary | ICD-10-CM | POA: Insufficient documentation

## 2021-04-23 LAB — CORTISOL, URINE, 24 HOUR
24 Hour urine volume (VMAHVA): 2900 mL
CREATININE, URINE: 1.67 g/(24.h) (ref 0.50–2.15)
Cortisol (Ur), Free: 17.6 mcg/24 h (ref 4.0–50.0)

## 2021-05-15 ENCOUNTER — Other Ambulatory Visit (INDEPENDENT_AMBULATORY_CARE_PROVIDER_SITE_OTHER): Payer: 59

## 2021-05-15 ENCOUNTER — Other Ambulatory Visit: Payer: Self-pay

## 2021-05-15 DIAGNOSIS — R946 Abnormal results of thyroid function studies: Secondary | ICD-10-CM

## 2021-05-15 LAB — BASIC METABOLIC PANEL
BUN: 11 mg/dL (ref 6–23)
CO2: 24 mEq/L (ref 19–32)
Calcium: 9 mg/dL (ref 8.4–10.5)
Chloride: 105 mEq/L (ref 96–112)
Creatinine, Ser: 0.71 mg/dL (ref 0.40–1.20)
GFR: 109.56 mL/min (ref >60.00)
Glucose, Bld: 92 mg/dL (ref 70–99)
Potassium: 3.9 mEq/L (ref 3.5–5.1)
Sodium: 138 mEq/L (ref 135–145)

## 2021-05-15 LAB — CORTISOL: Cortisol, Plasma: 20.6 ug/dL

## 2021-05-15 LAB — TSH: TSH: 3.48 u[IU]/mL (ref 0.35–5.50)

## 2021-05-15 LAB — T4, FREE: Free T4: 0.83 ng/dL (ref 0.60–1.60)

## 2021-05-20 ENCOUNTER — Encounter: Payer: Self-pay | Admitting: Endocrinology

## 2021-05-20 LAB — INSULIN-LIKE GROWTH FACTOR
IGF-I, LC/MS: 142 ng/mL (ref 53–331)
Z-Score (Female): 0 SD (ref ?–2.0)

## 2021-05-20 LAB — EXTRA SPECIMEN

## 2021-05-20 LAB — PROLACTIN: Prolactin: 8.5 ng/mL

## 2021-05-20 LAB — ACTH: C206 ACTH: 38 pg/mL (ref 6–50)

## 2021-05-25 LAB — ARGININE VASOPRESSIN HORMONE
ADH: 0.9 pg/mL (ref 0.0–4.7)
Osmolality Meas: 285 mOsmol/kg (ref 275–295)

## 2021-05-28 ENCOUNTER — Other Ambulatory Visit: Payer: Self-pay | Admitting: Internal Medicine

## 2021-05-28 MED ORDER — PINDOLOL 5 MG PO TABS: 5.0000 mg | ORAL_TABLET | Freq: Two times a day (BID) | ORAL | 0 refills | Status: DC

## 2021-06-14 ENCOUNTER — Telehealth: Payer: Self-pay | Admitting: Cardiology

## 2021-06-14 NOTE — Telephone Encounter (Signed)
Pt is not willing to wait until March to see Dr. Radford Pax as she has already waited 3 months for December appt.. pt would like to know if she could come in with her mom Georgena Weisheit on 12/14 and be seen... please advise

## 2021-06-24 ENCOUNTER — Ambulatory Visit: Payer: 59 | Admitting: Cardiology

## 2021-07-01 ENCOUNTER — Encounter: Payer: Self-pay | Admitting: Endocrinology

## 2021-07-12 ENCOUNTER — Encounter: Payer: Self-pay | Admitting: Sports Medicine

## 2021-07-15 ENCOUNTER — Encounter: Payer: Self-pay | Admitting: *Deleted

## 2021-07-16 ENCOUNTER — Other Ambulatory Visit: Payer: Self-pay | Admitting: Family Medicine

## 2021-07-16 MED ORDER — TRAMADOL HCL 50 MG PO TABS: ORAL_TABLET | ORAL | 3 refills | Status: DC

## 2021-07-16 NOTE — Progress Notes (Signed)
Refilled tramadol.

## 2021-07-23 ENCOUNTER — Other Ambulatory Visit: Payer: Self-pay | Admitting: Sports Medicine

## 2021-08-13 ENCOUNTER — Ambulatory Visit (INDEPENDENT_AMBULATORY_CARE_PROVIDER_SITE_OTHER): Payer: Managed Care, Other (non HMO) | Admitting: Sports Medicine

## 2021-08-13 ENCOUNTER — Other Ambulatory Visit: Payer: Self-pay

## 2021-08-13 VITALS — BP 110/70 | Ht 66.0 in | Wt 165.0 lb

## 2021-08-13 DIAGNOSIS — K30 Functional dyspepsia: Secondary | ICD-10-CM

## 2021-08-13 DIAGNOSIS — M2550 Pain in unspecified joint: Secondary | ICD-10-CM

## 2021-08-13 DIAGNOSIS — R278 Other lack of coordination: Secondary | ICD-10-CM

## 2021-08-13 DIAGNOSIS — R32 Unspecified urinary incontinence: Secondary | ICD-10-CM

## 2021-08-13 DIAGNOSIS — Q7962 Hypermobile Ehlers-Danlos syndrome: Secondary | ICD-10-CM | POA: Diagnosis not present

## 2021-08-13 DIAGNOSIS — D4709 Other mast cell neoplasms of uncertain behavior: Secondary | ICD-10-CM

## 2021-08-13 MED ORDER — CIMETIDINE 400 MG PO TABS: 400.0000 mg | ORAL_TABLET | Freq: Two times a day (BID) | ORAL | 3 refills | Status: DC

## 2021-08-13 NOTE — Progress Notes (Signed)
] PCP: Glenis Smoker, MD  Subjective:   HPI: Patient is a 36 y.o. female here for f/u of EDS and multiple concerns.  Hx of EDS with skin hypersensitivity -this has been well controlled on Singulair and Zyrtec daily.  No longer having the skin breakouts.  She has struggled with her weight over the last few years.  She has been walking 1-3 miles per day and attempt to lose weight.  When she walks longer distances, she feels like she will tripped over her right foot.  Feels like it does not pick up quite as well.  Denies any injury.  Does have a history of a labral repair about 2 years ago.  She does feel like she is more clumsy and has had some more issues with her balance over the last few months.  Does have a long history of POTS.  She does feel like on occasion she will drop objects with her hands which she has not done in the past.  At times will feel pain going down from the neck but denies any numbness or tingling or consistent radiation  Having issues with indigestion.  She has noticed that with citrus foods, lemon, etc. this will exacerbate her symptoms, although sometimes she has lives out of the blue.  She has been treating herself with over-the-counter Pepcid and at times will take an old prescription of omeprazole once every 2-3 days that does help improve her symptoms.  Also having some urinary leakage. Reports no urgency or hesitancy, but will have some dribbling after urinating when sitting down, etc.   Past Medical History:  Diagnosis Date   Bulging lumbar disc    X 2   PONV (postoperative nausea and vomiting)     Current Outpatient Medications on File Prior to Visit  Medication Sig Dispense Refill   cimetidine (TAGAMET) 400 MG tablet Take 1 tablet (400 mg total) by mouth 2 (two) times daily. 60 tablet 3   dexamethasone (DECADRON) 1 MG tablet Take 1 tablet (1 mg total) by mouth See admin instructions. 1 tablet 0   levonorgestrel-ethinyl estradiol (ALESSE) 0.1-20  MG-MCG tablet Take 1 tablet by mouth daily.      montelukast (SINGULAIR) 10 MG tablet TAKE 1 TABLET BY MOUTH AT BEDTIME 30 tablet 0   ondansetron (ZOFRAN) 4 MG tablet Take 1 tablet (4 mg total) by mouth every 6 (six) hours as needed for nausea or vomiting. 60 tablet 0   pindolol (VISKEN) 5 MG tablet Take 1 tablet (5 mg total) by mouth 2 (two) times daily. Please keep upcoming appt in December 2022 with Dr. Radford Pax before anymore refills. Thank you 180 tablet 0   traMADol (ULTRAM) 50 MG tablet Take 1-2 tabs every 12 hours as needed for pain 60 tablet 3   No current facility-administered medications on file prior to visit.    Past Surgical History:  Procedure Laterality Date   HIP ARTHROSCOPY Left 07/28/2019   HYSTEROSCOPY N/A 11/06/2017   Procedure: HYSTEROSCOPY;  Surgeon: Janyth Pupa, DO;  Location: [facility];  Service: Gynecology;  Laterality: N/A;   IUD REMOVAL N/A 11/06/2017   Procedure: INTRAUTERINE DEVICE (IUD) REMOVAL;  Surgeon: Janyth Pupa, DO;  Location: [facility];  Service: Gynecology;  Laterality: N/A;   LEEP  2013   TONSILLECTOMY  2005   WISDOM TOOTH EXTRACTION  2004    Allergies  Allergen Reactions   Codeine     MAKES HER DIZZY AND DISORIENTED    BP 110/70  Ht 5\' 6"  (1.676 m)    Wt 165 lb (74.8 kg)    BMI 26.63 kg/m   [facility] 04/05/2020  Frequency of aerobic exercise (# of days/week) 0  Average time in minutes 0  Frequency of strengthening activities (# of days/week) 0    No flowsheet data found.      Objective:  Physical Exam:  Gen: Well-appearing, in no acute distress; non-toxic CV: Regular Rate. Well-perfused. Warm.  Resp: Breathing unlabored on room air; no wheezing. Psych: Fluid speech in conversation; appropriate affect; normal thought process Neuro: Sensation intact throughout. No gross coordination deficits.  MSK:  - Notable joint laxity throughout - Cervical spine: No midline spinous process TTP; positive  sternocleidomastoid hypertonicity and TTP.  Full range of motion.  Negative Spurling's maneuver b/l. -Hips: No specific TTP over greater trochanter.  There is diminished hip abduction strength, left more noticeable than right.  5/5 strength in the L4-S1 dermatome.  2/4 patellar reflexes bilaterally. Heel-toe walking without difficulty.   Assessment & Plan:  1. Indigestion 2. Ehlers-danlos syndrome with joint hypermobility and skin manifestations 3. Weak hip abduction, left 4. Mast cell hypersensitivity   5. Clumsiness 6. Neck pain 7. Urinary incontinence  A lot of the above symptoms are due to her EDS.  she does have some left hip abduction weakness, likely from her previous labral repair about 2 years ago.  I believe the hip abduction weakness on left is causing the right foot gait to alter and sometimes drop, instead of a true neurologic or functional foot drop.  She was able to perform heel toe walking today without difficulty.   -We will begin proprioception exercises for upper and lower extremities -HEP prescribed and demonstrated for patient today: hip stabilization and abduction exercises, proprioception, neck isometric exercises - Pelvic floor strengthening exercises prescribed and demonstrated: Kegel's, pelvic floor, buttock isometric squeeze exercises -Begin Tagamet to help with her indigestion and her mast cell hypersensitivity; she is to continue Zyrtec and Singulair -We will follow-up with Dr. Oneida Alar as needed  Elba Barman, DO PGY-4, Sports Medicine Fellow [place]  I observed and examined the patient with the [facility] resident and agree with assessment and plan.  Note reviewed and modified by me. Ila Mcgill, MD

## 2021-08-15 ENCOUNTER — Other Ambulatory Visit: Payer: Self-pay

## 2021-08-15 ENCOUNTER — Telehealth (INDEPENDENT_AMBULATORY_CARE_PROVIDER_SITE_OTHER): Payer: Managed Care, Other (non HMO) | Admitting: Internal Medicine

## 2021-08-15 VITALS — BP 115/80 | HR 76 | Ht 66.0 in

## 2021-08-15 DIAGNOSIS — R002 Palpitations: Secondary | ICD-10-CM | POA: Diagnosis not present

## 2021-08-15 DIAGNOSIS — G901 Familial dysautonomia [Riley-Day]: Secondary | ICD-10-CM | POA: Diagnosis not present

## 2021-08-15 DIAGNOSIS — Q7962 Hypermobile Ehlers-Danlos syndrome: Secondary | ICD-10-CM

## 2021-08-15 DIAGNOSIS — I951 Orthostatic hypotension: Secondary | ICD-10-CM | POA: Diagnosis not present

## 2021-08-15 NOTE — Progress Notes (Signed)
Electrophysiology TeleHealth Note   Due to national recommendations of social distancing due to COVID 19, an audio/video telehealth visit is felt to be most appropriate for this patient at this time.  See MyChart message from today for the patient's consent to telehealth for [facility].   Date:  08/15/2021   ID:  Betty Lewis, DOB 01-09-1986, MRN [id number]  Location: patient's home  Provider location: [address] [place]  Evaluation Performed: Follow-up visit  PCP:  Glenis Smoker, MD  Cardiologist:     Electrophysiologist:  SK   Chief Complaint:     History of Present Illness:    Betty Lewis is a 36 y.o. female who presents via audio/video conferencing for a telehealth visit today.  Since last being seen in our clinic for POTS the symptoms in the setting of chronic pain associated with joint laxity/EDS and the patient reports that she is noted heart rates up to 100 105 on her Apple watch at night.  Dizziness has been sporadic.  Not problematic.  Volume repletion is going well.  The patient denies symptoms of fevers, chills, cough, or new SOB worrisome for COVID 19.    Past Medical History:  Diagnosis Date   Bulging lumbar disc    X 2   PONV (postoperative nausea and vomiting)     Past Surgical History:  Procedure Laterality Date   HIP ARTHROSCOPY Left 07/28/2019   HYSTEROSCOPY N/A 11/06/2017   Procedure: HYSTEROSCOPY;  Surgeon: Janyth Pupa, DO;  Location: [facility];  Service: Gynecology;  Laterality: N/A;   IUD REMOVAL N/A 11/06/2017   Procedure: INTRAUTERINE DEVICE (IUD) REMOVAL;  Surgeon: Janyth Pupa, DO;  Location: [facility];  Service: Gynecology;  Laterality: N/A;   LEEP  2013   TONSILLECTOMY  2005   WISDOM TOOTH EXTRACTION  2004    Current Outpatient Medications  Medication Sig Dispense Refill   cetirizine (ZYRTEC) 10 MG tablet Take 10 mg by mouth daily. In the morning     cimetidine (TAGAMET) 400 MG tablet Take 1 tablet (400  mg total) by mouth 2 (two) times daily. 60 tablet 3   montelukast (SINGULAIR) 10 MG tablet TAKE 1 TABLET BY MOUTH AT BEDTIME 30 tablet 0   pindolol (VISKEN) 5 MG tablet Take 1 tablet (5 mg total) by mouth 2 (two) times daily. Please keep upcoming appt in December 2022 with Dr. Radford Pax before anymore refills. Thank you 180 tablet 0   traMADol (ULTRAM) 50 MG tablet Take 1-2 tabs every 12 hours as needed for pain 60 tablet 3   No current facility-administered medications for this visit.    Allergies:   Codeine   Social History:  The patient  reports that she has never smoked. She has never used smokeless tobacco. She reports that she does not currently use alcohol. She reports that she does not use drugs.   Family History:  The patient's   family history includes CAD in her mother; Diabetes in her mother; Heart disease in her mother.   ROS:  Please see the history of present illness.   All other systems are personally reviewed and negative.    Exam:    Vital Signs:  BP 115/80    Pulse 76    Ht 5\' 6"  (1.676 m)    BMI 26.63 kg/m      Labs/Other Tests and Data Reviewed:    Recent Labs: 05/15/2021: BUN 11; Creatinine, Ser 0.71; Potassium 3.9; Sodium 138; TSH 3.48  Wt Readings from Last 3 Encounters:  08/13/21 165 lb (74.8 kg)  04/10/21 168 lb 12.8 oz (76.6 kg)  11/13/20 155 lb (70.3 kg)     Other studies personally reviewed:      ASSESSMENT & PLAN:    Nocturnal tachycardia  POTS   Ehlers-Danlos/joint hypermobility syndrome   Long discussion regarding the implications of the nocturnal tachycardia.  First and foremost the perturbation in rate is noteworthy the peak heart rate is not really significant   1 strategy would be to use monitoring and a time correlation between the Apple watch and a full disclosure; the other alternative was to try to address sleep in which her problems with recurrent pain might be triggering the heart rate and impairing sleep quality.  We discussed  the role of amitriptyline and trazadone    COVID 19 screen The patient denies symptoms of COVID 19 at this time.  The importance of social distancing was discussed today.  Follow-up:  26m THV    Current medicines are reviewed at length with the patient today.   The patient  concerns regarding her medicines.  The following changes were made today:  Rx trazadone 12.5 mg qhs  Labs/ tests ordered today include:   No orders of the defined types were placed in this encounter.     Patient Risk:  after full review of this patients clinical status, I feel that they are at moderate  risk at this time.  Today, I have spent 11 minutes with the patient with telehealth technology discussing the above.  Signed, Betty Axe, MD  08/15/2021 1:51 PM     [facility] [address] [place] [address] [phone] (office) [phone] (fax)

## 2021-08-16 ENCOUNTER — Encounter: Payer: Self-pay | Admitting: Internal Medicine

## 2021-08-19 ENCOUNTER — Encounter: Payer: Self-pay | Admitting: Internal Medicine

## 2021-08-19 MED ORDER — PINDOLOL 5 MG PO TABS: 5.0000 mg | ORAL_TABLET | Freq: Two times a day (BID) | ORAL | 3 refills | Status: DC

## 2021-08-20 IMAGING — CR DG HIP (WITH OR WITHOUT PELVIS) 2-3V*R*
3 series · 3 of 3 positions shown · non-contrast
Comparison: None.

CLINICAL DATA: 33-year-old female with a history of hyper mobile
joints

EXAM:
DG HIP (WITH OR WITHOUT PELVIS) 2-3V RIGHT

[t pelvis a.p.]
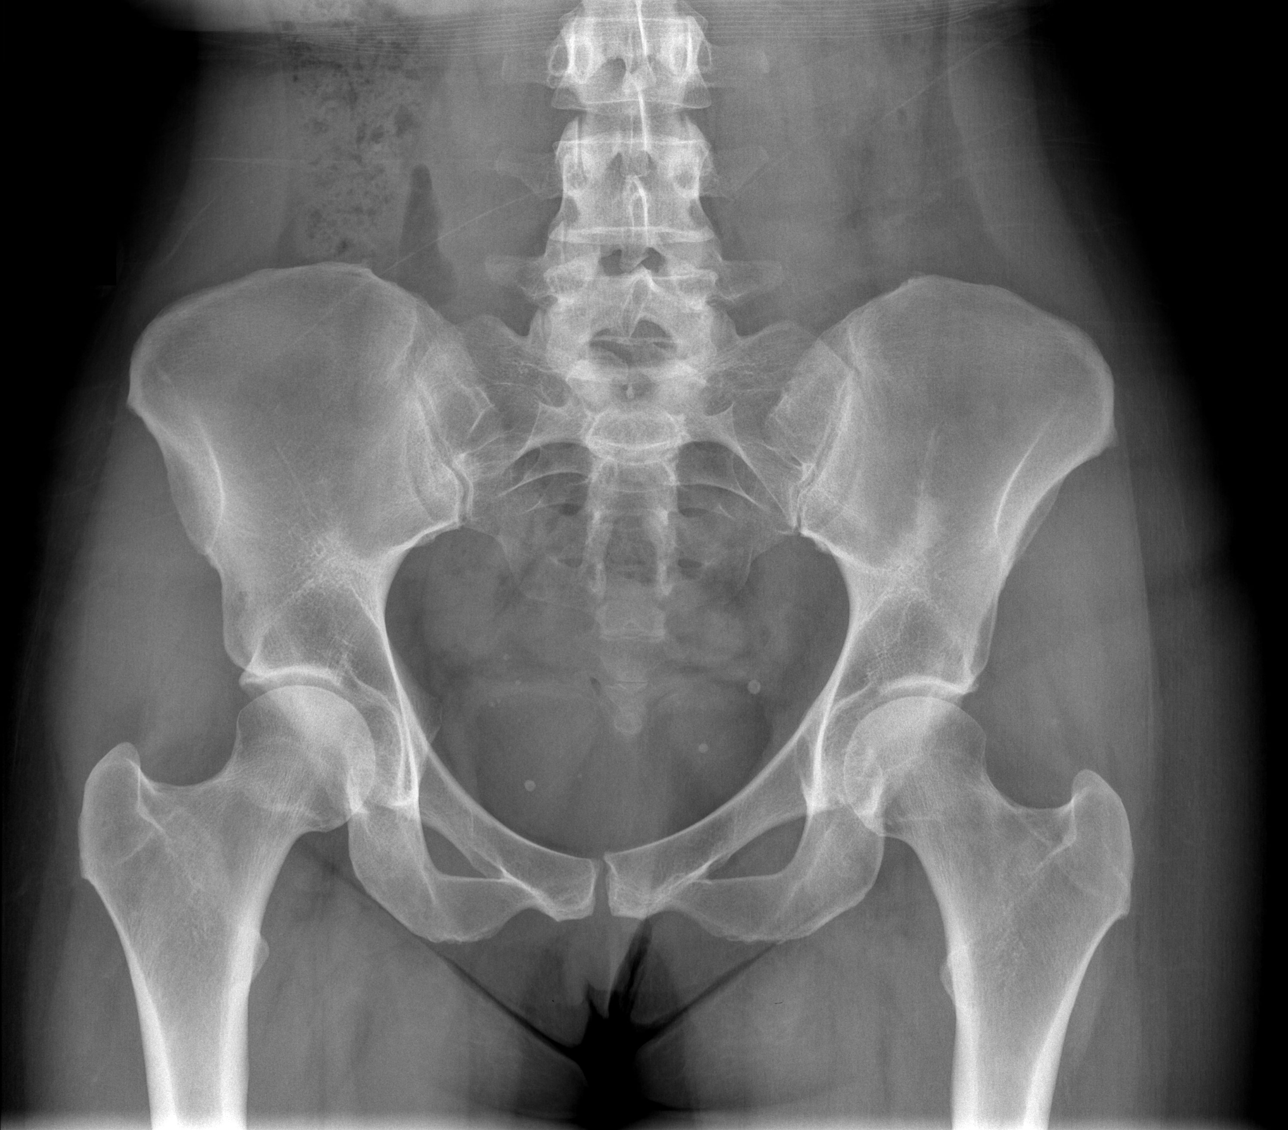

[t hip ap right]
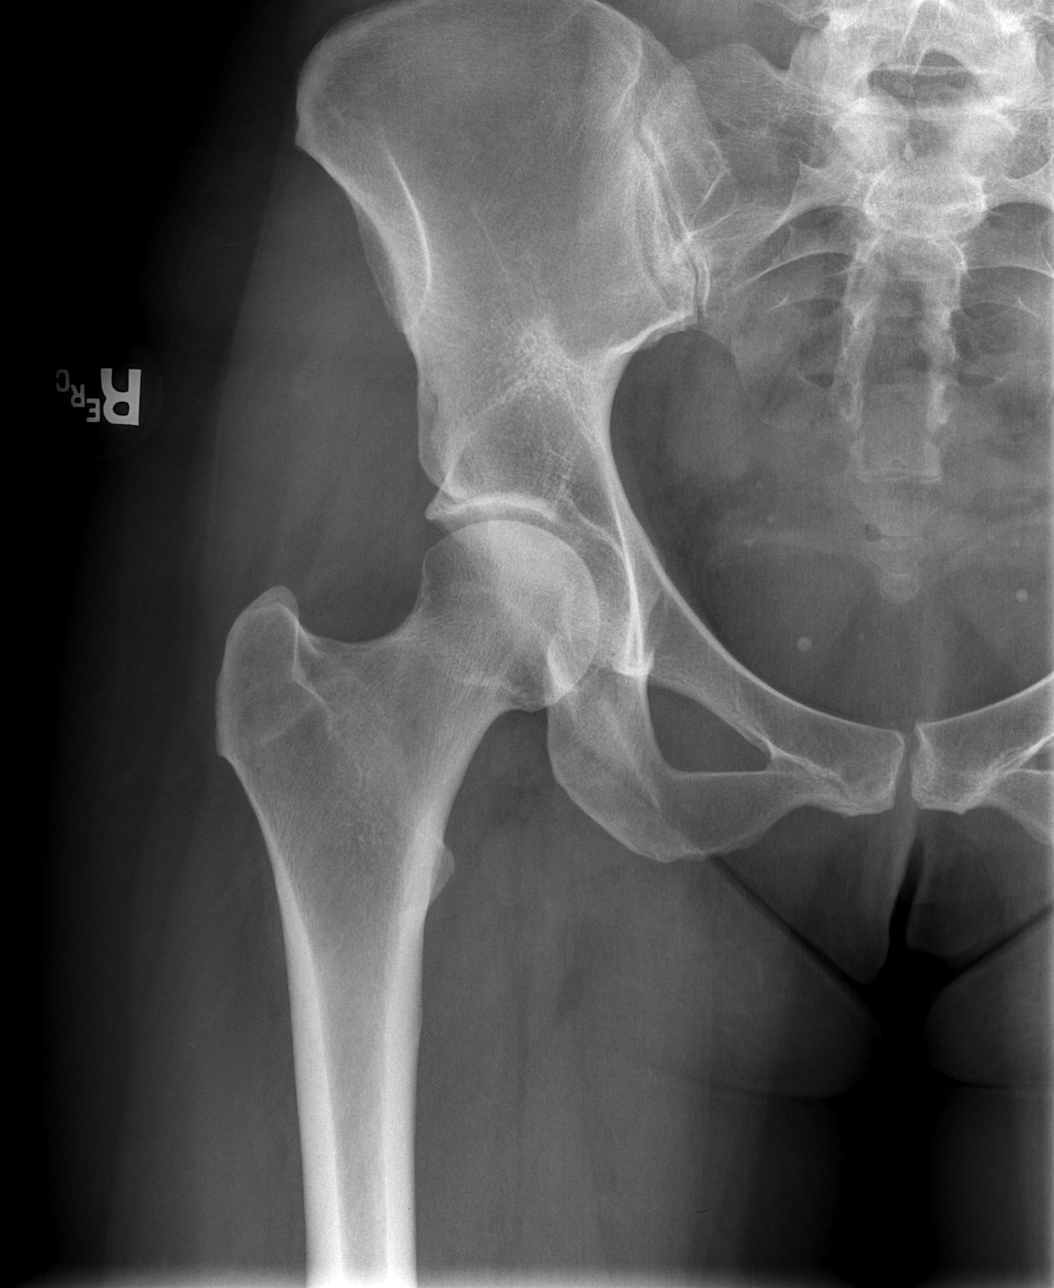

[t hip frog leg right]
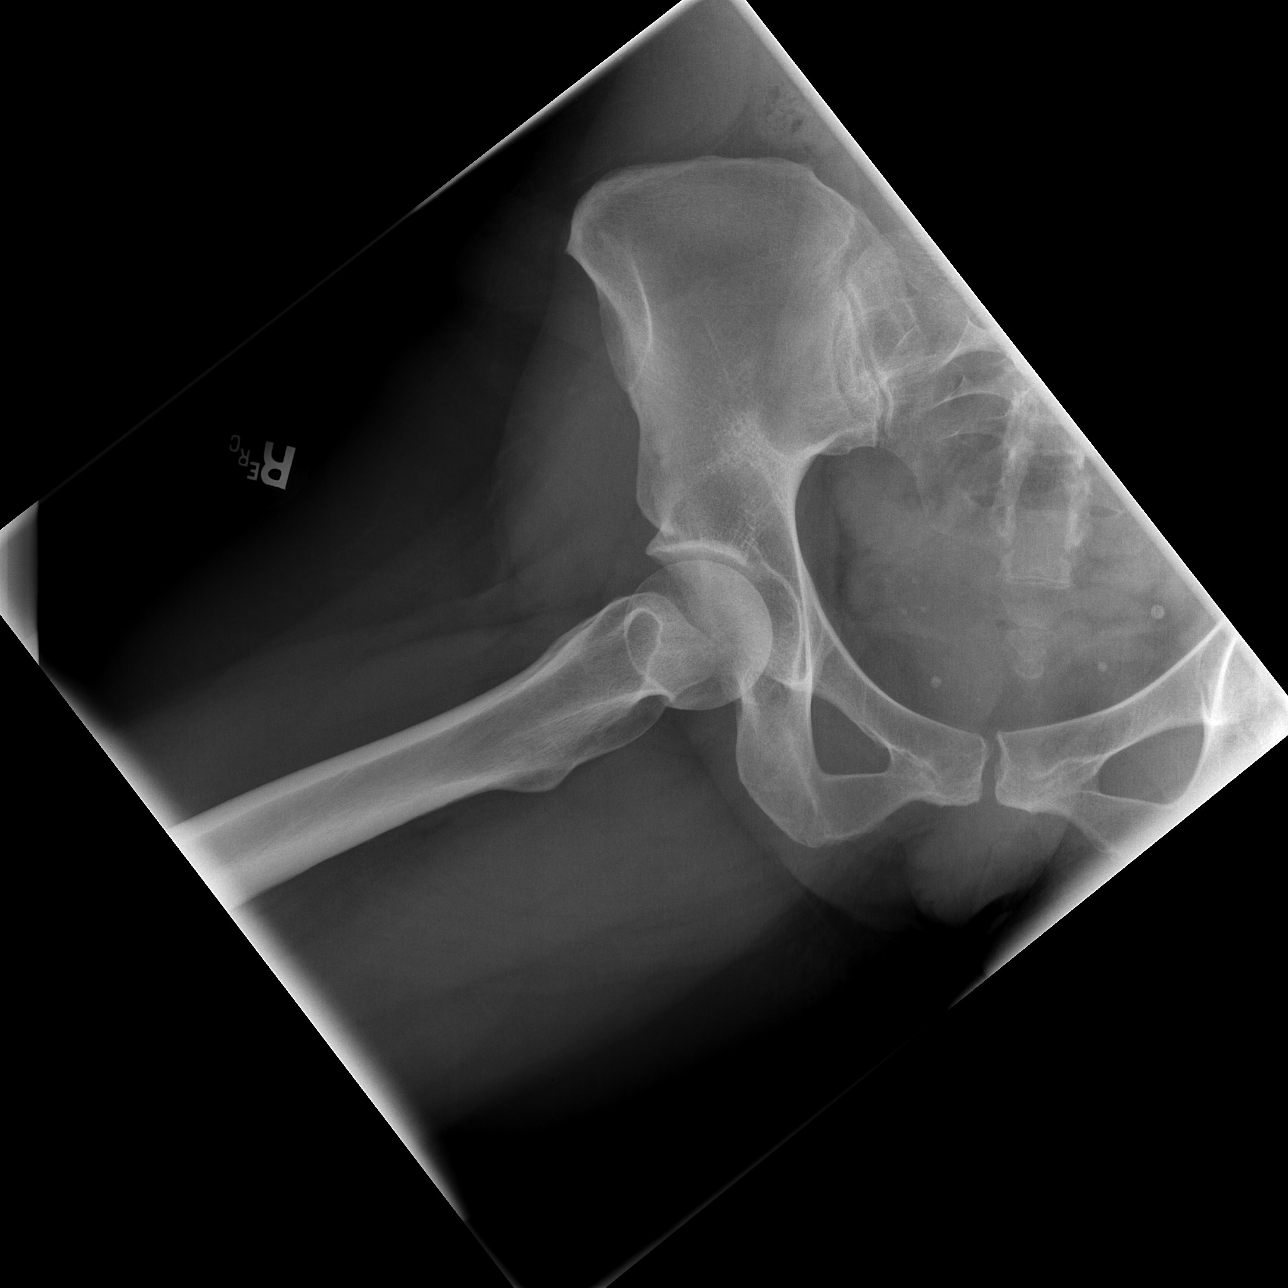

[3 of 3 positions shown; findings below may reference images not displayed]

FINDINGS: Bony pelvic ring intact. No acute displaced fracture. Bilateral hips
projects normally over the acetabula. Unremarkable appearance of the
proximal femurs. No significant degenerative changes. No radiopaque
foreign body. No evidence of joint effusion. Pelvic phleboliths.
IMPRESSION: Negative for acute bony abnormality.

## 2021-08-25 ENCOUNTER — Encounter: Payer: Self-pay | Admitting: Sports Medicine

## 2021-08-26 ENCOUNTER — Other Ambulatory Visit: Payer: Self-pay | Admitting: *Deleted

## 2021-08-26 MED ORDER — MONTELUKAST SODIUM 10 MG PO TABS: 10.0000 mg | ORAL_TABLET | Freq: Every day | ORAL | 0 refills | Status: DC

## 2021-08-26 MED ORDER — MONTELUKAST SODIUM 10 MG PO TABS: 10.0000 mg | ORAL_TABLET | Freq: Every day | ORAL | 3 refills | Status: DC

## 2021-08-28 ENCOUNTER — Other Ambulatory Visit: Payer: Self-pay | Admitting: Sports Medicine

## 2021-08-29 IMAGING — MR MR CARD MORPHOLOGY WO/W CM
44 of 48 series · 44 of 48 positions shown · IV contrast (gadavist)
Comparison: none

CLINICAL DATA: Ehlers Danlos Syndrome
Family history of Bicuspid Aortic Vavle

EXAM:
CARDIAC MRI
TECHNIQUE: The patient was scanned on a 1.5 Tesla GE magnet. A dedicated
cardiac coil was used. Functional imaging was done using Fiesta
sequences. [DATE], and 4 chamber views were done to assess for RWMA's.
Modified Chai rule using a short axis stack was used to
calculate an ejection fraction on a dedicated work station using
Circle software. The patient received 7 cc of Gadavist. After 10
minutes inversion recovery sequences were used to assess for
infiltration and scar tissue.
CONTRAST:  Gadavist

[Series 4: t2_haste_db_tra_bh · axial · 8.0mm · 1.33mm/px · 1 of 16 slices shown]
[im 1/16]
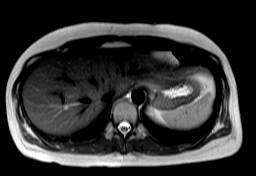

[Series 8: bSSFP · oblique · 8.0mm · 1.61mm/px · 1 of 25 slices shown (1 of 20)]
[im 1/25]
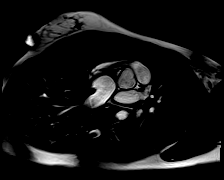

[Series 9: bSSFP · oblique · 8.0mm · 1.61mm/px · 1 of 25 slices shown (2 of 20)]
[im 1/25]
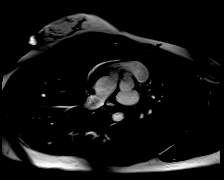

[Series 10: bSSFP · oblique · 8.0mm · 1.61mm/px · 1 of 25 slices shown (3 of 20)]
[im 1/25]
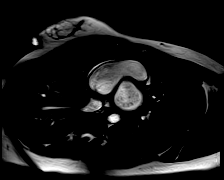

[Series 11: bSSFP · oblique · 8.0mm · 1.61mm/px · 1 of 25 slices shown (4 of 20)]
[im 1/25]
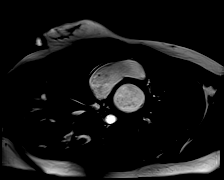

[Series 12: bSSFP · oblique · 8.0mm · 1.61mm/px · 1 of 25 slices shown (5 of 20)]
[im 1/25]
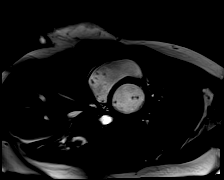

[Series 13: bSSFP · oblique · 8.0mm · 1.61mm/px · 1 of 25 slices shown (6 of 20)]
[im 1/25]
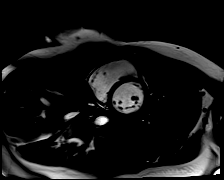

[Series 14: bSSFP · oblique · 8.0mm · 1.61mm/px · 1 of 25 slices shown (7 of 20)]
[im 1/25]
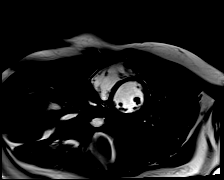

[Series 15: bSSFP · oblique · 8.0mm · 1.61mm/px · 1 of 25 slices shown (8 of 20)]
[im 1/25]
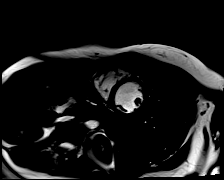

[Series 16: bSSFP · oblique · 8.0mm · 1.61mm/px · 1 of 25 slices shown (9 of 20)]
[im 1/25]
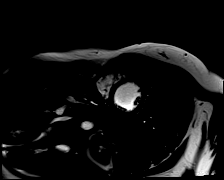

[Series 17: bSSFP · oblique · 8.0mm · 1.61mm/px · 1 of 25 slices shown (10 of 20)]
[im 1/25]
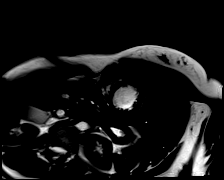

[Series 18: bSSFP · oblique · 8.0mm · 1.61mm/px · 1 of 25 slices shown (11 of 20)]
[im 1/25]
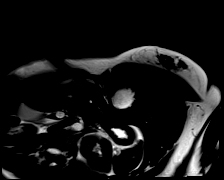

[Series 19: bSSFP · oblique · 8.0mm · 1.61mm/px · 1 of 25 slices shown (12 of 20)]
[im 1/25]
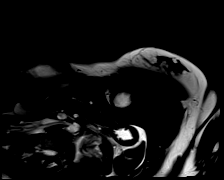

[Series 20: bSSFP · oblique · 8.0mm · 1.61mm/px · 1 of 25 slices shown (13 of 20)]
[im 1/25]
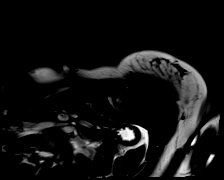

[Series 21: bSSFP · oblique · 8.0mm · 1.61mm/px · 1 of 25 slices shown (14 of 20)]
[im 1/25]
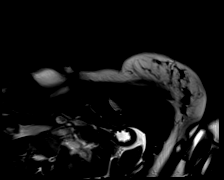

[Series 22: bSSFP · oblique · 8.0mm · 1.61mm/px · 1 of 25 slices shown (15 of 20)]
[im 1/25]
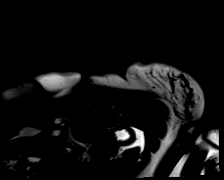

[Series 23: bSSFP · oblique · 8.0mm · 1.61mm/px · 1 of 25 slices shown (16 of 20)]
[im 1/25]
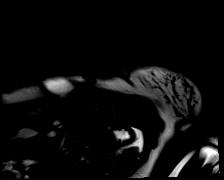

[Series 24: bSSFP · oblique · 6.0mm · 1.41mm/px · 1 of 25 slices shown (17 of 20)]
[im 1/25]
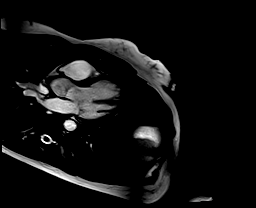

[Series 25: bSSFP · oblique · 6.0mm · 1.41mm/px · 1 of 25 slices shown (18 of 20)]
[im 1/25]
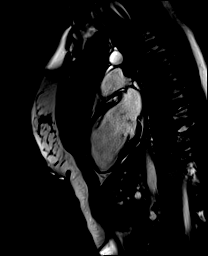

[Series 26: bSSFP · oblique · 6.0mm · 1.41mm/px · 1 of 25 slices shown (19 of 20)]
[im 1/25]
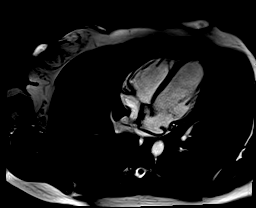

[Series 27: t2_trufi_tra_p2_bh · axial · 8.0mm · 0.62mm/px · 1 of 24 slices shown]
[im 1/24]
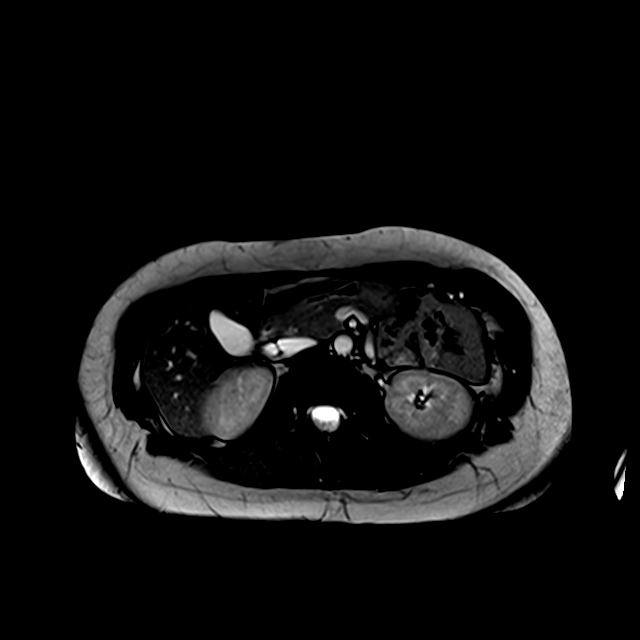

[Series 28: (id)_long_t1 · oblique · 8.0mm · 1.41mm/px · 1 of 24 slices shown]
[im 1/24]
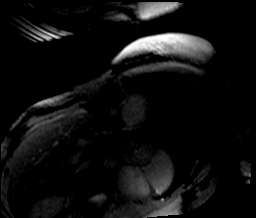

[Series 29: (id)_long_t1_moco · oblique · 8.0mm · 1.41mm/px · 1 of 24 slices shown]
[im 1/24]
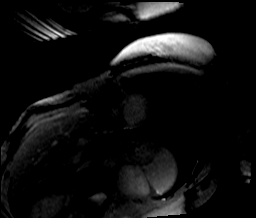

[Series 30: (id)_long_t1_moco_t1 · oblique · 8.0mm · 1.41mm/px · 1 of 6 slices shown]
[im 1/6]
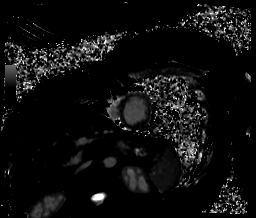

[Series 32: (id)_trufi · oblique · 8.0mm · 1.88mm/px · 1 of 9 slices shown]
[im 1/9]
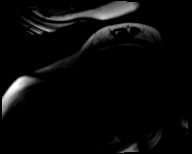

[Series 33: (id)_trufi_moco · oblique · 8.0mm · 1.88mm/px · 1 of 9 slices shown]
[im 1/9]
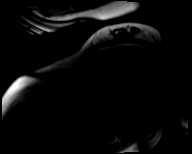

[Series 34: (id)_trufi_moco_t2 · oblique · 8.0mm · 1.88mm/px · 1 of 3 slices shown]
[im 1/3]
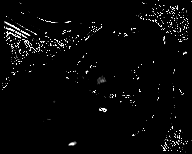

[Series 36: (person_name)_(person_name)_(person_name) · sagittal · 8.0mm · 1.61mm/px · 1 of 25 slices shown (1 of 6)]
[im 1/25]
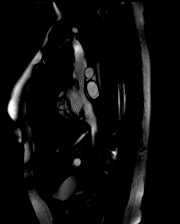

[Series 36: (person_name)_(person_name)_(person_name) · sagittal · 8.0mm · 1.61mm/px · 1 of 25 slices shown (2 of 6)]
[im 1/25]
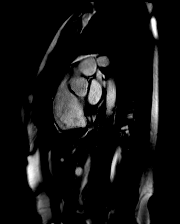

[Series 36: (person_name)_(person_name)_(person_name) · sagittal · 8.0mm · 1.61mm/px · 1 of 25 slices shown (3 of 6)]
[im 1/25]
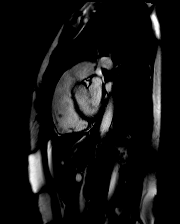

[Series 36: (person_name)_(person_name)_(person_name) · sagittal · 8.0mm · 1.61mm/px · 1 of 25 slices shown (4 of 6)]
[im 1/25]
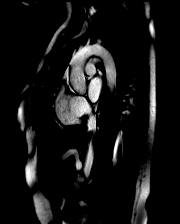

[Series 36: (person_name)_(person_name)_(person_name) · sagittal · 8.0mm · 1.61mm/px · 1 of 25 slices shown (5 of 6)]
[im 1/25]
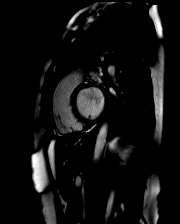

[Series 36: (person_name)_(person_name)_(person_name) · sagittal · 8.0mm · 1.61mm/px · 1 of 25 slices shown (6 of 6)]
[im 1/25]
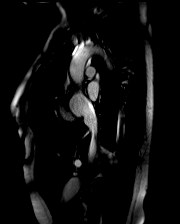

[Series 37: T1 dynamic · axial · non-contrast · 3.3mm · 1.06mm/px · 1 of 80 slices shown]
[im 1/80]
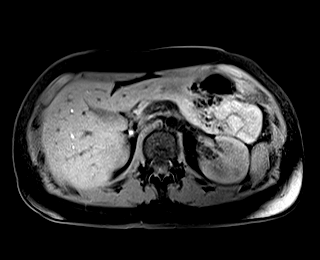

[Series 38: angio_fl3d_sag_pre · sagittal · 1.1mm · 1.17mm/px · 1 of 88 slices shown]
[im 1/88]
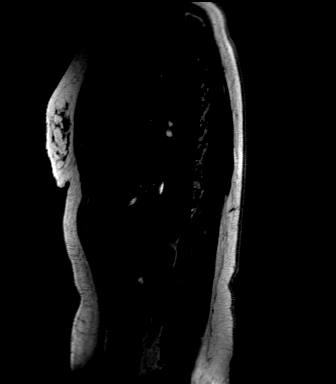

[Series 40: candy cane ce-arterial · sagittal · arterial · 1.1mm · 1.07mm/px · 1 of 88 slices shown]
[im 1/88]
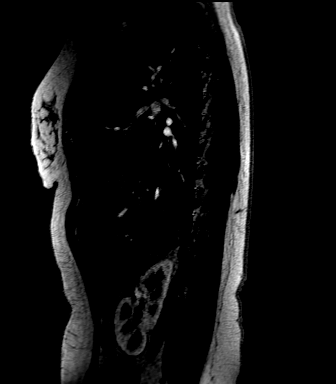

[Series 41: candy cane ce-venous · sagittal · portal-venous · 1.1mm · 1.07mm/px · 1 of 88 slices shown]
[im 1/88]
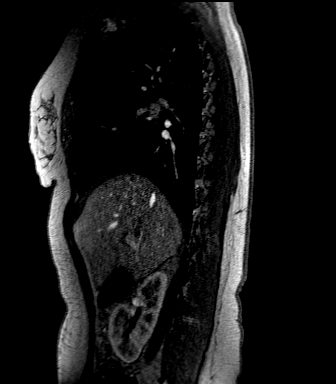

[Series 42: T1 dynamic post-contrast · axial · 3.3mm · 1.12mm/px · 1 of 80 slices shown]
[im 1/80]
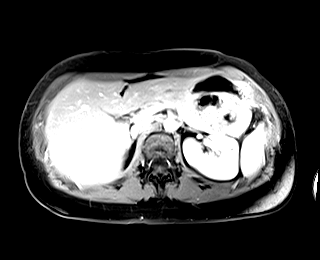

[Series 43: bSSFP · coronal · 6.0mm · 1.41mm/px · 1 of 25 slices shown (20 of 20)]
[im 1/25]
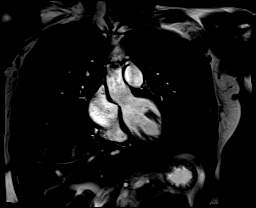

[Series 44: cine rvit · oblique · 6.0mm · 1.41mm/px · 1 of 25 slices shown]
[im 1/25]
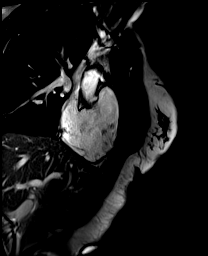

[Series 45: aortic valve cine · oblique · 6.0mm · 1.41mm/px · 1 of 25 slices shown]
[im 1/25]
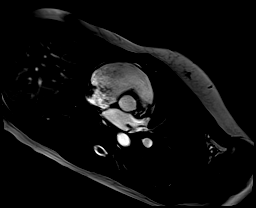

[Series 46: cine rvot · sagittal · 6.0mm · 1.41mm/px · 1 of 25 slices shown]
[im 1/25]
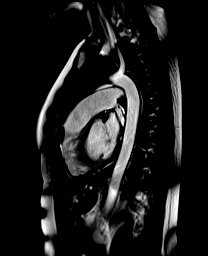

[Series 48: lge_single shot sa · oblique · 8.0mm · 1.98mm/px · 1 of 10 slices shown (1 of 2)]
[im 1/10]
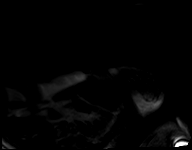

[Series 49: lge_single shot sa · oblique · 8.0mm · 1.98mm/px · 1 of 10 slices shown (2 of 2)]
[im 1/10]
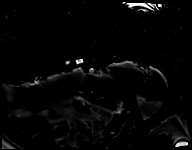

[44 of 48 positions shown; findings below may reference images not displayed]

FINDINGS: Normal atrial size. No ASD/PFO. No pericardial effusion. Normal RV
size and function. Normal LV size and function Quantitative EF 69%
(EDV 118 cc ESV 37 cc SV 81 cc) No delayed gadolinium uptake
inversion recovery sequences. Normal MV, TV and AV. Aortic valve is
tri leaflet. No AR/AS. Normal aortic root and arch vessels No
coarctation.

Aortic sinus: 27 mm

Aortic root 27 mm

Aortic Arch 20 mm

Descending Thoracic Aorta 20 mm
IMPRESSION: 1. Normal AV tri-leaflet with no AR/AS

2. Normal aortic root 2.7 cm see full measurements above No aneurysm

3.  Normal LVEF 69%

4.  Normal post gadolinium images with no hyper-enhancement

5.  Normal cardiac MRI

Unc Kohan

## 2021-09-03 ENCOUNTER — Encounter: Payer: Self-pay | Admitting: Internal Medicine

## 2021-09-24 ENCOUNTER — Ambulatory Visit: Payer: Managed Care, Other (non HMO) | Admitting: Sports Medicine

## 2021-09-24 DIAGNOSIS — J209 Acute bronchitis, unspecified: Secondary | ICD-10-CM | POA: Diagnosis not present

## 2021-09-24 DIAGNOSIS — Q7962 Hypermobile Ehlers-Danlos syndrome: Secondary | ICD-10-CM

## 2021-09-24 MED ORDER — AZITHROMYCIN 250 MG PO TABS: ORAL_TABLET | ORAL | 0 refills | Status: DC

## 2021-09-24 NOTE — Assessment & Plan Note (Signed)
I went ahead and started her on a Z-Pak as she has had 3 straight weeks of coughing ?Even though she has some crackles in the left lower lobe I do not think she has pneumonia on a clinical basis ?If she is not improving after a week of the Z-Pak however I believe we should get a chest x-ray ?We will touch base with her in 1 week ?

## 2021-09-24 NOTE — Progress Notes (Signed)
Patient comes in for a follow-up of her Ehlers-Danlos and some acute issues ? 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 ?For her persistent nausea we added Tagamet and this has helped both her scan and she has much less indigestion than she was having before ? ?Joints right now are in a pretty good status with only mild pain and no recent subluxations or dislocations ? ?Last month after traveling she developed an earache and congestion ?She was seen at urgent care and treated with amoxicillin for otitis media ?Now however she has had 3 weeks of coughing and the coughing causes some pain in her left lung ? ?Review of systems does not reveal any fever or rhinitis.  She did have some sore throat but most of that has resolved ? ?Physical exam today is focused on ENT and lung ?She is in no acute distress ?BP 103/60   Temp 98.4 ?F (36.9 ?C) (Oral)   Ht '5\' 6"'$  (1.676 m)   Wt 165 lb (74.8 kg)   BMI 26.63 kg/m?  ?ENT exam reveals that both tympanic membranes are normal ?There is no redness or erythema in the throat ?No enlarged cervical nodes although there is some tenderness along the chain ?She has a staccato cough that sounds sometimes like it is deep in her chest ?On auscultation she has some crackles in the left lower lobe but no wheezes or other changes ?Heart rate is regular at about 60 ?

## 2021-09-24 NOTE — Assessment & Plan Note (Signed)
Overall most of the complications of her Ehlers-Danlos seem to be stable ?I made no changes in her medications with the exception of adding an antibiotic ?I will continue to monitor her every 3 months ?

## 2021-10-14 ENCOUNTER — Telehealth: Payer: Self-pay | Admitting: *Deleted

## 2021-10-14 ENCOUNTER — Encounter: Payer: Self-pay | Admitting: Cardiology

## 2021-10-14 ENCOUNTER — Ambulatory Visit: Payer: Managed Care, Other (non HMO) | Admitting: Cardiology

## 2021-10-14 VITALS — BP 122/74 | HR 79 | Ht 66.0 in | Wt 192.0 lb

## 2021-10-14 DIAGNOSIS — G90A Postural orthostatic tachycardia syndrome (POTS): Secondary | ICD-10-CM | POA: Diagnosis not present

## 2021-10-14 DIAGNOSIS — Q796 Ehlers-Danlos syndrome, unspecified: Secondary | ICD-10-CM | POA: Diagnosis not present

## 2021-10-14 NOTE — Progress Notes (Signed)
?Date:  10/14/2021  ? ?ID:  Betty Lewis, DOB 12-Oct-1985, MRN [id number] ? ? ?PCP:  Glenis Smoker, MD  ?Cardiologist:  Fransico Him, MD ?Electrophysiologist:  Virl Axe, MD  ? ?Chief Complaint:  Dizziness, syncope  ? ?History of Present Illness:   ? ?Betty Lewis is a 36 y.o. female with POTS  and  Ehlers Danlos Syndrome .  She is followed by Dr. Caryl Comes for her POTS-like symptoms. She was evaluated by Rheumatology who feels she meets criteria for EDS and was referred for genetic testing. She was started on propranolol for POTS-like symptoms but there was no improvement in her symptoms. She was changed to Pindolol with overall symptoms improved with no more heart racing  Unfortunately the Pindolol was cost prohibitive and tried Metoprolol tartrate. She wore a Ziopatch for palpitations which was normal.  She was encouraged to exercise but due to her EDS, she inured her ankle requiring surgery.  Her mom had a bicuspid AoV and aorta repair at 92. She has tricuspid valve and normal Ao. She is now followed by Dr. Caryl Comes for her POTS. ? ?She is here today for followup and is doing well.  She tells me that she is noticing that her heart rate shoots up on her Apple watch at night to 100-105bpm.  She says that it does not wake her up but she is very concerned.  She has also gained over 50lbs in the past year and cannot lose weight.  She is extremely upset and crying in the office today. She does still have some dizziness at times.  She has some LE edema when standing for long periods of time.  ? ?Prior CV studies:   ?The following studies were reviewed today: ? ?2D echo, cMRI ? ?Past Medical History:  ?Diagnosis Date  ? Bulging lumbar disc   ? X 2  ? PONV (postoperative nausea and vomiting)   ? ?Past Surgical History:  ?Procedure Laterality Date  ? HIP ARTHROSCOPY Left 07/28/2019  ? HYSTEROSCOPY N/A 11/06/2017  ? Procedure: HYSTEROSCOPY;  Surgeon: Janyth Pupa, DO;  Location: [facility];  Service: Gynecology;   Laterality: N/A;  ? IUD REMOVAL N/A 11/06/2017  ? Procedure: INTRAUTERINE DEVICE (IUD) REMOVAL;  Surgeon: Janyth Pupa, DO;  Location: [facility];  Service: Gynecology;  Laterality: N/A;  ? LEEP  2013  ? TONSILLECTOMY  2005  ? [place] EXTRACTION  2004  ?  ? ?Current Meds  ?Medication Sig  ? AVIANE 0.1-20 MG-MCG tablet Take 1 tablet by mouth daily.  ? azithromycin (ZITHROMAX Z-PAK) 250 MG tablet Take as directed  ? cetirizine (ZYRTEC) 10 MG tablet Take 10 mg by mouth daily. In the morning  ? cimetidine (TAGAMET) 400 MG tablet Take 1 tablet (400 mg total) by mouth 2 (two) times daily.  ? montelukast (SINGULAIR) 10 MG tablet Take 1 tablet (10 mg total) by mouth at bedtime.  ? pindolol (VISKEN) 5 MG tablet Take 1 tablet (5 mg total) by mouth 2 (two) times daily.  ? traMADol (ULTRAM) 50 MG tablet Take 1-2 tabs every 12 hours as needed for pain  ?  ? ?Allergies:   Codeine  ? ?Social History  ? ?Tobacco Use  ? Smoking status: Never  ? Smokeless tobacco: Never  ?Vaping Use  ? Vaping Use: Never used  ?Substance Use Topics  ? Alcohol use: Not Currently  ? Drug use: Never  ?  ? ?Family Hx: ?The patient's family history includes CAD in her mother; Diabetes in  her mother; Heart disease in her mother. ? ?ROS:   ?Please see the history of present illness.    ? ?All other systems reviewed and are negative. ? ? ?Labs/Other Tests and Data Reviewed:   ? ?Recent Labs: ?05/15/2021: BUN 11; Creatinine, Ser 0.71; Potassium 3.9; Sodium 138; TSH 3.48  ? ?Recent Lipid Panel ?No results found for: CHOL, TRIG, HDL, CHOLHDL, LDLCALC, LDLDIRECT ? ?Wt Readings from Last 3 Encounters:  ?10/14/21 192 lb (87.1 kg)  ?09/24/21 165 lb (74.8 kg)  ?08/13/21 165 lb (74.8 kg)  ?  ? ?Objective:   ? ?Vital Signs:  BP 122/74   Pulse 79   Ht '5\' 6"'$  (1.676 m)   Wt 192 lb (87.1 kg)   SpO2 98%   BMI 30.99 kg/m?   ? ?GEN: Well nourished, well developed in no acute distress ?HEENT: Normal ?NECK: No JVD; No carotid bruits ?LYMPHATICS: No  lymphadenopathy ?CARDIAC:RRR, no murmurs, rubs, gallops ?RESPIRATORY:  Clear to auscultation without rales, wheezing or rhonchi  ?ABDOMEN: Soft, non-tender, non-distended ?MUSCULOSKELETAL:  No edema; No deformity  ?SKIN: Warm and dry ?NEUROLOGIC:  Alert and oriented x 3 ?PSYCHIATRIC:  Normal affect   ? ?ASSESSMENT & PLAN:   ? ?1.  Ehler-Danlos Syndrome ?-she is followed by Rheumatology and Sports Medicine for Hypermobility ?-she has a hx of torn right shoulder SLAP tear, bulging disks in low back and recent hip surgery for labral arthroscopy ?-2D echo 06/2019 was normal ?-her mom has BAV and surgical repair of aorta at age 26 ?-cMRI/MRA showed normal AV and no aortic aneurysm ?-would recommend repeat cMRI/MRA in 5 years ? ?2.  POTS syndrome ?-Followed by Dr. Caryl Comes ?-symptoms improved on Pindolol  ?-continue Prescription drug management with Pindolol '5mg'$  BID with PRN refills ? ?3.  Borderline tachycardia at night ?-suspect this is related to dreaming although could be a sign of OSA ?-the palpitations do not wake her up at night and only notices it on her Apple watch in the am ?-she feels chronically fatigued  ?-I will get an Itamar sleep study ? ? ?Medication Adjustments/Labs and Tests Ordered: ?Current medicines are reviewed at length with the patient today.  Concerns regarding medicines are outlined above.  ?Tests Ordered: ?Orders Placed This Encounter  ?Procedures  ? EKG 12-Lead  ? ?Medication Changes: ?No orders of the defined types were placed in this encounter. ? ? ?Disposition:  Follow up in 1 year(s) ? ?Signed, ?Fransico Him, MD  ?10/14/2021 8:43 AM    ?[place] ?

## 2021-10-14 NOTE — Addendum Note (Signed)
Addended by: Antonieta Iba on: 10/14/2021 08:49 AM ? ? Modules accepted: Orders ? ?

## 2021-10-14 NOTE — Telephone Encounter (Signed)
Pt was seen today by Dr. Radford Pax who ordered an Itamar study for the pt. Pt has uploaded application on to her phone. Pt aware to not open the box until she has been given the PIN#. Pt aware we will call with PIN once we have auth from insurance.  ?

## 2021-10-14 NOTE — Patient Instructions (Signed)
Medication Instructions:  ?Your physician recommends that you continue on your current medications as directed. Please refer to the Current Medication list given to you today. ? ? ?*If you need a refill on your cardiac medications before your next appointment, please call your pharmacy* ? ?Testing/Procedures: ?Your physician has recommended that you have a sleep study. This test records several body functions during sleep, including: brain activity, eye movement, oxygen and carbon dioxide blood levels, heart rate and rhythm, breathing rate and rhythm, the flow of air through your mouth and nose, snoring, body muscle movements, and chest and belly movement. ? ?Follow-Up: ?At [facility], you and your health needs are our priority.  As part of our continuing mission to provide you with exceptional heart care, we have created designated Provider Care Teams.  These Care Teams include your primary Cardiologist (physician) and Advanced Practice Providers (APPs -  Physician Assistants and Nurse Practitioners) who all work together to provide you with the care you need, when you need it. ? ?Follow up with Dr. Caryl Comes as scheduled.  ?

## 2021-10-14 NOTE — Telephone Encounter (Signed)
Set up date 10/14/21 ?

## 2021-10-15 ENCOUNTER — Encounter: Payer: Self-pay | Admitting: Sports Medicine

## 2021-10-16 NOTE — Telephone Encounter (Signed)
Called and made the patient aware that she may proceed with the [facility] Sleep Study. PIN # provided to the patient. Patient made aware that she will be contacted after the test has been read with the results and any recommendations. Patient verbalized understanding and thanked me for the call.  ? ?Pt will do early next week after Easter Holiday.  ?

## 2021-10-16 NOTE — Telephone Encounter (Signed)
APPROVED TURNER READ NO PA REQUIRED ?AUTH #[id number] ?

## 2021-10-22 ENCOUNTER — Ambulatory Visit: Payer: Managed Care, Other (non HMO) | Admitting: Sports Medicine

## 2021-10-22 DIAGNOSIS — E669 Obesity, unspecified: Secondary | ICD-10-CM | POA: Insufficient documentation

## 2021-10-22 DIAGNOSIS — Z6830 Body mass index (BMI) 30.0-30.9, adult: Secondary | ICD-10-CM | POA: Diagnosis not present

## 2021-10-22 NOTE — Assessment & Plan Note (Addendum)
She had a thorough work up by Dr Arnoldo Lenis ?No cause of her rapid weight gain has emerged ? ?We will try a self directed fasting and diet program ?Continue exercise ?See progressive fasting intervals for 6 weeks ?High fruit/ high fiber diet with low protein ? ?If not showing progress - eg 1 lb per week we will consider referral to a obesity management ?

## 2021-10-22 NOTE — Patient Instructions (Signed)
Dietary intervention Fruit and Fasting based ? ?Goals 5 fruit servings per day ?Greens 4 to 6 per day ?Protein - usually small servings and 20 to 30 gms per day ?Lots of water ? ?Fasting ?Start at 8 hour fasts first 2 weeks ?Weeks 3 and 4 - 12 hour fast ?Weeks 5 and 6 - 14 hour? ? ?Generally every other day ? ?Generally this means 2 meals day ? ?Snacks that are salty are OK to lessen POTS ? ?Ideally see 1 lb loss per week ?

## 2021-10-22 NOTE — Progress Notes (Signed)
CC: weight gain ? ?Patient has been evaluated for weight gain with endocrine work up and no abnormalities noted ?She has EDS ?Review of medicines do not reveal any that would cause weight gain ?Has tried high protein diet ?She recalls her diet as: ?Yogurt and fruit in AM ?Salad and small serving meat at lunch ?Dinner with 2 vegetables or 3 and small meat serving ?Water is primary drink ? ?Exercises most days ?No alcohol ?Limits sweets ? ?Daily calories usually 1700 ? ?PE ?Pleasant F in NAD ?BP 107/61   Ht '5\' 6"'$  (1.676 m)   Wt 192 lb (87.1 kg)   BMI 30.99 kg/m?  ? ?Documented weight 05/31/20 was 145 lbs ? ? ?

## 2021-10-24 ENCOUNTER — Encounter (INDEPENDENT_AMBULATORY_CARE_PROVIDER_SITE_OTHER): Payer: Managed Care, Other (non HMO) | Admitting: Cardiology

## 2021-10-24 DIAGNOSIS — G90A Postural orthostatic tachycardia syndrome (POTS): Secondary | ICD-10-CM

## 2021-10-24 DIAGNOSIS — R635 Abnormal weight gain: Secondary | ICD-10-CM | POA: Diagnosis not present

## 2021-10-27 NOTE — Procedures (Signed)
? ?  SLEEP STUDY REPORT ?Patient Information ?Study Date: 10/24/21 ?Patient Name: Betty Lewis ?Patient ID: [id number] ?Birth Date: 1985-12-01 ?Age: 36 ?Gender: Female ?BMI: 30.8 (W=192 lb, H=5' 6'') ?Neck Circ.: 15 '' ?Referring Physician: Fransico Him, MD ? ?TEST DESCRIPTION: Home sleep apnea testing was completed using the WatchPat, a Type 1 device, utilizing ?peripheral arterial tonometry (PAT), chest movement, actigraphy, pulse oximetry, pulse rate, body position and snore. ?AHI was calculated with apnea and hypopnea using valid sleep time as the denominator. RDI includes apneas, ?hypopneas, and RERAs. The data acquired and the scoring of sleep and all associated events were performed in ?accordance with the recommended standards and specifications as outlined in the AASM Manual for the Scoring of ?Sleep and Associated Events 2.2.0 (2015). ? ?FINDINGS: ?1. No evidence of Obstructive Sleep Apnea with AHI /1.2hr. ?2. No Central Sleep Apnea. ?3. Oxygen desaturations as low as 78%. ?4. Minimal snoring was present. O2 sats were < 88% for 0.2 minutes. ?5. Total sleep time was 4 hrs and 57 min. ?6. 15.5% of total sleep time was spent in REM sleep. ?7. Normal sleep onset latency at 16 min. ?8. Prolonged REM sleep onset latency at 107 min. ?9. Total awakenings were 12. ? ?DIAGNOSIS: ?Normal study with no significant sleep disordered breathing. ? ?RECOMMENDATIONS: ?1. Normal study with no significant sleep disordered breathing. ? ?2. Healthy sleep recommendations include: adequate nightly sleep (normal 7-9 hrs/night), avoidance of caffeine after ?noon and alcohol near bedtime, and maintaining a sleep environment that is cool, dark and quiet. ? ?3. Weight loss for overweight patients is recommended. ? ?4. Snoring recommendations include: weight loss where appropriate, side sleeping, and avoidance of alcohol before ?bed. ? ?5. Operation of motor vehicle or dangerous equipment must be avoided when feeling drowsy, excessively  sleepy, or ?mentally fatigued. ? ?6. An ENT consultation which may be useful for specific causes of and possible treatment of bothersome snoring . ? ?7. Weight loss may be of benefit in reducing the severity of snoring.  ? ?Signature: ?Electronically Signed: 10/27/21 ?Fransico Him, MD; [facility]; Diplomat, American Board of ?Sleep Medicine ?Report prepared by: Fransico Him ?

## 2021-10-30 ENCOUNTER — Ambulatory Visit: Payer: Managed Care, Other (non HMO)

## 2021-10-30 DIAGNOSIS — Q796 Ehlers-Danlos syndrome, unspecified: Secondary | ICD-10-CM

## 2021-10-30 DIAGNOSIS — G90A Postural orthostatic tachycardia syndrome (POTS): Secondary | ICD-10-CM

## 2021-11-04 ENCOUNTER — Encounter: Payer: Self-pay | Admitting: Sports Medicine

## 2021-11-05 ENCOUNTER — Telehealth: Payer: Self-pay | Admitting: *Deleted

## 2021-11-05 ENCOUNTER — Other Ambulatory Visit: Payer: Self-pay | Admitting: *Deleted

## 2021-11-05 ENCOUNTER — Other Ambulatory Visit: Payer: Self-pay

## 2021-11-05 MED ORDER — TRAMADOL HCL 50 MG PO TABS: ORAL_TABLET | ORAL | 3 refills | Status: DC

## 2021-11-05 NOTE — Telephone Encounter (Signed)
The patient has been notified of the result and verbalized understanding.  All questions (if any) were answered. ?Marolyn Hammock, [place] 11/05/2021 6:05 PM   ? ?Per pdr Left detailed message on voicemail   and informed patient to call back with questions. ?

## 2021-11-05 NOTE — Telephone Encounter (Signed)
-----   Message from Lauralee Evener, [place] sent at 10/29/2021  8:26 AM EDT ----- ? ?----- Message ----- ?From: Sueanne Margarita, MD ?Sent: 10/27/2021  11:22 AM EDT ?To: [facility] Div Sleep Studies ? ?Please let patient know that sleep study showed no significant sleep apnea.   ?  ? ?

## 2021-11-06 ENCOUNTER — Other Ambulatory Visit: Payer: Self-pay | Admitting: Sports Medicine

## 2021-11-06 MED ORDER — TRAMADOL HCL 50 MG PO TABS: ORAL_TABLET | ORAL | 3 refills | Status: DC

## 2021-11-08 IMAGING — RF DG ESOPHAGUS
1 series · 10 of 10 positions shown · non-contrast
Comparison: Chest MRA 01/26/2020

CLINICAL DATA: Dysphagia, pharyngo esophageal phase.

EXAM:
ESOPHOGRAM / BARIUM SWALLOW / BARIUM TABLET STUDY
TECHNIQUE: Combined double contrast and single contrast examination performed
using effervescent crystals, thick barium liquid, and thin barium
liquid. The patient was observed with fluoroscopy swallowing a 13 mm
barium sulphate tablet.
FLUOROSCOPY TIME:  Fluoroscopy Time: 1 minutes and 24 seconds of
low-dose pulsed fluoroscopy
Radiation Exposure Index (if provided by the fluoroscopic device):
32.2 mGy
Number of Acquired Spot Images: 0

[Series 1: one shot · 10 of 10 slices shown]
[im 1/10]
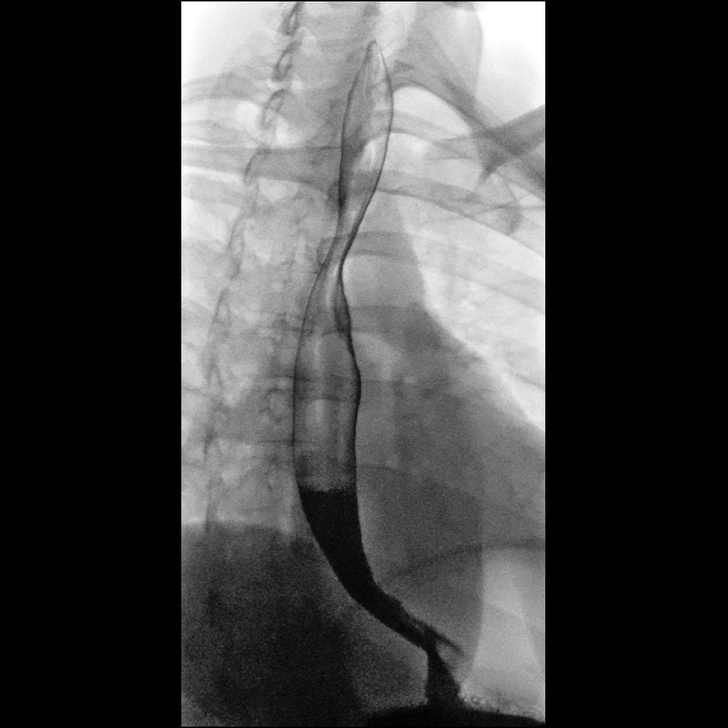
[im 2/10]
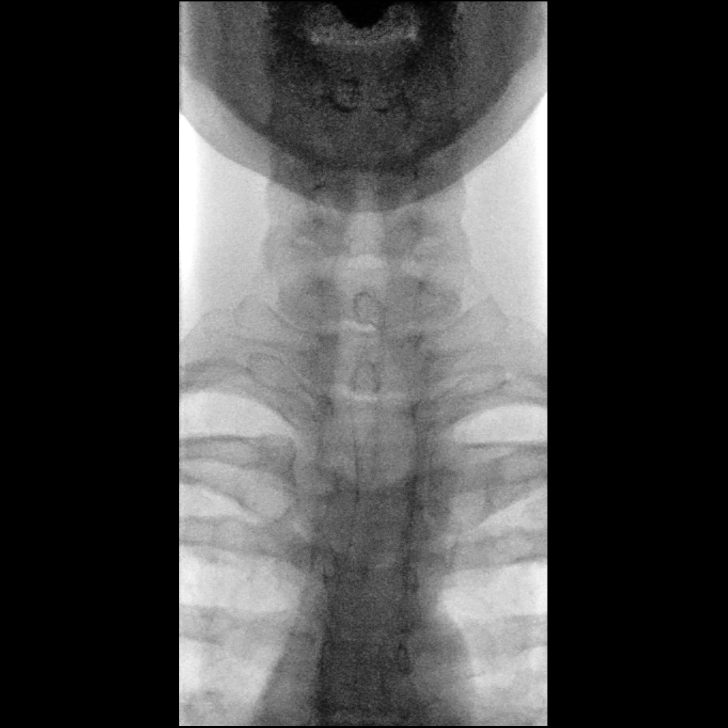
[im 3/10]
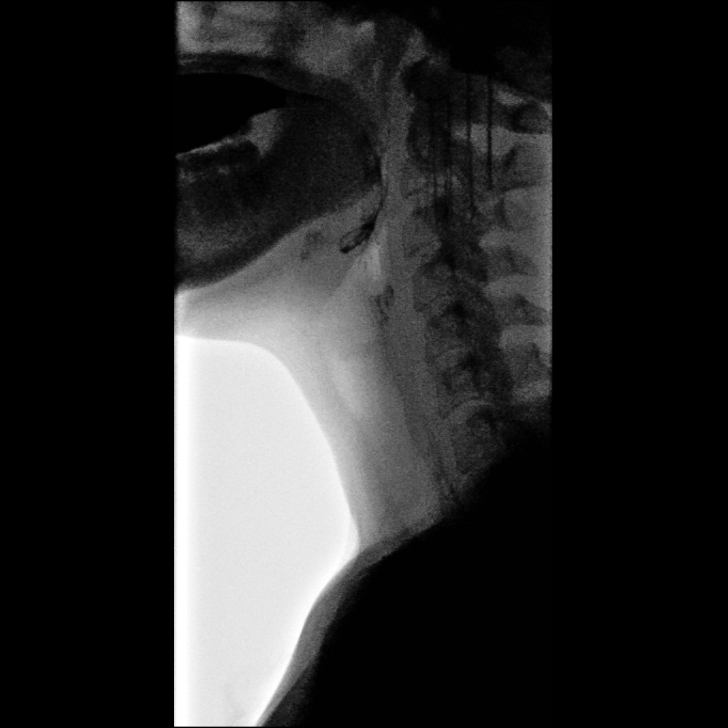
[im 4/10]
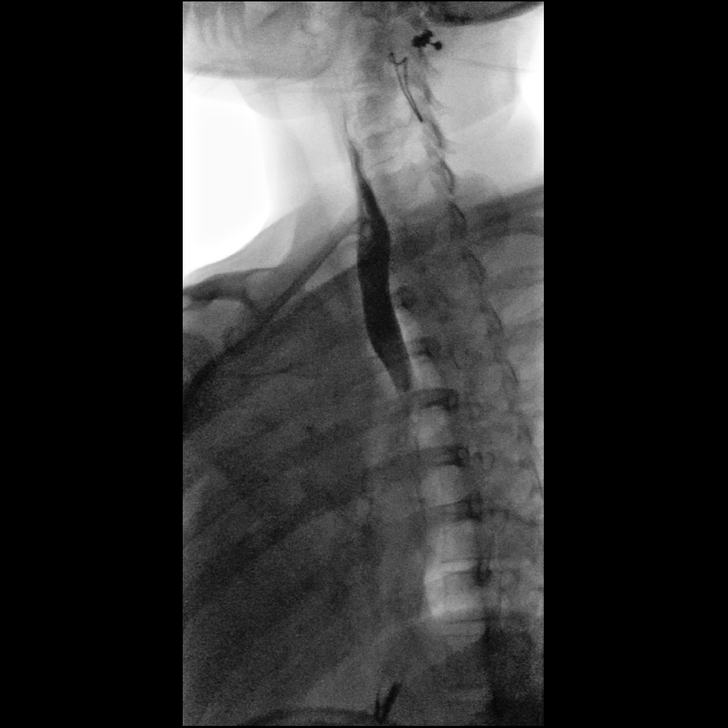
[im 5/10]
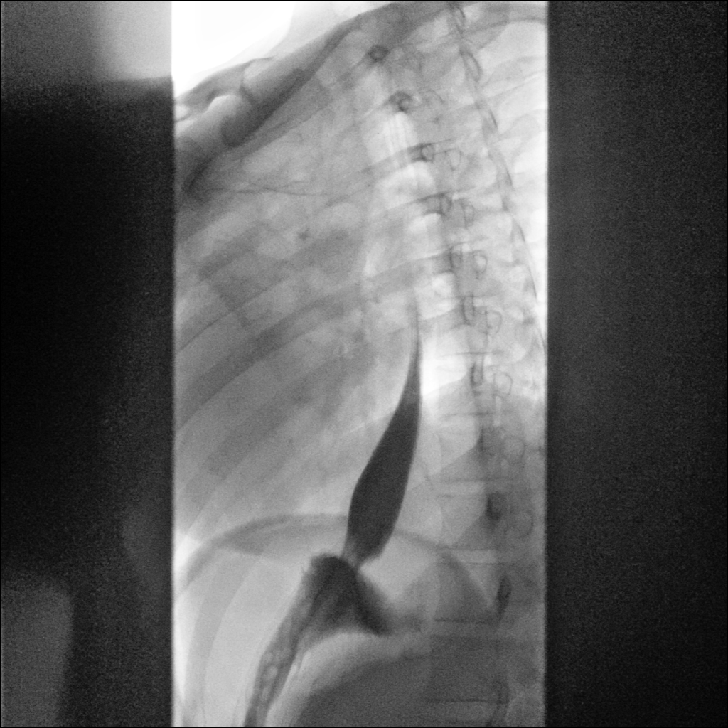
[im 6/10]
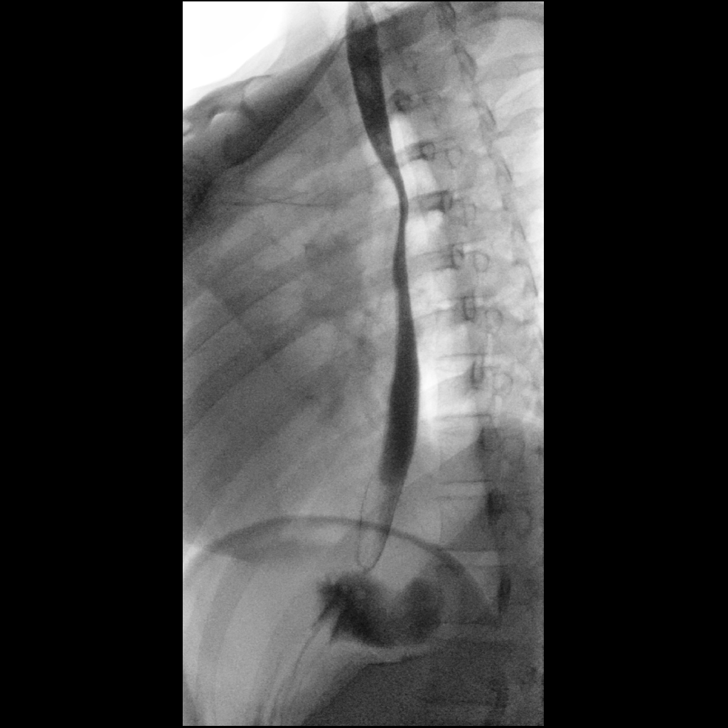
[im 7/10]
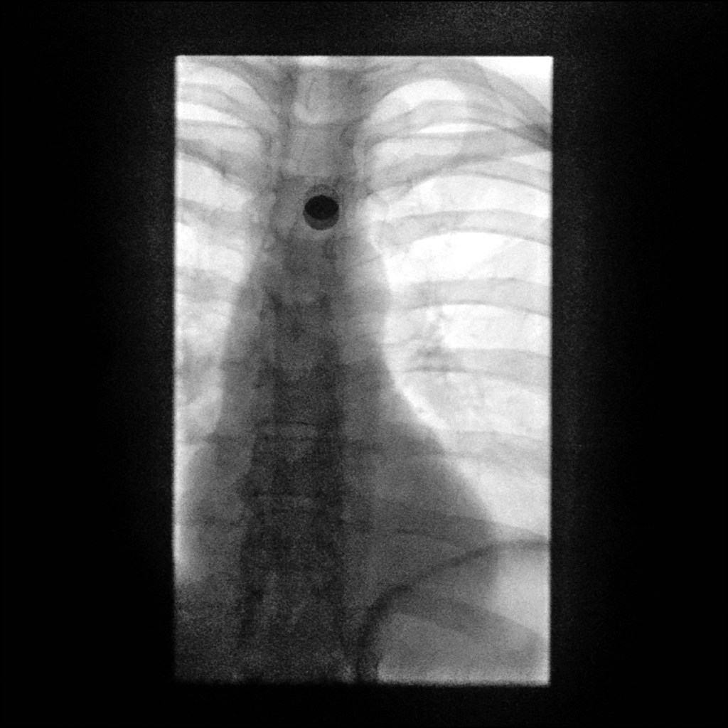
[im 8/10]
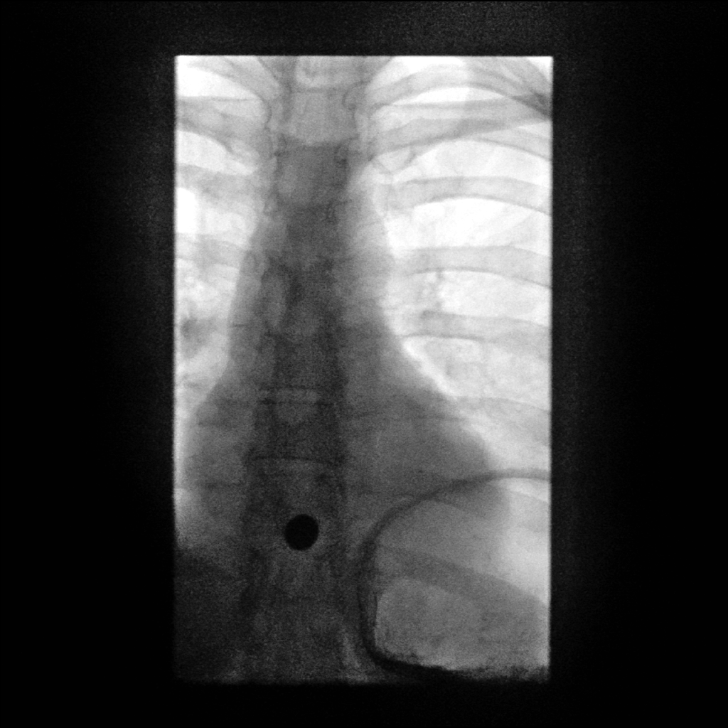
[im 9/10]
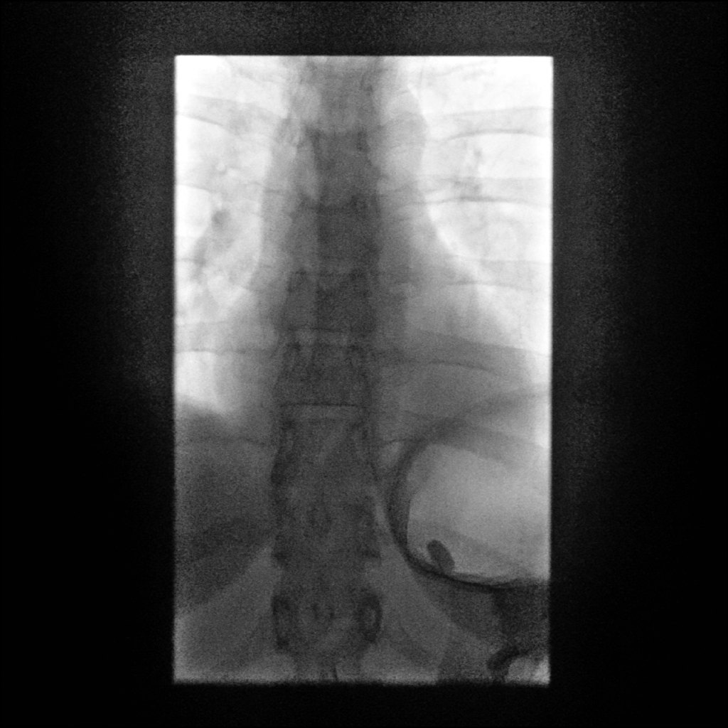
[im 10/10]
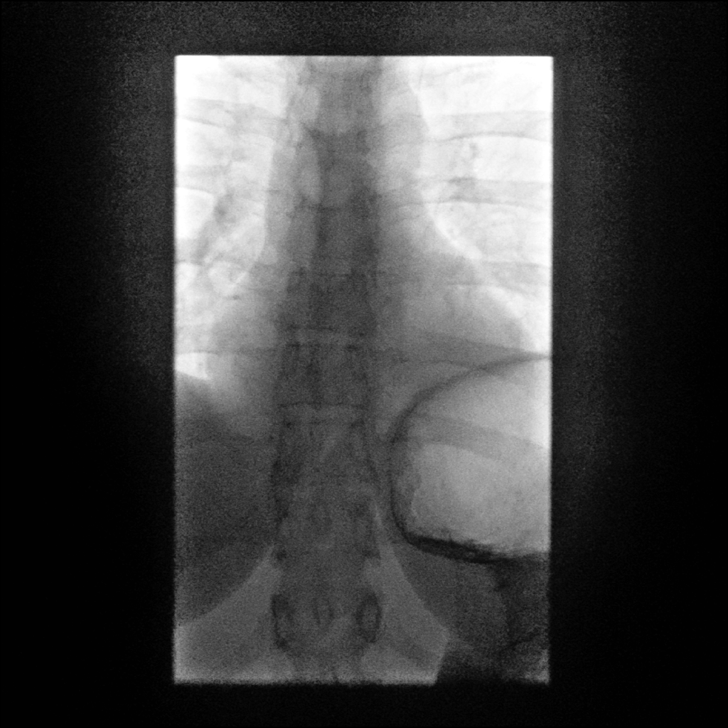

[10 of 10 positions shown; findings below may reference images not displayed]

FINDINGS: There was an error during image storage that resulted in some of the
images not being saved.

The esophageal motility is normal. There is no evidence of
stricture, mass or ulceration. Rapid sequence imaging of the pharynx
in the AP and lateral projections demonstrates no mucosal
abnormalities. There was no laryngeal penetration.

No gastroesophageal reflux was elicited with the water siphon test.
At the conclusion of the study, a 13 mm barium tablet was
administered. This passed without delay into the stomach.
IMPRESSION: Normal esophagram.  No explanation for dysphagia identified.

## 2021-12-04 ENCOUNTER — Encounter: Payer: Self-pay | Admitting: Sports Medicine

## 2021-12-05 ENCOUNTER — Other Ambulatory Visit: Payer: Self-pay

## 2021-12-05 DIAGNOSIS — E669 Obesity, unspecified: Secondary | ICD-10-CM

## 2021-12-05 DIAGNOSIS — R635 Abnormal weight gain: Secondary | ICD-10-CM

## 2021-12-23 ENCOUNTER — Other Ambulatory Visit: Payer: Self-pay

## 2021-12-23 ENCOUNTER — Encounter: Payer: Self-pay | Admitting: Sports Medicine

## 2021-12-23 DIAGNOSIS — D4709 Other mast cell neoplasms of uncertain behavior: Secondary | ICD-10-CM

## 2021-12-23 DIAGNOSIS — K30 Functional dyspepsia: Secondary | ICD-10-CM

## 2021-12-23 MED ORDER — CIMETIDINE 400 MG PO TABS: 400.0000 mg | ORAL_TABLET | Freq: Two times a day (BID) | ORAL | 3 refills | Status: DC

## 2022-01-06 ENCOUNTER — Encounter: Payer: Self-pay | Admitting: Sports Medicine

## 2022-01-07 ENCOUNTER — Other Ambulatory Visit: Payer: Self-pay | Admitting: Sports Medicine

## 2022-02-07 ENCOUNTER — Other Ambulatory Visit: Payer: Self-pay | Admitting: Sports Medicine

## 2022-02-07 MED ORDER — MONTELUKAST SODIUM 10 MG PO TABS
10.0000 mg | ORAL_TABLET | Freq: Every day | ORAL | 0 refills | Status: DC
Start: 2022-02-07 — End: 2022-03-10

## 2022-02-20 ENCOUNTER — Encounter: Payer: Self-pay | Admitting: Sports Medicine

## 2022-02-21 ENCOUNTER — Other Ambulatory Visit: Payer: Self-pay | Admitting: Sports Medicine

## 2022-02-21 MED ORDER — TRAMADOL HCL 50 MG PO TABS: ORAL_TABLET | ORAL | 3 refills | Status: DC

## 2022-03-10 ENCOUNTER — Other Ambulatory Visit: Payer: Self-pay | Admitting: *Deleted

## 2022-03-10 ENCOUNTER — Encounter: Payer: Self-pay | Admitting: Sports Medicine

## 2022-03-10 MED ORDER — MONTELUKAST SODIUM 10 MG PO TABS
10.0000 mg | ORAL_TABLET | Freq: Every day | ORAL | 0 refills | Status: DC
Start: 2022-03-10 — End: 2022-04-14

## 2022-04-13 ENCOUNTER — Encounter: Payer: Self-pay | Admitting: Sports Medicine

## 2022-04-14 ENCOUNTER — Other Ambulatory Visit: Payer: Self-pay | Admitting: *Deleted

## 2022-04-14 DIAGNOSIS — K30 Functional dyspepsia: Secondary | ICD-10-CM

## 2022-04-14 DIAGNOSIS — D4709 Other mast cell neoplasms of uncertain behavior: Secondary | ICD-10-CM

## 2022-04-14 MED ORDER — MONTELUKAST SODIUM 10 MG PO TABS: 10.0000 mg | ORAL_TABLET | Freq: Every day | ORAL | 1 refills | Status: DC

## 2022-04-14 MED ORDER — CIMETIDINE 400 MG PO TABS: 400.0000 mg | ORAL_TABLET | Freq: Two times a day (BID) | ORAL | 3 refills | Status: DC

## 2022-05-13 ENCOUNTER — Encounter: Payer: Self-pay | Admitting: Internal Medicine

## 2022-05-16 ENCOUNTER — Encounter: Payer: Self-pay | Admitting: Internal Medicine

## 2022-06-10 ENCOUNTER — Other Ambulatory Visit: Payer: Self-pay | Admitting: Sports Medicine

## 2022-06-16 ENCOUNTER — Encounter: Payer: Self-pay | Admitting: Sports Medicine

## 2022-06-17 ENCOUNTER — Other Ambulatory Visit: Payer: Self-pay | Admitting: Sports Medicine

## 2022-06-17 MED ORDER — TRAMADOL HCL 50 MG PO TABS: ORAL_TABLET | ORAL | 3 refills | Status: DC

## 2022-07-15 ENCOUNTER — Encounter: Payer: Self-pay | Admitting: Internal Medicine

## 2022-07-15 ENCOUNTER — Ambulatory Visit: Payer: Commercial Managed Care - HMO | Attending: Internal Medicine | Admitting: Internal Medicine

## 2022-07-15 VITALS — BP 104/65 | HR 78 | Ht 66.0 in

## 2022-07-15 DIAGNOSIS — G901 Familial dysautonomia [Riley-Day]: Secondary | ICD-10-CM | POA: Diagnosis not present

## 2022-07-15 MED ORDER — PINDOLOL 5 MG PO TABS: 5.0000 mg | ORAL_TABLET | Freq: Two times a day (BID) | ORAL | 3 refills | Status: DC

## 2022-07-15 NOTE — Patient Instructions (Signed)
Medication Instructions:  Your physician recommends that you continue on your current medications as directed. Please refer to the Current Medication list given to you today.  *If you need a refill on your cardiac medications before your next appointment, please call your pharmacy*   Lab Work: None ordered.  If you have labs (blood work) drawn today and your tests are completely normal, you will receive your results only by: MyChart Message (if you have MyChart) OR A paper copy in the mail If you have any lab test that is abnormal or we need to change your treatment, we will call you to review the results.   Testing/Procedures: None ordered.    Follow-Up: At [facility], you and your health needs are our priority.  As part of our continuing mission to provide you with exceptional heart care, we have created designated Provider Care Teams.  These Care Teams include your primary Cardiologist (physician) and Advanced Practice Providers (APPs -  Physician Assistants and Nurse Practitioners) who all work together to provide you with the care you need, when you need it.  We recommend signing up for the patient portal called "MyChart".  Sign up information is provided on this After Visit Summary.  MyChart is used to connect with patients for Virtual Visits (Telemedicine).  Patients are able to view lab/test results, encounter notes, upcoming appointments, etc.  Non-urgent messages can be sent to your provider as well.   To learn more about what you can do with MyChart, go to [email or web address].    Your next appointment:   12 months with Dr Klein  Important Information About Sugar       

## 2022-07-15 NOTE — Progress Notes (Signed)
Patient Care Team: Glenis Smoker, MD as PCP - General (Family Medicine) Sueanne Margarita, MD as PCP - Cardiology (Cardiology)   HPI  Betty Lewis is a 37 y.o. female seen in follow-up for orthostatic intolerance without quite making the criteria for POTS in the setting of joint laxity/EDS syndrome.  Also has struggled with hip pain aggravating sleep this has been better but can be sporadic  Is also struggled with cyclical weights where she is losing and gaining 40-60 pounds for reasons that she does not understand.  She has seen many people.  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 Oneida Alar to great effect  Overall has been doing pretty well.  Significant amount of fatigue which has been ameliorated by the introduction of Vyvanse which was introduced as a stimulant and appetite suppressant  Pindolol has been very helpful for her       Records and Results Reviewed   Past Medical History:  Diagnosis Date   Bulging lumbar disc    X 2   PONV (postoperative nausea and vomiting)     Past Surgical History:  Procedure Laterality Date   HIP ARTHROSCOPY Left 07/28/2019   HYSTEROSCOPY N/A 11/06/2017   Procedure: HYSTEROSCOPY;  Surgeon: Janyth Pupa, DO;  Location: [facility];  Service: Gynecology;  Laterality: N/A;   IUD REMOVAL N/A 11/06/2017   Procedure: INTRAUTERINE DEVICE (IUD) REMOVAL;  Surgeon: Janyth Pupa, DO;  Location: [facility];  Service: Gynecology;  Laterality: N/A;   LEEP  2013   TONSILLECTOMY  2005   WISDOM TOOTH EXTRACTION  2004    Current Meds  Medication Sig   AVIANE 0.1-20 MG-MCG tablet Take 1 tablet by mouth daily.   cetirizine (ZYRTEC) 10 MG tablet Take 10 mg by mouth daily. In the morning   cimetidine (TAGAMET) 400 MG tablet Take 1 tablet (400 mg total) by mouth 2 (two) times daily.   montelukast (SINGULAIR) 10 MG tablet TAKE 1  TABLET BY MOUTH AT BEDTIME   traMADol (ULTRAM) 50 MG tablet Take 1-2 tabs every 12 hours as needed for pain   [DISCONTINUED] pindolol (VISKEN) 5 MG tablet Take 1 tablet (5 mg total) by mouth 2 (two) times daily.    Allergies  Allergen Reactions   Codeine     MAKES HER DIZZY AND DISORIENTED      Review of Systems negative except from HPI and PMH  Physical Exam BP 104/65   Pulse 78   Ht '5\' 6"'$  (1.676 m)   SpO2 98%   BMI 30.99 kg/m  Well developed and nourished in no acute distress HENT normal Neck supple with JVP-  flat  Clear Regular rate and rhythm, no murmurs or gallops Abd-soft with active BS No Clubbing cyanosis edema Skin-warm and dry A & Oriented  Grossly normal sensory and motor function  ECG    CrCl cannot be calculated (Patient's most recent lab result is older than the maximum 21 days allowed.).   Assessment and  Plan  Dysautonomia with criteria mostly for orthostatic intolerance   Joint laxity syndrome   Hip pain impairing sleep   Stress less     Overall doing much better from a POTS point of view.  Her fatigue seems also to be ameliorated by the introduction of Vyvanse  Her orthostatics today were strikingly abnormal but her orthostatic symptoms in general have been minimal  So  we will make no medication adjustments today  Gave her information regarding [email or web address] physical therapists have special expertise with joint laxity

## 2022-08-11 ENCOUNTER — Other Ambulatory Visit: Payer: Self-pay | Admitting: Sports Medicine

## 2022-08-11 DIAGNOSIS — K30 Functional dyspepsia: Secondary | ICD-10-CM

## 2022-08-11 DIAGNOSIS — D4709 Other mast cell neoplasms of uncertain behavior: Secondary | ICD-10-CM

## 2022-09-05 ENCOUNTER — Other Ambulatory Visit: Payer: Self-pay | Admitting: Sports Medicine

## 2022-09-11 ENCOUNTER — Other Ambulatory Visit: Payer: Self-pay | Admitting: Sports Medicine

## 2022-09-11 DIAGNOSIS — D4709 Other mast cell neoplasms of uncertain behavior: Secondary | ICD-10-CM

## 2022-09-11 DIAGNOSIS — K30 Functional dyspepsia: Secondary | ICD-10-CM

## 2022-09-12 ENCOUNTER — Other Ambulatory Visit: Payer: Self-pay

## 2022-09-12 MED ORDER — MONTELUKAST SODIUM 10 MG PO TABS: 10.0000 mg | ORAL_TABLET | Freq: Every day | ORAL | 2 refills | Status: DC

## 2022-10-04 ENCOUNTER — Other Ambulatory Visit: Payer: Self-pay | Admitting: Sports Medicine

## 2022-10-07 ENCOUNTER — Other Ambulatory Visit: Payer: Self-pay | Admitting: Sports Medicine

## 2022-10-07 MED ORDER — TRAMADOL HCL 50 MG PO TABS: ORAL_TABLET | ORAL | 3 refills | Status: DC

## 2022-10-14 ENCOUNTER — Ambulatory Visit: Payer: Commercial Managed Care - HMO | Admitting: Sports Medicine

## 2022-10-14 VITALS — BP 108/78 | Ht 66.0 in | Wt 168.0 lb

## 2022-10-14 DIAGNOSIS — E669 Obesity, unspecified: Secondary | ICD-10-CM | POA: Diagnosis not present

## 2022-10-14 DIAGNOSIS — D4709 Other mast cell neoplasms of uncertain behavior: Secondary | ICD-10-CM | POA: Diagnosis not present

## 2022-10-14 DIAGNOSIS — Z6830 Body mass index (BMI) 30.0-30.9, adult: Secondary | ICD-10-CM

## 2022-10-14 DIAGNOSIS — G90A Postural orthostatic tachycardia syndrome (POTS): Secondary | ICD-10-CM

## 2022-10-14 DIAGNOSIS — Q7962 Hypermobile Ehlers-Danlos syndrome: Secondary | ICD-10-CM

## 2022-10-14 DIAGNOSIS — L509 Urticaria, unspecified: Secondary | ICD-10-CM | POA: Diagnosis not present

## 2022-10-14 NOTE — Assessment & Plan Note (Signed)
With her muscle fatigues and aches I think the thoughts about mitochondrial dysfunction are reasonable.  With this in mind I suggested trying coenzyme Q 400 mg a day.  I sent her information about the various supplements that are used with mitochondrial dysfunction.  We can consider whether she will have more evaluation or should try more supplements to see if they benefit her overall fatigue and muscle pain symptoms.  She returns to see me periodically for this type of assessment

## 2022-10-14 NOTE — Assessment & Plan Note (Signed)
I agree that we would like to get her off of Singulair.  We will refer her to an allergist for other suggestions but I would assume injectable Xolair might be an option

## 2022-10-14 NOTE — Assessment & Plan Note (Signed)
This seems well-controlled at the time and will not suggest any changes

## 2022-10-14 NOTE — Progress Notes (Signed)
Complaint: Ehlers-Danlos patient with chronic myalgias and joint pain here for discussion of medication and treatment options  Patient is well-educated with a PhD in psychology and healthcare She has been looking into causes for some of her pain and physical challenges. She had a dramatic increase in weight gain without a clear provocative reason recently.  She was finally seen by Dr. Briscoe Deutscher and after settling in on zepbound she has lost a total of 20 pounds from the 60 she gained.  While on Ozempic she had an increase in her muscular pain.  She had discussed with Dr. Juleen China about the possibility of having a mitochondrial disorder.  She has had low energy and easy fatigue.  The addition of Vyvanse 25 or 50 mg as needed has significantly improved those symptoms.  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  However she is concerned because she has occasionally had suicidal ideation while taking Singulair and would like to get off this medication.  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

## 2022-10-14 NOTE — Assessment & Plan Note (Signed)
Good improvement with the treatment program prescribed by Dr. Juleen China.  She currently is taking Zepbound

## 2022-10-27 ENCOUNTER — Encounter: Payer: Self-pay | Admitting: *Deleted

## 2022-11-20 ENCOUNTER — Encounter: Payer: Self-pay | Admitting: Allergy & Immunology

## 2022-11-20 ENCOUNTER — Ambulatory Visit (INDEPENDENT_AMBULATORY_CARE_PROVIDER_SITE_OTHER): Payer: Commercial Managed Care - HMO | Admitting: Allergy & Immunology

## 2022-11-20 ENCOUNTER — Ambulatory Visit (INDEPENDENT_AMBULATORY_CARE_PROVIDER_SITE_OTHER): Payer: Commercial Managed Care - HMO | Admitting: Sports Medicine

## 2022-11-20 ENCOUNTER — Other Ambulatory Visit: Payer: Self-pay

## 2022-11-20 VITALS — BP 118/70 | HR 89 | Temp 98.7°F | Resp 18 | Ht 65.35 in | Wt 169.8 lb

## 2022-11-20 VITALS — BP 100/64 | Ht 66.0 in

## 2022-11-20 DIAGNOSIS — L508 Other urticaria: Secondary | ICD-10-CM

## 2022-11-20 DIAGNOSIS — D8940 Mast cell activation, unspecified: Secondary | ICD-10-CM | POA: Diagnosis not present

## 2022-11-20 DIAGNOSIS — M519 Unspecified thoracic, thoracolumbar and lumbosacral intervertebral disc disorder: Secondary | ICD-10-CM | POA: Diagnosis not present

## 2022-11-20 DIAGNOSIS — G90A Postural orthostatic tachycardia syndrome (POTS): Secondary | ICD-10-CM

## 2022-11-20 DIAGNOSIS — T63481D Toxic effect of venom of other arthropod, accidental (unintentional), subsequent encounter: Secondary | ICD-10-CM

## 2022-11-20 DIAGNOSIS — M545 Low back pain, unspecified: Secondary | ICD-10-CM | POA: Diagnosis not present

## 2022-11-20 DIAGNOSIS — J3089 Other allergic rhinitis: Secondary | ICD-10-CM

## 2022-11-20 DIAGNOSIS — J302 Other seasonal allergic rhinitis: Secondary | ICD-10-CM | POA: Diagnosis not present

## 2022-11-20 DIAGNOSIS — Q7962 Hypermobile Ehlers-Danlos syndrome: Secondary | ICD-10-CM

## 2022-11-20 MED ORDER — CYCLOBENZAPRINE HCL 10 MG PO TABS
10.0000 mg | ORAL_TABLET | Freq: Three times a day (TID) | ORAL | 0 refills | Status: DC
Start: 2022-11-20 — End: 2022-12-29

## 2022-11-20 MED ORDER — METHYLPREDNISOLONE ACETATE 40 MG/ML IJ SUSP
80.0000 mg | Freq: Once | INTRAMUSCULAR | Status: AC
  Administered 2022-11-20: 80 mg via INTRAMUSCULAR

## 2022-11-20 NOTE — Patient Instructions (Signed)
Follow-up in 2 weeks

## 2022-11-20 NOTE — Assessment & Plan Note (Signed)
Clinically this patient likely has either a bulging or herniated disc She has had bulging disks in the past We will give her 80 mg of Solu-Medrol I'm Flexeril 10 mg which she can use up to 3 times a day Short walks and easy motion exercises for her low back Recheck her in 2 weeks  If she is not improving we will need to do imaging of her back With her Ehlers-Danlos it is very likely that she has some significant bulging of the disc

## 2022-11-20 NOTE — Progress Notes (Signed)
NEW PATIENT  Date of Service/Encounter:  11/20/22  Consult requested by: Nestor Ramp, MD   Assessment:   Seasonal and perennial allergic rhinitis  POTS (postural orthostatic tachycardia syndrome)  Ehlers-Danlos syndrome type III  Mast cell activation syndrome   Chronic urticaria  Insect sting allergy  Plan/Recommendations:   1. Seasonal and perennial allergic rhinitis - Testing today showed: grasses, ragweed, weeds, trees, indoor molds, outdoor molds, dust mites, cat, and dog - Copy of test results provided.  - Avoidance measures provided. - Stop taking: Singulair - Continue with: Zyrtec (cetirizine) 10mg  tablet TWICE daily and cimetidine twice daily (NOTE HIGHER DOSING OF ZYRTEC)  - You can use an extra dose of the antihistamine, if needed, for breakthrough symptoms.  - Consider nasal saline rinses 1-2 times daily to remove allergens from the nasal cavities as well as help with mucous clearance (this is especially helpful to do before the nasal sprays are given) - Consider allergy shots as a means of long-term control. - Allergy shots "re-train" and "reset" the immune system to ignore environmental allergens and decrease the resulting immune response to those allergens (sneezing, itchy watery eyes, runny nose, nasal congestion, etc).    - Allergy shots improve symptoms in 75-85% of patients.  - We can discuss more at the next appointment if the medications are not working for you.  2. 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  - We are getting some baseline labs for mast cell activation syndrome as well as labs to rule out serious causes of hives and swelling.  - Call us during flares so we can get some additional testing if needed (these particular tests are only elevated during flares and can help with the diagnosis).  - We will call you in 1-2 weeks with  the results of the testing.  - STOP the Singulair as above. - Continue with the Zyrtec TWICE DAILY and cimetidine twice daily. - We could add on cromolyn for improved control of MCAS symptoms, but let's try the higher dose Zyrtec first. - Information on Xolair provided today. - This is relatively easy to get approved if you wanted to go that direction.  - Testing was slightly reactive to hazelnut, but since you are eating Nutella without a problem, this is likely a false positive. - We are getting blood work to confirm this.   3. Stinging insect allergy - We are checking for a stinging insect allergy given your severe reaction. Audry Riles training provided today. - Anaphylaxis management plan provided.  4. Return in about 2 months (around 01/20/2023). You can have the follow up appointment with Dr. Dellis Anes only.   This note in its entirety was forwarded to the Provider who requested this consultation.  Subjective:   Betty Lewis is a 37 y.o. female presenting today for evaluation of  Chief Complaint  Patient presents with   Other    Tested for allergies as a teen - was referred for Mast Cell and has been taking medications for it but has not been fully diagnosed     Betty Lewis has a history of the following: Patient Active Problem List   Diagnosis Date Noted   Acute low back pain with disc symptoms, duration less than 6 weeks 11/20/2022   Obesity 10/22/2021   Acute bronchitis 09/24/2021   Thyroid function study abnormality 04/23/2021   Weight gain 04/10/2021  Hives 11/13/2020   Orthostatic intolerance 05/28/2020   Dysautonomia (HCC) 05/28/2020   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  Shann Medal was referred by Nestor Ramp, MD.     Betty Lewis is a 37 y.o. female presenting for an  evaluation of allergic reactions .  She has a history of dysautonomia.  She sees Dr. Graciela Husbands with cardiology.  Her last appointment with him was January 2024.  She has quite a bit of fatigue which improved with introduction of Vyvanse.  She also is on pindolol for her POTS. POTS was diagnosed around the same time as EDS in 2021.   She has a diagnosis of mast cell activation syndrome.  She has been managed in the past by Dr. Enid Baas.  His last visit with her was in early April 2024.  She has been on Tagamet as well as Zyrtec and Singulair for management.  She was started on co-Q10 400 mg once a day for concern for mitochondrial dysfunction. She has a confirmed diagnosis of Ehlers-Danlos syndrome. Formal diagnosis was in 2021. She sees Dr. Earlene Plater as well.  Two or three years ago, she was having some intense itching This is when she started to get tested for mast cell issues. She is now on Tagament, Zyrtec, and montelukast. She did have some side effects of the montelukast. She has constant nausea and fatigue. She was on cetirizine long term. Then the Tagament twice daily helped a bit more. Then Singulair was added with good results. She has never been on cromolyn. She has never tried to push the cetirizine to twice daily. She has not noticed any particular triggers for her episodes. She is severely allergic to dusts and pollen. But she is itchy constantly from her fingers to her ears and toes. She has a bright red face and becomes got. 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 She has more constipation rather than diarrhea. She has random episodes of diarrhea. She did have indigestion that improved with the Tagament. She deos not tend to run fevers at all.   She is having a lot of weight fluctuations with a gain of 60 pounds in the last year. She is on the Zepbound now and has lost a lot of weight with this. This fluctuations in weight are all the place.    Allergic Rhinitis Symptom History: She has sneezing and itchy watery eyes throughout the year. She has issues with pollen. She does not use a nose s[pray at all. She has had ear issues and sinus issues in the past. She has not had any sinus surgeries. She had chronic sinusitis in high school and has had various bouts of severe infections, but no surgery.  She had environmental allergy testing that showed positives to grasses, weeds, dust mites, cats, Aspergillus, and other molds. She did have testing done when she was in high school and she was positive to a number of items  Food Allergy Symptom History: She is allergic to dairy with a reaction including bloated. She tolerates lactose free milk without a problem.  Her testing was done in 2002 that showed positives to pecans, rye, string beans, green beans, corn, cabbage, tomatoes, white potatoes, onions, greens,   Skin Symptom History: She does have KP on her legs. She has  two of them - [facility] [place] and Dr. Wallace Cullens at [facility]. She did have a biopsy of a skin tag.  She sees [facility] [place] very rarely. She has psoriasis and she is not on anything at the moment. It was worse in high school, but has largely improved over time.   Otherwise, there is no history of other atopic diseases, including drug allergies, stinging insect allergies, or contact dermatitis. There is no significant infectious history. Vaccinations are up to date.    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 If you have any questions, ask your nurse or doctor.          Aviane 0.1-20 MG-MCG tablet Generic drug: levonorgestrel-ethinyl estradiol Take 1 tablet by mouth daily.   cetirizine 10 MG tablet Commonly known as: ZYRTEC Take 10 mg by mouth daily. In the morning   cimetidine 400 MG tablet Commonly known as: TAGAMET Take 1 tablet by mouth twice daily   co-enzyme Q-10 30 MG capsule Take 30 mg by mouth daily.   cyclobenzaprine 10 MG tablet Commonly known as: FLEXERIL Take 1 tablet (10 mg total) by mouth 3 (three) times daily. Started by: Enid Baas, MD   lisdexamfetamine 20 MG capsule Commonly known as: VYVANSE Take 20 mg by mouth daily as needed.   montelukast 10 MG tablet Commonly known as: SINGULAIR Take 1 tablet (10 mg total) by mouth at bedtime.   pindolol 5 MG tablet Commonly known as: VISKEN Take 1 tablet (5 mg total) by mouth 2 (two) times daily.   traMADol 50 MG tablet Commonly known as: ULTRAM Take 1-2 tabs every 12 hours as needed for pain   Zepbound 15 MG/0.5ML Pen Generic drug: tirzepatide Inject 15 mg into the skin once a week.        Birth History: non-contributory  Developmental History: non-contributory  Past Surgical History: Past Surgical History:  Procedure Laterality Date   HIP ARTHROSCOPY Left 07/28/2019   HYSTEROSCOPY N/A 11/06/2017   Procedure: HYSTEROSCOPY;  Surgeon: Myna Hidalgo, DO;  Location: [facility];  Service: Gynecology;  Laterality: N/A;   IUD REMOVAL N/A 11/06/2017   Procedure: INTRAUTERINE DEVICE (IUD) REMOVAL;  Surgeon: Myna Hidalgo, DO;  Location: [facility];  Service: Gynecology;  Laterality: N/A;   LEEP  2013   TONSILLECTOMY  2005   WISDOM TOOTH EXTRACTION  2004     Family History: Family History  Problem Relation Age  of Onset   Heart disease Mother    CAD Mother    Diabetes Mother      Social History: Athalie lives at home with her family.  She lives in a house that was built in the 1980s.  There is laminate flooring throughout the home.  She has  carpeting in her bedroom.  There is central heating and cooling.  There are 2 dogs inside of the house.  There are dust mite covers on the bed as well as the pillows.  There is no tobacco exposure.  She currently works from home for the past 2 years.  There is no fume, chemical, or dust exposure.  [place]  There is no exposure at all to any tobacco in any form. She does Corporate investment banker.     Review of Systems  Constitutional: Negative.  Negative for fever, malaise/fatigue and weight loss.  HENT: Negative.  Negative for congestion, ear discharge, ear pain and sinus pain.        Positive for postnasal drip.  Positive for throat clearing.  Eyes:  Negative for pain, discharge and redness.  Respiratory:  Negative for cough, sputum production, shortness of breath and wheezing.   Cardiovascular: Negative.  Negative for chest pain and palpitations.  Gastrointestinal:  Positive for abdominal pain, constipation and diarrhea. Negative for heartburn, nausea and vomiting.  Skin:  Positive for rash. Negative for itching.  Neurological:  Negative for dizziness and headaches.  Endo/Heme/Allergies:  Negative for environmental allergies. Does not bruise/bleed easily.       Objective:   Blood pressure 118/70, pulse 89, temperature 98.7 F (37.1 C), resp. rate 18, height 5' 5.35" (1.66 m), weight 169 lb 12.8 oz (77 kg), SpO2 98 %. Body mass index is 27.95 kg/m.     Physical Exam Vitals reviewed.  Constitutional:      Appearance: She is well-developed.  HENT:     Head: Normocephalic and atraumatic.     Right Ear: Tympanic membrane, ear canal and external ear normal. No drainage, swelling or tenderness. Tympanic membrane is not injected, scarred,  erythematous, retracted or bulging.     Left Ear: Tympanic membrane, ear canal and external ear normal. No drainage, swelling or tenderness. Tympanic membrane is not injected, scarred, erythematous, retracted or bulging.     Nose: No nasal deformity, septal deviation, mucosal edema or rhinorrhea.     Right Turbinates: Enlarged, swollen and pale.     Left Turbinates: Enlarged, swollen and pale.     Right Sinus: No maxillary sinus tenderness or frontal sinus tenderness.     Left Sinus: No maxillary sinus tenderness or frontal sinus tenderness.     Mouth/Throat:     Lips: Pink.     Mouth: Mucous membranes are moist. Mucous membranes are not pale and not dry.     Pharynx: Uvula midline.     Comments: Cobblestoning in the posterior oropharynx. Eyes:     General: Lids are normal.        Right eye: No discharge.        Left eye: No discharge.     Conjunctiva/sclera:     Right eye: Right conjunctiva is injected. Chemosis present.     Left eye: Left conjunctiva is injected. Chemosis present.     Pupils: Pupils are equal, round, and reactive to light.  Cardiovascular:     Rate and Rhythm: Normal rate and regular rhythm.     Heart sounds: Normal heart sounds.  Pulmonary:     Effort: Pulmonary effort is normal. No tachypnea, accessory muscle usage or respiratory distress.     Breath sounds: Normal breath sounds. No wheezing, rhonchi or rales.  Chest:     Chest wall: No tenderness.  Abdominal:     Tenderness: There is no abdominal tenderness. There is  no guarding or rebound.  Lymphadenopathy:     Head:     Right side of head: No submandibular, tonsillar or occipital adenopathy.     Left side of head: No submandibular, tonsillar or occipital adenopathy.     Cervical: No cervical adenopathy.  Skin:    General: Skin is warm.     Capillary Refill: Capillary refill takes less than 2 seconds.     Coloration: Skin is not pale.     Findings: Rash present. No abrasion, erythema or petechiae. Rash is  urticarial. Rash is not papular or vesicular.     Comments: Highly dermatographic skin.  Neurological:     Mental Status: She is alert.  Psychiatric:        Behavior: Behavior is cooperative.      Diagnostic studies:   Allergy Studies:      Airborne Adult Perc - 11/20/22 0958     Time Antigen Placed 1610    Allergen Manufacturer Waynette Buttery    Location Back    Number of Test 59    1. Control-Buffer 50% Glycerol Negative    2. Control-Histamine 1 mg/ml 2+    3. Albumin saline Negative    4. Bahia Negative    5. [place] 2+    6. Johnson Negative    7. Kentucky Blue 2+    8. Meadow Fescue Negative    9. Perennial Rye Negative    10. Sweet Vernal Negative    11. Timothy Negative    12. Cocklebur Negative    13. Burweed Marshelder Negative    14. Ragweed, short 3+    15. Ragweed, Giant 2+    16. Plantain,  English 2+    17. Lamb's Quarters 3+    18. Sheep Sorrell 2+    19. Rough Pigweed 4+    20. Marsh Elder, Rough 4+    21. Mugwort, Common Negative    22. Ash mix 2+    23. Birch mix 3+    24. Beech American 2+    25. Box, Elder 2+    26. Cedar, red 2+    27. Cottonwood, Eastern 3+    28. Elm mix 3+    29. Hickory 3+    30. Maple mix 3+    31. Oak, Guinea[place] mix Negative    32. Pecan Pollen Negative    33. Pine mix Negative    34. Sycamore Eastern 2+    35. Walnut, Black Pollen 3+    36. Alternaria alternata 2+    37. Cladosporium Herbarum 2+    38. Aspergillus mix Negative    39. Penicillium mix Negative    40. Bipolaris sorokiniana (Helminthosporium) Negative    41. Drechslera spicifera (Curvularia) Negative    42. Mucor plumbeus Negative    43. Fusarium moniliforme Negative    44. Aureobasidium pullulans (pullulara) 2+    45. Rhizopus oryzae 3+    46. Botrytis cinera Negative    47. Epicoccum nigrum Negative    48. Phoma betae Negative    49. Candida Albicans Negative    50. Trichophyton mentagrophytes Negative    51. Mite, D Farinae  5,000 AU/ml 3+    52.  Mite, D Pteronyssinus  5,000 AU/ml 3+    53. Cat Hair 10,000 BAU/ml 2+    54.  Dog Epithelia --   +/-   55. Mixed Feathers Negative    56. Horse Epithelia Negative    57. Cockroach, German Negative    58.  Mouse Negative    59. Tobacco Leaf Negative             Food Adult Perc - 11/20/22 1000     Time Antigen Placed 1308    Allergen Manufacturer Waynette Buttery    Location Back    Number of allergen test 17     Control-buffer 50% Glycerol Negative    Control-Histamine 1 mg/ml 2+    1. Peanut Negative    2. Soybean Negative    3. Wheat Negative    4. Sesame Negative    5. Milk, cow Negative    6. Egg White, Chicken Negative    7. Casein Negative    8. Shellfish Mix Negative    9. Fish Mix Negative    10. Cashew Negative    11. Pecan Food Negative    12. Walnut Food Negative    13. Almond Negative    14. Hazelnut --   7x10   15. [place] nut Negative    16. Coconut Negative    17. Pistachio Negative               Allergy testing results were read and interpreted by myself, documented by clinical staff.         Malachi Bonds, MD [facility]

## 2022-11-20 NOTE — Progress Notes (Signed)
Chief complaint low back pain with some radiation to the left leg and buttocks  Patient is one of my Ehlers-Danlos patients who has been doing pretty well She decided to try a rollerskating class She has been doing pretty well in this but did have a fall 4 weeks ago onto her left side She had some lateral pain but that pretty much resolved in a few days To weeks after that however she was stretching out to pick something up and had significantly worse pain That pain did radiate more to her buttocks and into the leg At first it was difficult to do much of anything because of pain Since that time it is improved but she still having daily pain primarily in the left side of the low back and the buttocks Sometimes down into the hip Sometimes she gets radiation down into the leg to the outside and down to the foot  She notes that flexion of her low back makes it worse Extension is very difficult She noted back pain with coughing or sneezing  Review of systems reveals no bowel or bladder changes No weakness or instability on her gait  Physical exam Patient does not appear to be in acute distress BP 100/64   Ht 5\' 6"  (1.676 m)   BMI 27.41 kg/m   Patient experiences pain on 30 degrees of lumbar flexion She gets pain with about 20 degrees of back extension Rotation is pain-free Bending to the right causes left-sided pain Bending to the left is nonpainful  She can do heel and toe walking without difficulty She can do crossover walking Strength testing from L4-S2 nerve root distribution is normal Reflexes are preserved but possibly a little less on the left side than the right Straight leg raises are not positive  Hip evaluation was unremarkable she still does have hypermobility as noted before SI joints were not truly tender with most the pain in the paraspinal muscles and down into the hip musculature

## 2022-11-20 NOTE — Patient Instructions (Addendum)
1. Seasonal and perennial allergic rhinitis - Testing today showed: grasses, ragweed, weeds, trees, indoor molds, outdoor molds, dust mites, cat, and dog - Copy of test results provided.  - Avoidance measures provided. - Stop taking: Singulair - Continue with: Zyrtec (cetirizine) 10mg  tablet TWICE daily and cimetidine twice daily (NOTE HIGHER DOSING OF ZYRTEC)  - You can use an extra dose of the antihistamine, if needed, for breakthrough symptoms.  - Consider nasal saline rinses 1-2 times daily to remove allergens from the nasal cavities as well as help with mucous clearance (this is especially helpful to do before the nasal sprays are given) - Consider allergy shots as a means of long-term control. - Allergy shots "re-train" and "reset" the immune system to ignore environmental allergens and decrease the resulting immune response to those allergens (sneezing, itchy watery eyes, runny nose, nasal congestion, etc).    - Allergy shots improve symptoms in 75-85% of patients.  - We can discuss more at the next appointment if the medications are not working for you.  2. 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  - We are getting some baseline labs for mast cell activation syndrome as well as labs to rule out serious causes of hives and swelling.  - Call us during flares so we can get some additional testing if needed (these particular tests are only elevated during flares and can help with the diagnosis).  - We will call you in 1-2 weeks with the results of the testing.  - STOP the Singulair as above. - Continue with the Zyrtec TWICE DAILY and cimetidine twice daily. - We could add on cromolyn for improved control of MCAS symptoms, but let's try the higher dose Zyrtec first. - Information on Xolair provided today. - This is relatively easy to get approved if you wanted to go that  direction.  - Testing was slightly reactive to hazelnut, but since you are eating Nutella without a problem, this is likely a false positive. - We are getting blood work to confirm this.   3. Stinging insect allergy - We are checking for a stinging insect allergy given your severe reaction. Audry Riles training provided today. - Anaphylaxis management plan provided.  4. Return in about 2 months (around 01/20/2023). You can have the follow up appointment with Dr. Dellis Anes only.    Please inform us of any Emergency Department visits, hospitalizations, or changes in symptoms. Call us before going to the ED for breathing or allergy symptoms since we might be able to fit you in for a sick visit. Feel free to contact us anytime with any questions, problems, or concerns.  It was a pleasure to meet you today!  Websites that have reliable patient information: 1. American Academy of Asthma, Allergy, and Immunology: www.aaaai.org 2. Food Allergy Research and Education (FARE): foodallergy.org 3. Mothers of Asthmatics: http://www.asthmacommunitynetwork.org 4. American College of Allergy, Asthma, and Immunology: www.acaai.org   COVID-19 Vaccine Information can be found at: [email or web address] For questions related to vaccine distribution or appointments, please email [email or web address] or call [phone].   We realize that you might be concerned about having an allergic reaction to the COVID19 vaccines. To help with that concern, WE ARE OFFERING THE COVID19 VACCINES IN OUR OFFICE! Ask the front desk for dates!     "Like" [place] on Facebook and Instagram for our latest updates!      A healthy democracy works best  when ALL voters participate! Make sure you are registered to vote! If you have moved or changed any of your contact information, you will need to get this updated before voting!  In some cases, you MAY be able to register to vote  online: [email or web address]        Airborne Adult Perc - 11/20/22 0958     Time Antigen Placed 1610    Allergen Manufacturer Waynette Buttery    Location Back    Number of Test 59    1. Control-Buffer 50% Glycerol Negative    2. Control-Histamine 1 mg/ml 2+    3. Albumin saline Negative    4. Bahia Negative    5. [place] 2+    6. Johnson Negative    7. [place] Blue 2+    8. Meadow Fescue Negative    9. Perennial Rye Negative    10. Sweet Vernal Negative    11. Timothy Negative    12. Cocklebur Negative    13. Burweed Marshelder Negative    14. Ragweed, short 3+    15. Ragweed, Giant 2+    16. Plantain,  English 2+    17. Lamb's Quarters 3+    18. Sheep Sorrell 2+    19. Rough Pigweed 4+    20. Marsh Elder, Rough 4+    21. Mugwort, Common Negative    22. Ash mix 2+    23. Birch mix 3+    24. Beech American 2+    25. Box, Elder 2+    26. Cedar, red 2+    27. Cottonwood, Eastern 3+    28. Elm mix 3+    29. Hickory 3+    30. Maple mix 3+    31. Oak, [place] mix Negative    32. Pecan Pollen Negative    33. Pine mix Negative    34. Sycamore Eastern 2+    35. Walnut, Black Pollen 3+    36. Alternaria alternata 2+    37. Cladosporium Herbarum 2+    38. Aspergillus mix Negative    39. Penicillium mix Negative    40. Bipolaris sorokiniana (Helminthosporium) Negative    41. Drechslera spicifera (Curvularia) Negative    42. Mucor plumbeus Negative    43. Fusarium moniliforme Negative    44. Aureobasidium pullulans (pullulara) 2+    45. Rhizopus oryzae 3+    46. Botrytis cinera Negative    47. Epicoccum nigrum Negative    48. Phoma betae Negative    49. Candida Albicans Negative    50. Trichophyton mentagrophytes Negative    51. Mite, D Farinae  5,000 AU/ml 3+    52. Mite, D Pteronyssinus  5,000 AU/ml 3+    53. Cat Hair 10,000 BAU/ml 2+    54.  Dog Epithelia --   +/-   55. Mixed Feathers Negative    56. Horse Epithelia Negative    57.  Cockroach, German Negative    58. Mouse Negative    59. Tobacco Leaf Negative             Food Adult Perc - 11/20/22 1000     Time Antigen Placed 9604    Allergen Manufacturer Waynette Buttery    Location Back    Number of allergen test 17     Control-buffer 50% Glycerol Negative    Control-Histamine 1 mg/ml 2+    1. Peanut Negative    2. Soybean Negative    3. Wheat Negative    4. Sesame Negative  5. Milk, cow Negative    6. Egg White, Chicken Negative    7. Casein Negative    8. Shellfish Mix Negative    9. Fish Mix Negative    10. Cashew Negative    11. Pecan Food Negative    12. Walnut Food Negative    13. Almond Negative    14. Hazelnut --   7x10   15. [place] nut Negative    16. Coconut Negative    17. Pistachio Negative             Reducing Pollen Exposure  The American Academy of Allergy, Asthma and Immunology suggests the following steps to reduce your exposure to pollen during allergy seasons.    Do not hang sheets or clothing out to dry; pollen may collect on these items. Do not mow lawns or spend time around freshly cut grass; mowing stirs up pollen. Keep windows closed at night.  Keep car windows closed while driving. Minimize morning activities outdoors, a time when pollen counts are usually at their highest. Stay indoors as much as possible when pollen counts or humidity is high and on windy days when pollen tends to remain in the air longer. Use air conditioning when possible.  Many air conditioners have filters that trap the pollen spores. Use a HEPA room air filter to remove pollen form the indoor air you breathe.  Control of Mold Allergen   Mold and fungi can grow on a variety of surfaces provided certain temperature and moisture conditions exist.  Outdoor molds grow on plants, decaying vegetation and soil.  The major outdoor mold, Alternaria and Cladosporium, are found in very high numbers during hot and dry conditions.  Generally, a late Summer - Fall  peak is seen for common outdoor fungal spores.  Rain will temporarily lower outdoor mold spore count, but counts rise rapidly when the rainy period ends.  The most important indoor molds are Aspergillus and Penicillium.  Dark, humid and poorly ventilated basements are ideal sites for mold growth.  The next most common sites of mold growth are the bathroom and the kitchen.  Outdoor (Seasonal) Mold Control  Positive outdoor molds via skin testing: Alternaria and Cladosporium  Use air conditioning and keep windows closed Avoid exposure to decaying vegetation. Avoid leaf raking. Avoid grain handling. Consider wearing a face mask if working in moldy areas.    Indoor (Perennial) Mold Control   Positive indoor molds via skin testing: Aureobasidium (Pullulara) and Rhizopus  Maintain humidity below 50%. Clean washable surfaces with 5% bleach solution. Remove sources e.g. contaminated carpets.    Control of Dust Mite Allergen    Dust mites play a major role in allergic asthma and rhinitis.  They occur in environments with high humidity wherever human skin is found.  Dust mites absorb humidity from the atmosphere (ie, they do not drink) and feed on organic matter (including shed human and animal skin).  Dust mites are a microscopic type of insect that you cannot see with the naked eye.  High levels of dust mites have been detected from mattresses, pillows, carpets, upholstered furniture, bed covers, clothes, soft toys and any woven material.  The principal allergen of the dust mite is found in its feces.  A gram of dust may contain 1,000 mites and 250,000 fecal particles.  Mite antigen is easily measured in the air during house cleaning activities.  Dust mites do not bite and do not cause harm to humans, other than by triggering allergies/asthma.  Ways to decrease your exposure to dust mites in your home:  Encase mattresses, box springs and pillows with a mite-impermeable barrier or cover    Wash sheets, blankets and drapes weekly in hot water (130 F) with detergent and dry them in a dryer on the hot setting.  Have the room cleaned frequently with a vacuum cleaner and a damp dust-mop.  For carpeting or rugs, vacuuming with a vacuum cleaner equipped with a high-efficiency particulate air (HEPA) filter.  The dust mite allergic individual should not be in a room which is being cleaned and should wait 1 hour after cleaning before going into the room. Do not sleep on upholstered furniture (eg, couches).   If possible removing carpeting, upholstered furniture and drapery from the home is ideal.  Horizontal blinds should be eliminated in the rooms where the person spends the most time (bedroom, study, television room).  Washable vinyl, roller-type shades are optimal. Remove all non-washable stuffed toys from the bedroom.  Wash stuffed toys weekly like sheets and blankets above.   Reduce indoor humidity to less than 50%.  Inexpensive humidity monitors can be purchased at most hardware stores.  Do not use a humidifier as can make the problem worse and are not recommended.   Control of Dog or Cat Allergen  Avoidance is the best way to manage a dog or cat allergy. If you have a dog or cat and are allergic to dog or cats, consider removing the dog or cat from the home. If you have a dog or cat but don't want to find it a new home, or if your family wants a pet even though someone in the household is allergic, here are some strategies that may help keep symptoms at bay:  Keep the pet out of your bedroom and restrict it to only a few rooms. Be advised that keeping the dog or cat in only one room will not limit the allergens to that room. Don't pet, hug or kiss the dog or cat; if you do, wash your hands with soap and water. High-efficiency particulate air (HEPA) cleaners run continuously in a bedroom or living room can reduce allergen levels over time. Regular use of a high-efficiency vacuum cleaner  or a central vacuum can reduce allergen levels. Giving your dog or cat a bath at least once a week can reduce airborne allergen.  Allergy Shots  Allergies are the result of a chain reaction that starts in the immune system. Your immune system controls how your body defends itself. For instance, if you have an allergy to pollen, your immune system identifies pollen as an invader or allergen. Your immune system overreacts by producing antibodies called Immunoglobulin E (IgE). These antibodies travel to cells that release chemicals, causing an allergic reaction.  The concept behind allergy immunotherapy, whether it is received in the form of shots or tablets, is that the immune system can be desensitized to specific allergens that trigger allergy symptoms. Although it requires time and patience, the payback can be long-term relief. Allergy injections contain a dilute solution of those substances that you are allergic to based upon your skin testing and allergy history.   How Do Allergy Shots Work?  Allergy shots work much like a vaccine. Your body responds to injected amounts of a particular allergen given in increasing doses, eventually developing a resistance and tolerance to it. Allergy shots can lead to decreased, minimal or no allergy symptoms.  There generally are two phases: build-up and maintenance. Build-up often  ranges from three to six months and involves receiving injections with increasing amounts of the allergens. The shots are typically given once or twice a week, though more rapid build-up schedules are sometimes used.  The maintenance phase begins when the most effective dose is reached. This dose is different for each person, depending on how allergic you are and your response to the build-up injections. Once the maintenance dose is reached, there are longer periods between injections, typically two to four weeks.  Occasionally doctors give cortisone-type shots that can temporarily  reduce allergy symptoms. These types of shots are different and should not be confused with allergy immunotherapy shots.  Who Can Be Treated with Allergy Shots?  Allergy shots may be a good treatment approach for people with allergic rhinitis (hay fever), allergic asthma, conjunctivitis (eye allergy) or stinging insect allergy.   Before deciding to begin allergy shots, you should consider:   The length of allergy season and the severity of your symptoms  Whether medications and/or changes to your environment can control your symptoms  Your desire to avoid long-term medication use  Time: allergy immunotherapy requires a major time commitment  Cost: may vary depending on your insurance coverage  Allergy shots for children age 60 and older are effective and often well tolerated. They might prevent the onset of new allergen sensitivities or the progression to asthma.  Allergy shots are not started on patients who are pregnant but can be continued on patients who become pregnant while receiving them. In some patients with other medical conditions or who take certain common medications, allergy shots may be of risk. It is important to mention other medications you talk to your allergist.   What are the two types of build-ups offered:   RUSH or Rapid Desensitization -- one day of injections lasting from 8:30-4:30pm, injections every 1 hour.  Approximately half of the build-up process is completed in that one day.  The following week, normal build-up is resumed, and this entails ~16 visits either weekly or twice weekly, until reaching your "maintenance dose" which is continued weekly until eventually getting spaced out to every month for a duration of 3 to 5 years. The regular build-up appointments are nurse visits where the injections are administered, followed by required monitoring for 30 minutes.    Traditional build-up -- weekly visits for 6 -12 months until reaching "maintenance dose", then  continue weekly until eventually spacing out to every 4 weeks as above. At these appointments, the injections are administered, followed by required monitoring for 30 minutes.     Either way is acceptable, and both are equally effective. With the rush protocol, the advantage is that less time is spent here for injections overall AND you would also reach maintenance dosing faster (which is when the clinical benefit starts to become more apparent). Not everyone is a candidate for rapid desensitization.   IF we proceed with the RUSH protocol, there are premedications which must be taken the day before and the day after the rush only (this includes antihistamines, steroids, and Singulair).  After the rush day, no prednisone or Singulair is required, and we just recommend antihistamines taken on your injection day.  What Is An Estimate of the Costs?  If you are interested in starting allergy injections, please check with your insurance company about your coverage for both allergy vial sets and allergy injections.  Please do so prior to making the appointment to start injections.  The following are CPT codes to give to  your insurance company. These are the amounts we BILL to the insurance company, but the amount YOU WILL PAY and WE RECEIVE IS SUBSTANTIALLY LESS and depends on the contracts we have with different insurance companies.   Amount Billed to Insurance One allergy vial set  CPT 95165   $ 1200     Two allergy vial set  CPT 95165   $ 2400     Three allergy vial set  CPT 95165   $ 3600     One injection   CPT 95115   $ 35  Two injections   CPT 95117   $ 40 RUSH (Rapid Desensitization) CPT 95180 x 8 hours $500/hour  Regarding the allergy injections, your co-pay may or may not apply with each injection, so please confirm this with your insurance company. When you start allergy injections, 1 or 2 sets of vials are made based on your allergies.  Not all patients can be on one set of vials. A set of  vials lasts 6 months to a year depending on how quickly you can proceed with your build-up of your allergy injections. Vials are personalized for each patient depending on their specific allergens.  How often are allergy injection given during the build-up period?   Injections are given at least weekly during the build-up period until your maintenance dose is achieved. Per the doctor's discretion, you may have the option of getting allergy injections two times per week during the build-up period. However, there must be at least 48 hours between injections. The build-up period is usually completed within 6-12 months depending on your ability to schedule injections and for adjustments for reactions. When maintenance dose is reached, your injection schedule is gradually changed to every two weeks and later to every three weeks. Injections will then continue every 4 weeks. Usually, injections are continued for a total of 3-5 years.   When Will I Feel Better?  Some may experience decreased allergy symptoms during the build-up phase. For others, it may take as long as 12 months on the maintenance dose. If there is no improvement after a year of maintenance, your allergist will discuss other treatment options with you.  If you aren't responding to allergy shots, it may be because there is not enough dose of the allergen in your vaccine or there are missing allergens that were not identified during your allergy testing. Other reasons could be that there are high levels of the allergen in your environment or major exposure to non-allergic triggers like tobacco smoke.  What Is the Length of Treatment?  Once the maintenance dose is reached, allergy shots are generally continued for three to five years. The decision to stop should be discussed with your allergist at that time. Some people may experience a permanent reduction of allergy symptoms. Others may relapse and a longer course of allergy shots can be  considered.  What Are the Possible Reactions?  The two types of adverse reactions that can occur with allergy shots are local and systemic. Common local reactions include very mild redness and swelling at the injection site, which can happen immediately or several hours after. Report a delayed reaction from your last injection. These include arm swelling or runny nose, watery eyes or cough that occurs within 12-24 hours after injection. A systemic reaction, which is less common, affects the entire body or a particular body system. They are usually mild and typically respond quickly to medications. Signs include increased allergy symptoms such as sneezing, a  stuffy nose or hives.   Rarely, a serious systemic reaction called anaphylaxis can develop. Symptoms include swelling in the throat, wheezing, a feeling of tightness in the chest, nausea or dizziness. Most serious systemic reactions develop within 30 minutes of allergy shots. This is why it is strongly recommended you wait in your doctor's office for 30 minutes after your injections. Your allergist is trained to watch for reactions, and his or her staff is trained and equipped with the proper medications to identify and treat them.   Report to the nurse immediately if you experience any of the following symptoms: swelling, itching or redness of the skin, hives, watery eyes/nose, breathing difficulty, excessive sneezing, coughing, stomach pain, diarrhea, or light headedness. These symptoms may occur within 15-20 minutes after injection and may require medication.   Who Should Administer Allergy Shots?  The preferred location for receiving shots is your prescribing allergist's office. Injections can sometimes be given at another facility where the physician and staff are trained to recognize and treat reactions, and have received instructions by your prescribing allergist.  What if I am late for an injection?   Injection dose will be adjusted  depending upon how many days or weeks you are late for your injection.   What if I am sick?   Please report any illness to the nurse before receiving injections. She may adjust your dose or postpone injections depending on your symptoms. If you have fever, flu, sinus infection or chest congestion it is best to postpone allergy injections until you are better. Never get an allergy injection if your asthma is causing you problems. If your symptoms persist, seek out medical care to get your health problem under control.  What If I am or Become Pregnant:  Women that become pregnant should schedule an appointment with [facility] before receiving any further allergy injections.

## 2022-11-21 LAB — CBC WITH DIFFERENTIAL
Eos: 1 %
Immature Granulocytes: 0 %
Lymphs: 33 %
MCV: 90 fL (ref 79–97)
WBC: 5.2 10*3/uL (ref 3.4–10.8)

## 2022-11-21 LAB — KIT (D816V) DIGITAL PCR

## 2022-11-21 LAB — CMP14+EGFR
Bilirubin Total: 0.8 mg/dL (ref 0.0–1.2)
Chloride: 104 mmol/L (ref 96–106)

## 2022-11-21 LAB — C-REACTIVE PROTEIN: CRP: 3 mg/L (ref 0–10)

## 2022-11-22 LAB — HYMENOPTERA VENOM ALLERGY PROF

## 2022-11-22 LAB — CBC WITH DIFFERENTIAL
Basos: 1 %
Hematocrit: 43.6 % (ref 34.0–46.6)
Hemoglobin: 14.4 g/dL (ref 11.1–15.9)
Immature Grans (Abs): 0 10*3/uL (ref 0.0–0.1)
MCHC: 33 g/dL (ref 31.5–35.7)

## 2022-11-22 LAB — CMP14+EGFR
Alkaline Phosphatase: 48 IU/L (ref 44–121)
Calcium: 9.3 mg/dL (ref 8.7–10.2)
Creatinine, Ser: 0.71 mg/dL (ref 0.57–1.00)
Globulin, Total: 2.4 g/dL (ref 1.5–4.5)

## 2022-11-22 LAB — KIT (D816V) DIGITAL PCR

## 2022-11-22 LAB — I002-IGE HORNET, WHITE FACE

## 2022-11-22 LAB — TRYPTASE: Tryptase: 3.9 ug/L (ref 2.2–13.2)

## 2022-11-24 LAB — ALPHA-GAL PANEL
Allergen Lamb IgE: 0.11 kU/L — AB
Beef IgE: 0.23 kU/L — AB
IgE (Immunoglobulin E), Serum: 106 IU/mL (ref 6–495)
O215-IgE Alpha-Gal: <0.1 kU/L
Pork IgE: 0.21 kU/L — AB

## 2022-11-25 ENCOUNTER — Encounter: Payer: Self-pay | Admitting: Allergy & Immunology

## 2022-11-25 LAB — HYMENOPTERA VENOM ALLERGY PROF

## 2022-11-25 LAB — CBC WITH DIFFERENTIAL
EOS (ABSOLUTE): 0 10*3/uL (ref 0.0–0.4)
Lymphocytes Absolute: 1.7 10*3/uL (ref 0.7–3.1)
MCH: 29.8 pg (ref 26.6–33.0)
Monocytes Absolute: 0.3 10*3/uL (ref 0.1–0.9)
Neutrophils Absolute: 3.1 10*3/uL (ref 1.4–7.0)
RDW: 12.8 % (ref 11.7–15.4)

## 2022-11-25 LAB — SEDIMENTATION RATE: Sed Rate: 20 mm/hr (ref 0–32)

## 2022-11-25 LAB — THYROID ANTIBODIES: Thyroperoxidase Ab SerPl-aCnc: 11 IU/mL (ref 0–34)

## 2022-11-25 LAB — CMP14+EGFR: BUN: 14 mg/dL (ref 6–20)

## 2022-11-26 LAB — PANEL 604726
Cor A 14 IgE: <0.1 kU/L
Cor A 8 IgE: <0.1 kU/L

## 2022-11-26 LAB — CMP14+EGFR
CO2: 19 mmol/L — ABNORMAL LOW (ref 20–29)
Total Protein: 6.7 g/dL (ref 6.0–8.5)

## 2022-11-26 LAB — HYMENOPTERA VENOM ALLERGY PROF
I003-IgE Yellow Jacket: <0.1 kU/L
I208-IgE Api m 1: <0.1 kU/L
I210-IgE Pol d 5: 0.47 kU/L — AB
I216-IgE Api m 5: <0.1 kU/L
Tryptase: 3.8 ug/L (ref 2.2–13.2)

## 2022-11-26 LAB — PEANUT COMPONENTS
F422-IgE Ara h 1: <0.1 kU/L
F423-IgE Ara h 2: <0.1 kU/L
F447-IgE Ara h 6: <0.1 kU/L

## 2022-11-26 LAB — ALLERGEN COMPONENT COMMENTS

## 2022-11-26 LAB — CBC WITH DIFFERENTIAL: RBC: 4.84 x10E6/uL (ref 3.77–5.28)

## 2022-11-26 LAB — CHRONIC URTICARIA

## 2022-11-26 LAB — ANTINUCLEAR ANTIBODIES, IFA: ANA Titer 1: NEGATIVE

## 2022-11-26 LAB — KIT (D816V) DIGITAL PCR

## 2022-11-28 LAB — N-METHYLHISTAMINE, 24 HR, U

## 2022-11-28 LAB — CMP14+EGFR
ALT: 10 IU/L (ref 0–32)
AST: 13 IU/L (ref 0–40)
Albumin/Globulin Ratio: 1.8 (ref 1.2–2.2)
Albumin: 4.3 g/dL (ref 3.9–4.9)
BUN/Creatinine Ratio: 20 (ref 9–23)
Glucose: 87 mg/dL (ref 70–99)
Potassium: 4.4 mmol/L (ref 3.5–5.2)
Sodium: 137 mmol/L (ref 134–144)
eGFR: 112 mL/min/{1.73_m2} (ref >59)

## 2022-11-28 LAB — IGE NUT PROF. W/COMPONENT RFLX
F017-IgE Hazelnut (Filbert): 4.3 kU/L — AB
F018-IgE Brazil Nut: <0.1 kU/L
F020-IgE Almond: 0.12 kU/L — AB
F202-IgE Cashew Nut: <0.1 kU/L
F203-IgE Pistachio Nut: 0.1 kU/L — AB
F256-IgE Walnut: <0.1 kU/L
Macadamia Nut, IgE: <0.1 kU/L
Peanut, IgE: 0.48 kU/L — AB
Pecan Nut IgE: <0.1 kU/L

## 2022-11-28 LAB — PEANUT COMPONENTS
F352-IgE Ara h 8: 2.45 kU/L — AB
F424-IgE Ara h 3: <0.1 kU/L
F427-IgE Ara h 9: <0.1 kU/L

## 2022-11-28 LAB — THYROID ANTIBODIES: Thyroglobulin Antibody: <1 IU/mL (ref 0.0–0.9)

## 2022-11-28 LAB — CBC WITH DIFFERENTIAL
Basophils Absolute: 0 10*3/uL (ref 0.0–0.2)
Monocytes: 6 %
Neutrophils: 59 %

## 2022-11-28 LAB — KIT (D816V) DIGITAL PCR

## 2022-11-28 LAB — PROSTAGLANDIN D2/CREATININE, U

## 2022-11-28 LAB — PANEL 604726
Cor A 1 IgE: 5.53 kU/L — AB
Cor A 9 IgE: <0.1 kU/L

## 2022-11-28 LAB — LEUKOTRIENE E4, 24 HR, U

## 2022-11-30 ENCOUNTER — Other Ambulatory Visit: Payer: Self-pay | Admitting: Sports Medicine

## 2022-12-02 ENCOUNTER — Telehealth: Payer: Self-pay

## 2022-12-02 LAB — N-METHYLHISTAMINE, 24 HR, U: Collection Duration: 24 h

## 2022-12-02 NOTE — Telephone Encounter (Signed)
Amil Amen W./ Referral Dept of [employer] [employer] - [phone] called in on 12/01/22 - DOB verified - requested total volume  of urine for patient's lab test - N-Methlhistamine, 24 Hr, Urine due to it not being listed on Requisition.  Per Boykin Reaper, Lab Corb Technician - Amil Amen was advised 2550 was the Total Volume.  Amil Amen verbalized understanding, no further questions.

## 2022-12-03 LAB — PROSTAGLANDIN D2, SERUM: Prostaglandin D2, serum: 21 pg/mL

## 2022-12-04 LAB — N-METHYLHISTAMINE, 24 HR, U
Creatinine Concent. 24 Hr, U: 94 mg/dL
Urine Volume: 1450 mL

## 2022-12-06 LAB — LEUKOTRIENE E4, 24 HR, U
Collection Duration: 24 h
Urine Volume: 1450 mL

## 2022-12-09 ENCOUNTER — Ambulatory Visit (INDEPENDENT_AMBULATORY_CARE_PROVIDER_SITE_OTHER): Payer: Commercial Managed Care - HMO | Admitting: Sports Medicine

## 2022-12-09 VITALS — BP 102/78 | Ht 66.0 in | Wt 162.0 lb

## 2022-12-09 DIAGNOSIS — M519 Unspecified thoracic, thoracolumbar and lumbosacral intervertebral disc disorder: Secondary | ICD-10-CM

## 2022-12-09 DIAGNOSIS — M545 Low back pain, unspecified: Secondary | ICD-10-CM

## 2022-12-09 NOTE — Assessment & Plan Note (Signed)
Patient responded very well to IM steroid injection She is now starting a home walking and easy motion exercise program If she continues this progress we will leave her on conservative care She will see me in 4 to 6 weeks unless her symptoms are resolved

## 2022-12-09 NOTE — Progress Notes (Signed)
Follow-up low back pain  Patient with Ehlers-Danlos syndrome.  When seen 2 weeks ago she had what appeared to be acute disc issues with left-sided sciatica.  Injection of 80 mg of Solu-Medrol IM gave her significant relief.  Now she states her pain is dropped from the level of about 8 down to a level of about 2.  She is able to walk and exercise some daily.  Back motion is much less painful.  She still has some soreness down the lateral aspect of her left leg.  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

## 2022-12-10 ENCOUNTER — Other Ambulatory Visit: Payer: Self-pay

## 2022-12-10 MED ORDER — MONTELUKAST SODIUM 10 MG PO TABS: 10.0000 mg | ORAL_TABLET | Freq: Every day | ORAL | 2 refills | Status: DC

## 2022-12-13 LAB — PROSTAGLANDIN D2/CREATININE, U
Prostaglandin D2, urine: 8.8 pg/mL
Prostaglandin D2/Cr Ratio: 6.6 ng/g

## 2022-12-13 LAB — LEUKOTRIENE E4, 24 HR, U
Creatinine, 24 HR, U: 1334 mg/24 h (ref 603–1783)
Leukotriene E4, U: 91 pg/mg Cr (ref <=104)

## 2022-12-29 ENCOUNTER — Other Ambulatory Visit: Payer: Self-pay

## 2022-12-29 ENCOUNTER — Encounter: Payer: Self-pay | Admitting: Sports Medicine

## 2022-12-29 MED ORDER — CYCLOBENZAPRINE HCL 10 MG PO TABS: 10.0000 mg | ORAL_TABLET | Freq: Three times a day (TID) | ORAL | 1 refills | Status: DC | PRN

## 2023-01-13 ENCOUNTER — Encounter: Payer: Self-pay | Admitting: Allergy & Immunology

## 2023-01-13 ENCOUNTER — Ambulatory Visit: Payer: Commercial Managed Care - HMO | Admitting: Allergy & Immunology

## 2023-01-13 ENCOUNTER — Other Ambulatory Visit: Payer: Self-pay

## 2023-01-13 VITALS — BP 110/60 | HR 100 | Temp 98.4°F | Ht 66.0 in | Wt 168.7 lb

## 2023-01-13 DIAGNOSIS — J302 Other seasonal allergic rhinitis: Secondary | ICD-10-CM

## 2023-01-13 DIAGNOSIS — G90A Postural orthostatic tachycardia syndrome (POTS): Secondary | ICD-10-CM | POA: Diagnosis not present

## 2023-01-13 DIAGNOSIS — J3089 Other allergic rhinitis: Secondary | ICD-10-CM

## 2023-01-13 DIAGNOSIS — Q7962 Hypermobile Ehlers-Danlos syndrome: Secondary | ICD-10-CM

## 2023-01-13 DIAGNOSIS — D8940 Mast cell activation, unspecified: Secondary | ICD-10-CM | POA: Diagnosis not present

## 2023-01-13 DIAGNOSIS — T63481D Toxic effect of venom of other arthropod, accidental (unintentional), subsequent encounter: Secondary | ICD-10-CM

## 2023-01-13 NOTE — Patient Instructions (Addendum)
1. Seasonal and perennial allergic rhinitis - Testing at the last visit: grasses, ragweed, weeds, trees, indoor molds, outdoor molds, dust mites, cat, and dog - Continue with: Zyrtec (cetirizine) 10mg  tablet TWICE daily and cimetidine twice daily (NOTE HIGHER DOSING OF ZYRTEC)  - You can use an extra dose of the antihistamine, if needed, for breakthrough symptoms.  - Consider nasal saline rinses 1-2 times daily to remove allergens from the nasal cavities as well as help with mucous clearance (this is especially helpful to do before the nasal sprays are given) - We are going to hold off on allergy shots since your symptoms are fairly mild.   2. 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  - We are going to work on getting Xolair approved for your symptoms.  - Consent for Xolair signed today. - Handout provided. - This might replace the cetirizine and the cimetidine.  - Information on Xolair provided today.  3. Stinging insect allergy - Avoid stinging insects.  - Anaphylaxis management plan up to date. - EpiPen sent (for the stinging insect allergy AND for the initiation of Xolair)   4. Return in about 3 months (around 04/15/2023).    Please inform us of any Emergency Department visits, hospitalizations, or changes in symptoms. Call us before going to the ED for breathing or allergy symptoms since we might be able to fit you in for a sick visit. Feel free to contact us anytime with any questions, problems, or concerns.  It was a pleasure to see you again today!  Websites that have reliable patient information: 1. American Academy of Asthma, Allergy, and Immunology: www.aaaai.org 2. Food Allergy Research and Education (FARE): foodallergy.org 3. Mothers of Asthmatics: http://www.asthmacommunitynetwork.org 4. American College of Allergy, Asthma, and Immunology:  www.acaai.org   COVID-19 Vaccine Information can be found at: [email or web address] For questions related to vaccine distribution or appointments, please email [email or web address] or call [phone].   We realize that you might be concerned about having an allergic reaction to the COVID19 vaccines. To help with that concern, WE ARE OFFERING THE COVID19 VACCINES IN OUR OFFICE! Ask the front desk for dates!     "Like" [place] on Facebook and Instagram for our latest updates!      A healthy democracy works best when [employer] participate! Make sure you are registered to vote! If you have moved or changed any of your contact information, you will need to get this updated before voting!  In some cases, you MAY be able to register to vote online: [email or web address]

## 2023-01-13 NOTE — Progress Notes (Unsigned)
FOLLOW UP  Date of Service/Encounter:  01/13/23   Assessment:   Seasonal and perennial allergic rhinitis   POTS (postural orthostatic tachycardia syndrome)   Ehlers-Danlos syndrome type III   Mast cell activation syndrome    Chronic urticaria   Insect sting allergy    Plan/Recommendations:   1. Seasonal and perennial allergic rhinitis - Testing at the last visit: grasses, ragweed, weeds, trees, indoor molds, outdoor molds, dust mites, cat, and dog - Continue with: Zyrtec (cetirizine) 10mg  tablet TWICE daily and cimetidine twice daily (NOTE HIGHER DOSING OF ZYRTEC)  - You can use an extra dose of the antihistamine, if needed, for breakthrough symptoms.  - Consider nasal saline rinses 1-2 times daily to remove allergens from the nasal cavities as well as help with mucous clearance (this is especially helpful to do before the nasal sprays are given) - We are going to hold off on allergy shots since your symptoms are fairly mild.   2. 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  - We are going to work on getting Xolair approved for your symptoms.  - Consent for Xolair signed today. - Handout provided. - This might replace the cetirizine and the cimetidine.  - Information on Xolair provided today.  3. Stinging insect allergy - Avoid stinging insects.  - Anaphylaxis management plan up to date. - EpiPen sent (for the stinging insect allergy AND for the initiation of Xolair)   4. Return in about 3 months (around 04/15/2023).    Subjective:   Betty Lewis is a 37 y.o. 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  Betty Lewis is a 37 y.o. female presenting for a follow up visit.  She was last seen in May 2024.  At that time, she had multiple indoor and outdoor allergens.  We stopped the Singulair and continue with Zyrtec twice daily and famotidine twice daily.  We did talk about allergy shots.  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  We increased her Zyrtec to twice daily and talked about adding on cromolyn.  We also talked about Xolair.    Since the last visit, she has mostly done well. She has been paying over $600 in total for labs.   Allergic Rhinitis Symptom History: She is doubling up on the cetirizine. She not taking the montelukast and never felt bady without it. Allergic rhinitis symptoms are under good control with the antihistamines alone. She did allergy shots when she was much younger and does not think that her current rhinitis symptoms really justify this route right now.  Food Allergy Symptom History: She has eaten peanut butter without a problem. She was doing fine with hazelnut. She is experiencing reactions to dairy. She has been using protein shakes which have dairy. She is having a lot of bloating in her stomach. She is avoiding red meat and does not eat it often at baseline.    Stinging Insect Symptom History: She was stung by a paper wasp at one point two years ago. It was not "pretty" and consisted of a large local reaction.  She did not have breathing problems that time around. We discussed an EpiPen at the last visit. She did not have it sent in yet, but she is open to it.   Skin Symptom History: She has flushing on  her face around 3pm every day. She works from home and is on [employer] often. She often just turns bright red for no reason at all. She is on the Tagament twice daily as well. She is open to doing Xolair. We spend some more time talking about this today.  She likes the idea of not flushing like she constantly does and getting off of some of her medications.   She thinks that she was in a flare around one month ago or so. She had pain that was worse. POTS was worse as well. She would have issues with tunnel vision all the way. It goes fairly dark and she holds onto a wall until things get better.   She is unemployed at the moment because the business went under. 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 She also is thinking of just starting her own firm since she knows how to do the work.  Otherwise, there have been no changes to her past medical history, surgical history, family history, or social history.    Review of Systems  Constitutional: Negative.  Negative for chills, fever, malaise/fatigue and weight loss.  HENT: Negative.  Negative for congestion, ear discharge, ear pain and sinus pain.        Positive for postnasal drip.  Positive for throat clearing.  Eyes:  Negative for pain, discharge and redness.  Respiratory:  Negative for cough, sputum production, shortness of breath and wheezing.   Cardiovascular: Negative.  Negative for chest pain and palpitations.  Gastrointestinal:  Negative for abdominal pain, constipation, diarrhea, heartburn, nausea and vomiting.  Skin:  Positive for itching and rash.  Neurological:  Negative for dizziness and headaches.  Endo/Heme/Allergies:  Positive for environmental allergies. Does not bruise/bleed easily.       Objective:    Blood pressure 110/60, pulse 100, temperature 98.4 F (36.9 C), height 5\' 6"  (1.676 m), weight 168 lb 11.2 oz (76.5 kg), SpO2 95 %. Body mass index is 27.23 kg/m.    Physical Exam Vitals reviewed.  Constitutional:      Appearance: She is well-developed.  HENT:     Head: Normocephalic and atraumatic.     Right Ear: Tympanic membrane, ear canal and external ear normal. No drainage, swelling or tenderness. Tympanic membrane is not injected, scarred, erythematous, retracted or bulging.     Left Ear: Tympanic membrane, ear canal and external ear normal. No drainage, swelling or tenderness. Tympanic membrane is not injected, scarred, erythematous, retracted or bulging.     Nose: No nasal deformity, septal deviation, mucosal edema or rhinorrhea.     Right Turbinates: Enlarged, swollen and pale.     Left Turbinates: Enlarged, swollen and pale.  Right Sinus: No maxillary sinus tenderness or frontal sinus tenderness.     Left Sinus: No maxillary sinus tenderness or frontal sinus tenderness.     Mouth/Throat:     Lips: Pink.     Mouth: Mucous membranes are moist. Mucous membranes are pale and not dry.     Pharynx: Uvula midline.     Comments: Cobblestoning in the posterior oropharynx. Eyes:     General: Lids are normal.        Right eye: No discharge.        Left eye: No discharge.     Conjunctiva/sclera:     Right eye: Right conjunctiva is not injected. No chemosis.    Left eye: Left conjunctiva is not injected. No chemosis.    Pupils: Pupils are equal, round, and reactive to light.  Cardiovascular:     Rate and Rhythm: Normal rate and regular rhythm.     Heart sounds: Normal heart sounds.  Pulmonary:     Effort: Pulmonary effort is normal. No tachypnea, accessory muscle usage or respiratory distress.     Breath sounds: Normal breath sounds. No wheezing, rhonchi or rales.  Chest:     Chest wall: No tenderness.  Abdominal:     Tenderness: There is no abdominal tenderness. There  is no guarding or rebound.  Lymphadenopathy:     Head:     Right side of head: No submandibular, tonsillar or occipital adenopathy.     Left side of head: No submandibular, tonsillar or occipital adenopathy.     Cervical: No cervical adenopathy.  Skin:    General: Skin is warm.     Capillary Refill: Capillary refill takes less than 2 seconds.     Coloration: Skin is not pale.     Findings: Rash present. No abrasion, erythema or petechiae. Rash is urticarial. Rash is not papular or vesicular.     Comments: Highly dermatographic skin.  Neurological:     Mental Status: She is alert.  Psychiatric:        Behavior: Behavior is cooperative.      Diagnostic studies: none        Malachi Bonds, MD  [facility]

## 2023-01-14 ENCOUNTER — Encounter: Payer: Self-pay | Admitting: *Deleted

## 2023-01-14 ENCOUNTER — Telehealth: Payer: Self-pay | Admitting: *Deleted

## 2023-01-14 ENCOUNTER — Encounter: Payer: Self-pay | Admitting: Allergy & Immunology

## 2023-01-14 MED ORDER — EPINEPHRINE 0.3 MG/0.3ML IJ SOAJ: 0.3000 mg | INTRAMUSCULAR | 1 refills | Status: DC | PRN

## 2023-01-14 NOTE — Telephone Encounter (Signed)
YASSSSSS!!

## 2023-01-14 NOTE — Telephone Encounter (Signed)
Spoke to patient and advised submit for Xolair and will reach out once delivery set to make appt to start therapy

## 2023-01-14 NOTE — Telephone Encounter (Signed)
-----   Message from Alfonse Spruce, MD sent at 01/14/2023  6:08 AM EDT ----- Actually confirmed Xolair start for either food allergies or CIU. IgE just over 100.

## 2023-01-14 NOTE — Telephone Encounter (Signed)
Tried to reach patient to advise approval, copay card and submit to [facility] but unable to l/m My chart message sent to patient

## 2023-01-21 ENCOUNTER — Other Ambulatory Visit: Payer: Self-pay | Admitting: Sports Medicine

## 2023-01-21 ENCOUNTER — Encounter: Payer: Self-pay | Admitting: Sports Medicine

## 2023-01-22 ENCOUNTER — Other Ambulatory Visit: Payer: Self-pay | Admitting: Sports Medicine

## 2023-01-22 MED ORDER — TRAMADOL HCL 50 MG PO TABS: ORAL_TABLET | ORAL | 3 refills | Status: DC

## 2023-02-03 ENCOUNTER — Telehealth: Payer: Self-pay | Admitting: *Deleted

## 2023-02-03 NOTE — Telephone Encounter (Signed)
Patient is going to be starting Xolair injections, she let me know that each office visit is $90 dollars copay and she was wondering if it will be the same price for her Xolair appointments? I advised I would reach out to you and check and let her know.

## 2023-02-03 NOTE — Telephone Encounter (Signed)
Received patients Xolair. Called and left her a voicemail asking for her to return call to schedule new start.

## 2023-02-05 ENCOUNTER — Ambulatory Visit (INDEPENDENT_AMBULATORY_CARE_PROVIDER_SITE_OTHER): Payer: Commercial Managed Care - HMO

## 2023-02-05 DIAGNOSIS — L501 Idiopathic urticaria: Secondary | ICD-10-CM

## 2023-02-05 MED ORDER — OMALIZUMAB 150 MG/ML ~~LOC~~ SOSY
300.0000 mg | PREFILLED_SYRINGE | SUBCUTANEOUS | Status: AC
Start: 2023-02-05 — End: ?
  Administered 2023-02-05 – 2023-04-29 (×4): 300 mg via SUBCUTANEOUS

## 2023-02-05 NOTE — Progress Notes (Signed)
Immunotherapy   Patient Details  Name: Betty Lewis MRN: [id number] Date of Birth: [birth year]  02/05/2023  Betty Lewis started injections for  Xolair Following schedule: Every twenty eight days.  Frequency: Every four weeks. Epi-Pen:Epi-Pen Available  Consent signed previously and patient instructions given. Patient sat in room twenty seven for thirty minutes without an issue.   Ralene Muskrat 02/05/2023, 10:04 AM

## 2023-02-22 ENCOUNTER — Encounter: Payer: Self-pay | Admitting: Allergy & Immunology

## 2023-02-23 NOTE — Telephone Encounter (Signed)
L/m for patient to contact me regarding injection costs

## 2023-03-05 ENCOUNTER — Ambulatory Visit: Payer: Commercial Managed Care - HMO

## 2023-03-05 DIAGNOSIS — L501 Idiopathic urticaria: Secondary | ICD-10-CM

## 2023-03-12 NOTE — Telephone Encounter (Signed)
Spoke to patient and advised no OV charge only injection and from her July inj charge she had $8 coinsruacne

## 2023-03-21 ENCOUNTER — Other Ambulatory Visit: Payer: Self-pay | Admitting: Sports Medicine

## 2023-03-21 DIAGNOSIS — D4709 Other mast cell neoplasms of uncertain behavior: Secondary | ICD-10-CM

## 2023-03-21 DIAGNOSIS — K30 Functional dyspepsia: Secondary | ICD-10-CM

## 2023-03-30 ENCOUNTER — Other Ambulatory Visit: Payer: Self-pay

## 2023-03-30 DIAGNOSIS — D4709 Other mast cell neoplasms of uncertain behavior: Secondary | ICD-10-CM

## 2023-03-30 DIAGNOSIS — K30 Functional dyspepsia: Secondary | ICD-10-CM

## 2023-03-30 MED ORDER — CIMETIDINE 400 MG PO TABS
400.0000 mg | ORAL_TABLET | Freq: Two times a day (BID) | ORAL | 6 refills | Status: DC
Start: 2023-03-30 — End: 2024-05-26

## 2023-04-02 ENCOUNTER — Ambulatory Visit (INDEPENDENT_AMBULATORY_CARE_PROVIDER_SITE_OTHER): Payer: Commercial Managed Care - HMO

## 2023-04-02 DIAGNOSIS — L501 Idiopathic urticaria: Secondary | ICD-10-CM | POA: Diagnosis not present

## 2023-04-02 NOTE — Progress Notes (Signed)
Immunotherapy   Patient Details  Name: Betty Lewis MRN: [id number] Date of Birth: [birth year]  04/02/2023  Shann Medal started injections for  Xolair self administertion Following schedule:Every twenty eight days. Frequency: Every four weeks. Epi-Pen:Epi-Pen Available  Consent signed previously and patient instructions given on how to self administer Xolair. Patient successfully administered her two Xolair injections into both sides of her abdomen without an issue.    Ralene Muskrat 04/02/2023, 10:08 AM

## 2023-04-16 ENCOUNTER — Ambulatory Visit (INDEPENDENT_AMBULATORY_CARE_PROVIDER_SITE_OTHER): Payer: Managed Care, Other (non HMO) | Admitting: Allergy & Immunology

## 2023-04-16 ENCOUNTER — Other Ambulatory Visit: Payer: Self-pay

## 2023-04-16 ENCOUNTER — Encounter: Payer: Self-pay | Admitting: Allergy & Immunology

## 2023-04-16 VITALS — BP 120/70 | HR 96 | Temp 97.6°F | Resp 18 | Ht 64.57 in | Wt 166.0 lb

## 2023-04-16 DIAGNOSIS — J302 Other seasonal allergic rhinitis: Secondary | ICD-10-CM

## 2023-04-16 DIAGNOSIS — D8940 Mast cell activation, unspecified: Secondary | ICD-10-CM

## 2023-04-16 DIAGNOSIS — J3089 Other allergic rhinitis: Secondary | ICD-10-CM | POA: Diagnosis not present

## 2023-04-16 DIAGNOSIS — T63481D Toxic effect of venom of other arthropod, accidental (unintentional), subsequent encounter: Secondary | ICD-10-CM

## 2023-04-16 DIAGNOSIS — L501 Idiopathic urticaria: Secondary | ICD-10-CM

## 2023-04-16 DIAGNOSIS — G90A Postural orthostatic tachycardia syndrome (POTS): Secondary | ICD-10-CM | POA: Diagnosis not present

## 2023-04-16 DIAGNOSIS — Q7962 Hypermobile Ehlers-Danlos syndrome: Secondary | ICD-10-CM | POA: Diagnosis not present

## 2023-04-16 NOTE — Progress Notes (Signed)
FOLLOW UP  Date of Service/Encounter:  04/16/23   Assessment:   Seasonal and perennial allergic rhinitis (grasses, ragweed, weeds, trees, indoor and outdoor molds, dust mite, cat, dog) - with minimal symptoms   POTS (postural orthostatic tachycardia syndrome)   Ehlers-Danlos syndrome type III   Mast cell activation syndrome    Chronic urticaria - controlled with Xolair monthly  Food sensitizations (red meat, peanuts, tree nuts)   Insect sting allergy (yellowjacket, wasp)  Plan/Recommendations:   1. Seasonal and perennial allergic rhinitis - Testing at the last visit: grasses, ragweed, weeds, trees, indoor molds, outdoor molds, dust mites, cat, and dog - I am fine with stopping the cetirizine and cimetidine  - You can use an extra dose of the antihistamine, if needed, for breakthrough symptoms.  - Consider nasal saline rinses 1-2 times daily to remove allergens from the nasal cavities as well as help with mucous clearance (this is especially helpful to do before the nasal sprays are given)  2. 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  - I look forward to seeing how the continuous blood glucose monitor works.   3. Stinging insect allergy - EpiPen is up to date.   4. Follow up in May 2025 and then we can figure where to go from here. We can find you an allergist out there.    Subjective:   Betty Lewis is a 37 y.o. 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  Discussed the use of AI scribe software for clinical note transcription with the patient, who gave verbal consent to proceed.  Betty Lewis is a 37 y.o. female presenting for a follow up visit.  She seen in July 2024.  At that time, we continue with Zyrtec 10 mg twice daily and cimetidine twice daily.  For her POTS, we worked on getting Xolair approved.  She did finally start this.  She continue to avoid stinging insects.  EpiPen was sent in.  Since the last visit, she has done very well.    The patient, with a history of chronic idiopathic urticaria and Postural Orthostatic Tachycardia Syndrome (POTS), reports no significant changes in their health status since starting Xolair. They deny any recent episodes of urticaria. They have been taking Zyrtec and Tagamet once daily and are considering discontinuing these medications. They have previously experienced severe itching for weeks after attempting to stop Zyrtec.  The patient also reports waking up in the middle of the night feeling hot and unwell, which has led to the suspicion of hypoglycemia. They have been monitoring their blood glucose levels for about a week, with readings consistently on  the lower end of the normal range.  Their POTS symptoms remain variable, with good and bad days. They have not experienced any insect stings recently and confirm that their Epipen is up to date.  The patient is also planning a move to [place] and is considering continuing their Xolair shots independently, with the medication shipped to their location. 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 This is right up her alley, with her degree in public health. She seems to be enjoying the job immensely.   The patient's family is based in their current location, and they have been the primary caregiver for their parents. They express a desire to pass on this responsibility to their younger brother as they prepare for their move. She is ready to move and try out something new. She is planning to move to the [facility] area since it is cooler there and she has some friends in that area already.     Review of her labs show that she is positive to hazelnut as well as pistachio and almond.  We recommended avoidance of hazelnut.  She was sensitized to Ara h 8 predominantly, so we talked about doing a challenge.  Her alpha gal panel had positive IgE to beef, pork, and lamb.  These were fairly low.  She had a normal lipid trying to eat for urine level.  She had a normal prostaglandin D2 urine level.  She had a normal N-methylhistamine urine level.  She has a normal prostaglandin D2 serum level.  ANA was negative.  Chronic urticaria panel was negative.  Inflammatory markers were normal.  Tryptase was normal.  Thyroid antibodies were negative.  A CBC was normal.  Patient was negative.  Stains a panel was positive to paper wasp and yellowjacket venom.  Her metabolic panel was normal.  Otherwise, there have been no changes to her past medical history, surgical history, family history, or social history.    Review of systems otherwise negative other than that mentioned in the HPI.    Objective:   Blood pressure 120/70, pulse 96, temperature 97.6 F (36.4 C), resp. rate 18, height 5' 4.57" (1.64 m), weight 166 lb (75.3 kg), SpO2 98%. Body mass index is 28 kg/m.    Physical Exam Vitals reviewed.  Constitutional:      General: She is awake.     Appearance: She is well-developed.  HENT:     Head: Normocephalic  and atraumatic.     Right Ear: Tympanic membrane, ear canal and external ear normal. No drainage, swelling or tenderness. Tympanic membrane is not injected, scarred, erythematous, retracted or bulging.     Left Ear: Tympanic membrane, ear canal and external ear normal. No drainage, swelling or tenderness. Tympanic membrane is not injected, scarred, erythematous, retracted or bulging.     Nose: No nasal deformity, septal deviation, mucosal edema or rhinorrhea.     Right Turbinates: Enlarged, swollen and pale.     Left Turbinates: Enlarged, swollen and pale.     Right Sinus: No maxillary sinus tenderness or frontal sinus tenderness.     Left Sinus: No maxillary sinus tenderness or frontal sinus tenderness.     Mouth/Throat:     Lips: Pink.     Mouth: Mucous membranes are moist. Mucous membranes are pale and not dry.     Pharynx: Uvula midline.     Comments: Cobblestoning in the posterior  oropharynx. Eyes:     General: Lids are normal.        Right eye: No discharge.        Left eye: No discharge.     Conjunctiva/sclera:     Right eye: Right conjunctiva is not injected. No chemosis.    Left eye: Left conjunctiva is not injected. No chemosis.    Pupils: Pupils are equal, round, and reactive to light.  Cardiovascular:     Rate and Rhythm: Normal rate and regular rhythm.     Heart sounds: Normal heart sounds.  Pulmonary:     Effort: Pulmonary effort is normal. No tachypnea, accessory muscle usage or respiratory distress.     Breath sounds: Normal breath sounds. No wheezing, rhonchi or rales.     Comments: Moving air well in all lung fields. No increased work of breathing noted.  Chest:     Chest wall: No tenderness.  Abdominal:     Tenderness: There is no abdominal tenderness. There is no guarding or rebound.  Lymphadenopathy:     Head:     Right side of head: No submandibular, tonsillar or occipital adenopathy.     Left side of head: No submandibular, tonsillar or occipital adenopathy.      Cervical: No cervical adenopathy.  Skin:    General: Skin is warm.     Capillary Refill: Capillary refill takes less than 2 seconds.     Coloration: Skin is not pale.     Findings: Rash present. No abrasion, erythema or petechiae. Rash is urticarial. Rash is not papular or vesicular.     Comments: Highly dermatographic skin.  Neurological:     Mental Status: She is alert.  Psychiatric:        Behavior: Behavior is cooperative.      Diagnostic studies: none      Malachi Bonds, MD  [facility]

## 2023-04-16 NOTE — Patient Instructions (Addendum)
1. Seasonal and perennial allergic rhinitis - Testing at the last visit: grasses, ragweed, weeds, trees, indoor molds, outdoor molds, dust mites, cat, and dog - I am fine with stopping the cetirizine and cimetidine  - You can use an extra dose of the antihistamine, if needed, for breakthrough symptoms.  - Consider nasal saline rinses 1-2 times daily to remove allergens from the nasal cavities as well as help with mucous clearance (this is especially helpful to do before the nasal sprays are given)  2. 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  - I look forward to seeing how the continuous blood glucose monitor works.   3. Stinging insect allergy - EpiPen is up to date.   4. Follow up in May 2025 and then we can figure where to go from here. We can find you an allergist out there.     Please inform us of any Emergency Department visits, hospitalizations, or changes in symptoms. Call us before going to the ED for breathing or allergy symptoms since we might be able to fit you in for a sick visit. Feel free to contact us anytime with any questions, problems, or concerns.  It was a pleasure to see you again today!  Websites that have reliable patient information: 1. American Academy of Asthma, Allergy, and Immunology: www.aaaai.org 2. Food Allergy Research and Education (FARE): foodallergy.org 3. Mothers of Asthmatics: http://www.asthmacommunitynetwork.org 4. American College of Allergy, Asthma, and Immunology: www.acaai.org   COVID-19 Vaccine Information can be found at: [email or web address] For questions related to vaccine distribution or appointments, please email [email or web address] or call [phone].   We realize that you might be concerned about having an allergic reaction to the COVID19 vaccines. To help with that  concern, WE ARE OFFERING THE COVID19 VACCINES IN OUR OFFICE! Ask the front desk for dates!     "Like" [place] on Facebook and Instagram for our latest updates!      A healthy democracy works best when [employer] participate! Make sure you are registered to vote! If you have moved or changed any of your contact information, you will need to get this updated before voting!  In some cases, you MAY be able to register to vote online: [email or web address]

## 2023-04-17 ENCOUNTER — Encounter: Payer: Self-pay | Admitting: Allergy & Immunology

## 2023-04-29 ENCOUNTER — Ambulatory Visit: Payer: Commercial Managed Care - HMO | Admitting: *Deleted

## 2023-04-29 DIAGNOSIS — L501 Idiopathic urticaria: Secondary | ICD-10-CM

## 2023-07-25 ENCOUNTER — Other Ambulatory Visit: Payer: Self-pay | Admitting: Internal Medicine

## 2023-08-10 ENCOUNTER — Ambulatory Visit: Payer: Commercial Managed Care - HMO | Admitting: Family Medicine

## 2023-08-10 ENCOUNTER — Ambulatory Visit
Admission: RE | Admit: 2023-08-10 | Discharge: 2023-08-10 | Disposition: A | Discharge status: Home / Self Care | Payer: Commercial Managed Care - HMO | Source: Ambulatory Visit | Attending: Family Medicine | Admitting: Family Medicine

## 2023-08-10 VITALS — BP 114/67 | Ht 66.0 in | Wt 145.0 lb

## 2023-08-10 DIAGNOSIS — M79645 Pain in left finger(s): Secondary | ICD-10-CM | POA: Diagnosis not present

## 2023-08-10 NOTE — Progress Notes (Signed)
PCP: Shon Hale, MD  Subjective:   HPI: Patient is a 38 y.o. female with history of Ehlers Danlos Syndrome here for left thumb pain. Started while drilling into a wall yesterday. She is right hand dominant and holding the drill with the right while holding screws in her left hand. No inciting injury or event. Onset of pain was sudden, localized to the palmar base of the left thumb. Described as a mild sharp pain with throbbing. Pain subsided with rest but returned later during dinner while holding a bowl of food. Improved with rest, but gradually got worse overnight prompting today's visit. No distal numbness or tingling. Denies weakness, but reports difficulty with gripping due to pain to the both palmar and dorsal aspect of the base of the thumb. Denies prior history of trauma or dislocation of the thumb. She has many splints at home including a thumb spica, which she has applied and has helped with the discomfort.    Past Medical History:  Diagnosis Date   Angio-edema    Bulging lumbar disc    X 2   PONV (postoperative nausea and vomiting)    Urticaria     Current Outpatient Medications on File Prior to Visit  Medication Sig Dispense Refill   AVIANE 0.1-20 MG-MCG tablet Take 1 tablet by mouth daily.     cetirizine (ZYRTEC) 10 MG tablet Take 10 mg by mouth daily. In the morning     cimetidine (TAGAMET) 400 MG tablet Take 1 tablet (400 mg total) by mouth 2 (two) times daily. 60 tablet 6   cyclobenzaprine (FLEXERIL) 10 MG tablet Take 1 tablet (10 mg total) by mouth 3 (three) times daily as needed for muscle spasms. 60 tablet 1   EPINEPHrine (EPIPEN 2-PAK) 0.3 mg/0.3 mL IJ SOAJ injection Inject 0.3 mg into the muscle as needed for anaphylaxis. 0.3 mL 1   lisdexamfetamine (VYVANSE) 20 MG capsule Take 20 mg by mouth daily as needed.     pindolol (VISKEN) 5 MG tablet Take 1 tablet by mouth twice daily 60 tablet 1   traMADol (ULTRAM) 50 MG tablet Take 1-2 tabs every 12 hours as needed  for pain 60 tablet 3   ZEPBOUND 15 MG/0.5ML Pen Inject 15 mg into the skin once a week.     Current Facility-Administered Medications on File Prior to Visit  Medication Dose Route Frequency Provider Last Rate Last Admin   omalizumab Geoffry Paradise) prefilled syringe 300 mg  300 mg Subcutaneous Q28 days Alfonse Spruce, MD   300 mg at 04/29/23 0930    Past Surgical History:  Procedure Laterality Date   HIP ARTHROSCOPY Left 07/28/2019   HYSTEROSCOPY N/A 11/06/2017   Procedure: HYSTEROSCOPY;  Surgeon: Myna Hidalgo, DO;  Location: [facility];  Service: Gynecology;  Laterality: N/A;   IUD REMOVAL N/A 11/06/2017   Procedure: INTRAUTERINE DEVICE (IUD) REMOVAL;  Surgeon: Myna Hidalgo, DO;  Location: [facility];  Service: Gynecology;  Laterality: N/A;   LEEP  2013   TONSILLECTOMY  2005   WISDOM TOOTH EXTRACTION  2004    No Active Allergies  BP 114/67   Ht 5\' 6"  (1.676 m)   Wt 145 lb (65.8 kg)   BMI 23.40 kg/m      04/05/2020    2:50 PM  [facility]  Frequency of aerobic exercise (# of days/week) 0  Average time in minutes 0  Frequency of strengthening activities (# of days/week) 0        No data  to display              Objective:  Physical Exam:  Gen: NAD, comfortable in exam room MSK: Left hand: No edema or deformity of bilateral hands. Tenderness over the 1st CMC. No tenderness 1st dorsal compartment, carpal tunnel. Full ROM of the 1st digit with pain with adduction of the left 1st digit at the [facility]. Grip strength intact. NV intact. Negative tinels carpal tunnel   Assessment & Plan:  1. 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 No evidence of dislocation or fracture, but plan to obtain X-rays. Advised continued use of thumb spica, icing, and Advil for pain management. Follow-up in 2 weeks. The patient expressed understanding and is in agreement with the plan.   Nechama Guard, MS4 [facility] [employer]

## 2023-08-10 NOTE — Patient Instructions (Signed)
Get x-rays after you leave today. If these are normal this is a 1st CMC synovitis. Icing 15 minutes at a time 3-4 times a day. Wear the thumb spica brace for support - ok to remove to ice the area, take a shower. Advil 600mg  three times a day with food - take for 7 days then as needed. Follow up with me in 2 weeks.

## 2023-09-01 ENCOUNTER — Encounter: Payer: Self-pay | Admitting: Allergy & Immunology

## 2023-09-06 ENCOUNTER — Encounter: Payer: Self-pay | Admitting: Sports Medicine

## 2023-09-07 ENCOUNTER — Ambulatory Visit: Payer: Commercial Managed Care - HMO | Attending: Internal Medicine | Admitting: Internal Medicine

## 2023-09-07 ENCOUNTER — Encounter: Payer: Self-pay | Admitting: Internal Medicine

## 2023-09-07 ENCOUNTER — Telehealth: Payer: Self-pay

## 2023-09-07 VITALS — BP 95/76 | HR 90 | Ht 66.0 in | Wt 145.0 lb

## 2023-09-07 DIAGNOSIS — G901 Familial dysautonomia [Riley-Day]: Secondary | ICD-10-CM

## 2023-09-07 NOTE — Telephone Encounter (Signed)
 ..  Pt understands that although there may be some limitations with this type of visit, we will take all precautions to reduce any security or privacy concerns.  Pt understands that this will be treated like an in office visit and we will file with pt's insurance, and there may be a patient responsible charge related to this service. ? ?

## 2023-09-07 NOTE — Progress Notes (Signed)
 Electrophysiology TeleHealth Note      Date:  09/07/2023   ID:  Betty, Lewis 1985/08/08, MRN [id number]  Location: patient's home  Provider location: [address] [place]  Evaluation Performed: Follow-up visit  PCP:  Shon Hale, MD  Cardiologist:    Electrophysiologist:  SK   Chief Complaint:     History of Present Illness:   My location is [facility]. The Patients location is their work. ZIYONNA Lewis is a 38 y.o. female who presents via audio conferencing for a telehealth visit today.  Since last being seen in our clinic for dysautonomia with orthostatic intolerance in the context of joint laxity/hypermobility syndrome the patient reports fine-- well compensated Seen an allergy>>  MCAS--zolair  Sx resolved-- skin reactions; dermatographia, flush and itchy, ( Allergist--Joel Gallagher)   JHS--EDS  ongoing pain but no recent physical therapy  ( cost dependent)   Remains pindo    The patient denies symptoms of fevers, chills, cough, or new SOB worrisome for COVID 19.    Past Medical History:  Diagnosis Date   Angio-edema    Bulging lumbar disc    X 2   PONV (postoperative nausea and vomiting)    Urticaria     Past Surgical History:  Procedure Laterality Date   HIP ARTHROSCOPY Left 07/28/2019   HYSTEROSCOPY N/A 11/06/2017   Procedure: HYSTEROSCOPY;  Surgeon: Myna Hidalgo, DO;  Location: [facility];  Service: Gynecology;  Laterality: N/A;   IUD REMOVAL N/A 11/06/2017   Procedure: INTRAUTERINE DEVICE (IUD) REMOVAL;  Surgeon: Myna Hidalgo, DO;  Location: [facility];  Service: Gynecology;  Laterality: N/A;   LEEP  2013   TONSILLECTOMY  2005   WISDOM TOOTH EXTRACTION  2004    Current Outpatient Medications  Medication Sig Dispense Refill   AVIANE 0.1-20 MG-MCG tablet Take 1 tablet by mouth daily.     cetirizine (ZYRTEC) 10 MG tablet Take 10 mg by mouth daily. In the morning     cimetidine (TAGAMET) 400 MG tablet Take 1  tablet (400 mg total) by mouth 2 (two) times daily. 60 tablet 6   cyclobenzaprine (FLEXERIL) 10 MG tablet Take 1 tablet (10 mg total) by mouth 3 (three) times daily as needed for muscle spasms. 60 tablet 1   EPINEPHrine (EPIPEN 2-PAK) 0.3 mg/0.3 mL IJ SOAJ injection Inject 0.3 mg into the muscle as needed for anaphylaxis. 0.3 mL 1   lisdexamfetamine (VYVANSE) 20 MG capsule Take 20 mg by mouth daily as needed.     pindolol (VISKEN) 5 MG tablet Take 1 tablet by mouth twice daily 60 tablet 1   traMADol (ULTRAM) 50 MG tablet Take 1-2 tabs every 12 hours as needed for pain 60 tablet 3   ZEPBOUND 15 MG/0.5ML Pen Inject 15 mg into the skin once a week.     Current Facility-Administered Medications  Medication Dose Route Frequency Provider Last Rate Last Admin   omalizumab Geoffry Paradise) prefilled syringe 300 mg  300 mg Subcutaneous Q28 days Alfonse Spruce, MD   300 mg at 04/29/23 0930    Allergies:   Patient has no active allergies.   ROS:  Please see the history of present illness.   All other systems are personally reviewed and negative.    Exam:    Vital Signs:  BP 95/76 Comment: pt provided  Pulse 90 Comment: pt provided  Ht 5\' 6"  (1.676 m)   Wt 145 lb (65.8 kg) Comment: pt provided  LMP 08/17/2023 (Approximate)   BMI 23.40 kg/m        Labs/Other Tests and Data Reviewed:    Recent Labs: 11/20/2022: ALT 10; BUN 14; Creatinine, Ser 0.71; Hemoglobin 14.4; Potassium 4.4; Sodium 137   Wt Readings from Last 3 Encounters:  09/07/23 145 lb (65.8 kg)  08/10/23 145 lb (65.8 kg)  04/16/23 166 lb (75.3 kg)     Other studies personally reviewed: Additional studies/ records that were reviewed today include:    ASSESSMENT & PLAN:    Orthostatic intolerance  EDS/joint hypermobility syndrome  Hip pain impairing sleep  MCAS diagnosis  Orthostatic symptoms quiescinct Not able toafford ongoing PT for JHS/EDS  Now on Xolair for  MCAS    Follow-up:  12 m    Current medicines are  reviewed at length with the patient today.   The patient does not have concerns regarding her medicines.  The following changes were made today: needs refill for pindolol  Labs/ tests ordered today include:  No orders of the defined types were placed in this encounter.     Today, I have spent 11 minutes with the patient with telehealth technology discussing the above.  Signed, Sherryl Manges, MD  09/07/2023 11:28 AM     [facility] [address] [place] [address] [phone] (office) [phone] (fax)

## 2023-09-07 NOTE — Patient Instructions (Signed)

## 2023-09-18 ENCOUNTER — Other Ambulatory Visit: Payer: Self-pay | Admitting: Internal Medicine

## 2023-11-24 ENCOUNTER — Encounter: Payer: Self-pay | Admitting: Allergy & Immunology

## 2023-11-24 ENCOUNTER — Other Ambulatory Visit: Payer: Self-pay

## 2023-11-24 ENCOUNTER — Ambulatory Visit (INDEPENDENT_AMBULATORY_CARE_PROVIDER_SITE_OTHER): Payer: Managed Care, Other (non HMO) | Admitting: Allergy & Immunology

## 2023-11-24 VITALS — BP 116/64 | HR 92 | Temp 97.5°F | Resp 18 | Ht 64.96 in | Wt 124.9 lb

## 2023-11-24 DIAGNOSIS — J302 Other seasonal allergic rhinitis: Secondary | ICD-10-CM

## 2023-11-24 DIAGNOSIS — Q7962 Hypermobile Ehlers-Danlos syndrome: Secondary | ICD-10-CM | POA: Diagnosis not present

## 2023-11-24 DIAGNOSIS — G90A Postural orthostatic tachycardia syndrome (POTS): Secondary | ICD-10-CM | POA: Diagnosis not present

## 2023-11-24 DIAGNOSIS — J3089 Other allergic rhinitis: Secondary | ICD-10-CM | POA: Diagnosis not present

## 2023-11-24 DIAGNOSIS — L501 Idiopathic urticaria: Secondary | ICD-10-CM

## 2023-11-24 DIAGNOSIS — D8940 Mast cell activation, unspecified: Secondary | ICD-10-CM

## 2023-11-24 NOTE — Patient Instructions (Addendum)
 1. Seasonal and perennial allergic rhinitis - Testing initially showed: grasses, ragweed, weeds, trees, indoor molds, outdoor molds, dust mites, cat, and dog - You can use an extra dose of the antihistamine, if needed, for breakthrough symptoms.  - We will increase to every 3 weeks.  - Dupixent is an option if needed, but I am not sure that this is going to do anything with regards to preventing anaphylaxis.   2. 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  - It seems that things are stable at this point with lots of normal findings.   3. Stinging insect allergy  - EpiPen  is up to date.   4. Return in about 6 months (around 05/26/2024). You can have the follow up appointment with Dr. Idolina Maker or a Nurse Practicioner (our Nurse Practitioners are excellent and always have Physician oversight!).    OB recommendation: Dr. Axel Bohr   Please inform us  of any Emergency Department visits, hospitalizations, or changes in symptoms. Call us  before going to the ED for breathing or allergy  symptoms since we might be able to fit you in for a sick visit. Feel free to contact us  anytime with any questions, problems, or concerns.  It was a pleasure to see you again today!  Websites that have reliable patient information: 1. American Academy of Asthma, Allergy , and Immunology: www.aaaai.org 2. Food Allergy  Research and Education (FARE): foodallergy.org 3. Mothers of Asthmatics: http://www.asthmacommunitynetwork.org 4. [employer] of Allergy , Asthma, and Immunology: www.acaai.org      "Like" us  on Facebook and Instagram for our latest updates!      A healthy democracy works best when [employer] participate! Make sure you are registered to vote! If you have moved or changed any of your contact information, you will need to get this updated before voting! Scan the QR codes below to  learn more!

## 2023-11-24 NOTE — Progress Notes (Signed)
 FOLLOW UP  Date of Service/Encounter:  11/24/23   Assessment:   Seasonal and perennial allergic rhinitis (grasses, ragweed, weeds, trees, indoor and outdoor molds, dust mite, cat, dog) - with minimal symptoms   POTS (postural orthostatic tachycardia syndrome)   Ehlers-Danlos syndrome type III   Mast cell activation syndrome    Chronic urticaria - controlled with Xolair  monthly   Food sensitizations (red meat, peanuts, tree nuts)   Insect sting allergy  (yellowjacket, wasp)  Enlarged cervical lymph node around April  Plan/Recommendations:   1. Seasonal and perennial allergic rhinitis - Testing initially showed: grasses, ragweed, weeds, trees, indoor molds, outdoor molds, dust mites, cat, and dog - You can use an extra dose of the antihistamine, if needed, for breakthrough symptoms.  - We will increase to every 3 weeks.  - Dupixent is an option if needed, but I am not sure that this is going to do anything with regards to preventing anaphylaxis.   2. 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  - It seems that things are stable at this point with lots of normal findings.   3. Stinging insect allergy  - EpiPen  is up to date.   4. Return in about 6 months (around 05/26/2024). You can have the follow up appointment with Dr. Idolina Maker or a Nurse Practicioner (our Nurse Practitioners are excellent and always have Physician oversight!).   Subjective:   Betty Lewis is a 38 y.o. 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  Discussed the use of AI scribe software for clinical note transcription with the patient and/or guardian, who gave verbal consent to proceed.  Cambrielle is a 38 y.o. female presenting for a follow up visit.  She was last seen in October 2024.  At that time, she decided to stop the cetirizine and the cimetidine .  She had testing in the past that been positive to multiple indoor and outdoor allergens.  For her POTS, she continues Xolair  monthly.  She was also going to be starting a continuous glucose monitor.  Her EpiPen  is up-to-date for her stinging insect allergy .  Since last visit, she has done well.   She experiences mast cell activation symptoms, including rashes and fatigue, particularly towards the end of her 28-day Xolair  dosing cycle, around days 23 to 24. In January, she developed a rash three days before her Xolair  shot, which resolved in two weeks with triamcinolone . She also experiences significant fatigue and illness before her Xolair  shot, which improves after receiving the injection. She has started taking Zyrtec towards the end of the Xolair  cycle, which seems to help.  She is concerned about hair  loss associated with Xolair , which has since resolved. Last month, she experienced a swollen lymph node, which was painful but resolved in about a week without fever or other symptoms.  She has a history of POTS, which is generally managed but worsens during her menstrual periods. Recently, she experienced an 18-day menstrual period and has had prolonged periods in the past. She has  consulted with a nurse practitioner but is seeking a new provider for further evaluation.  She had her vitamin D sent, but no other further workup.  An ultrasound was normal as well, but she never had any workup to look for premature ovarian syndrome.  She has decreased her carbohydrate intake and increased protein, which she notes has improved her mast cell pain and POTS symptoms. She is not currently taking any pills and relies on Xolair  for management of her symptoms.   She was going to move to [place] , but her mother had some strokes and then got a divorce, so they are trying to work through that before they travel across country.  However, she is going to be moving in the next year once her mother's divorce is finalized.  Otherwise, there have been no changes to her past medical history, surgical history, family history, or social history.    Review of systems otherwise negative other than that mentioned in the HPI.    Objective:   Blood pressure 116/64, pulse 92, temperature (!) 97.5 F (36.4 C), temperature source Temporal, resp. rate 18, height 5' 4.96" (1.65 m), weight 124 lb 14.4 oz (56.7 kg), SpO2 96%. Body mass index is 20.81 kg/m.    Physical Exam Vitals reviewed.  Constitutional:      General: She is awake.     Appearance: She is well-developed.     Comments: Pleasant.  Cooperative.  HENT:     Head: Normocephalic and atraumatic.     Right Ear: Tympanic membrane, ear canal and external ear normal. No drainage, swelling or tenderness. Tympanic membrane is not injected, scarred, erythematous, retracted or bulging.     Left Ear: Tympanic membrane, ear canal and external ear normal. No drainage, swelling or tenderness. Tympanic membrane is not injected, scarred, erythematous, retracted or bulging.     Nose: No nasal deformity, septal deviation, mucosal edema or rhinorrhea.     Right Turbinates: Enlarged, swollen and pale.     Left Turbinates: Enlarged, swollen and pale.      Right Sinus: No maxillary sinus tenderness or frontal sinus tenderness.     Left Sinus: No maxillary sinus tenderness or frontal sinus tenderness.     Comments: No polyps.    Mouth/Throat:     Lips: Pink.     Mouth: Mucous membranes are moist. Mucous membranes are pale and not dry.     Pharynx: Uvula midline.     Comments: Cobblestoning in the posterior oropharynx. Eyes:     General: Lids are normal.        Right eye: No discharge.        Left eye: No discharge.     Conjunctiva/sclera:     Right eye: Right conjunctiva is not injected. No chemosis.    Left eye: Left conjunctiva is not injected. No chemosis.    Pupils: Pupils are equal, round, and reactive to light.  Cardiovascular:     Rate and Rhythm: Normal rate and regular rhythm.     Heart sounds: Normal heart sounds.  Pulmonary:     Effort: Pulmonary effort is normal. No tachypnea,  accessory muscle usage or respiratory distress.     Breath sounds: Normal breath sounds. No wheezing, rhonchi or rales.     Comments: Moving air well in all lung fields. No increased work of breathing noted.  Chest:     Chest wall: No tenderness.  Abdominal:     Tenderness: There is no abdominal tenderness. There is no guarding or rebound.  Lymphadenopathy:     Head:     Right side of head: No submandibular, tonsillar or occipital adenopathy.     Left side of head: No submandibular, tonsillar or occipital adenopathy.     Cervical: No cervical adenopathy.  Skin:    General: Skin is warm.     Capillary Refill: Capillary refill takes less than 2 seconds.     Coloration: Skin is not pale.     Findings: No abrasion, erythema, petechiae or rash. Rash is not papular, urticarial or vesicular.     Comments: Skin completely clear.  Neurological:     Mental Status: She is alert.  Psychiatric:        Behavior: Behavior is cooperative.      Diagnostic studies: none      Drexel Gentles, MD  [facility]

## 2023-12-01 ENCOUNTER — Telehealth: Payer: Self-pay | Admitting: *Deleted

## 2023-12-01 MED ORDER — XOLAIR 300 MG/2ML ~~LOC~~ SOSY: 300.0000 mg | PREFILLED_SYRINGE | SUBCUTANEOUS | 11 refills | Status: DC

## 2023-12-01 NOTE — Telephone Encounter (Signed)
 Thanks [employer]

## 2023-12-01 NOTE — Telephone Encounter (Signed)
-----   Message from [place] Chuck sent at 11/24/2023 11:16 AM EDT ----- We are going to increase to every two weeks.

## 2023-12-01 NOTE — Telephone Encounter (Signed)
 T/c to patient advised increased dosing to every 14 days with new rx to [facility] will start with new dosing with next shipment

## 2023-12-17 ENCOUNTER — Ambulatory Visit (INDEPENDENT_AMBULATORY_CARE_PROVIDER_SITE_OTHER): Admitting: Sports Medicine

## 2023-12-17 ENCOUNTER — Other Ambulatory Visit: Payer: Self-pay

## 2023-12-17 VITALS — BP 98/64 | Ht 66.0 in | Wt 135.0 lb

## 2023-12-17 DIAGNOSIS — M25512 Pain in left shoulder: Secondary | ICD-10-CM

## 2023-12-17 DIAGNOSIS — G5600 Carpal tunnel syndrome, unspecified upper limb: Secondary | ICD-10-CM | POA: Diagnosis not present

## 2023-12-17 NOTE — Assessment & Plan Note (Signed)
 Given a rotator cuff HEP Avoid elevation and ER Try arnica gel over shoulder Reck post 6 weeks

## 2023-12-17 NOTE — Progress Notes (Signed)
 PCP: Ransom Byers, MD  Subjective:   HPI: Patient is a 38 y.o. female with hEDS here for left shoulder pain and hand weakness.  She states that 2-3 months ago she began having pain in her left shoulder. She states that last month she was laying on her side when she felt her shoulder sublux, resulting in significant worsening of her pain. Since then, she has had reduced ROM of her shoulder due to pain and has struggled to sleep due to pain. She has tried flexeril  and advil with minimal relief. She has never dislocated her shoulder nor struggled with dislocation with her EDS. She does not do any dedicated exercises to strengthen her shoulder.   Her hands have also been bothering her for the last 5 years. She states they have become gradually weaker and she is no struggling to garden. She does not have any numbness or tingling in her hands but states she has had carpal tunnel in the past.  Past Medical History:  Diagnosis Date   Angio-edema    Bulging lumbar disc    X 2   PONV (postoperative nausea and vomiting)    Urticaria     Current Outpatient Medications on File Prior to Visit  Medication Sig Dispense Refill   AVIANE 0.1-20 MG-MCG tablet Take 1 tablet by mouth daily.     cetirizine (ZYRTEC) 10 MG tablet Take 10 mg by mouth daily. In the morning (Patient not taking: Reported on 11/24/2023)     cimetidine  (TAGAMET ) 400 MG tablet Take 1 tablet (400 mg total) by mouth 2 (two) times daily. (Patient not taking: Reported on 11/24/2023) 60 tablet 6   cyclobenzaprine  (FLEXERIL ) 10 MG tablet Take 1 tablet (10 mg total) by mouth 3 (three) times daily as needed for muscle spasms. 60 tablet 1   EPINEPHrine  (EPIPEN  2-PAK) 0.3 mg/0.3 mL IJ SOAJ injection Inject 0.3 mg into the muscle as needed for anaphylaxis. 0.3 mL 1   lisdexamfetamine (VYVANSE) 20 MG capsule Take 20 mg by mouth daily as needed.     omalizumab  (XOLAIR ) 300 MG/2  ML prefilled syringe Inject 300 mg into the skin every 14  (fourteen) days. 4 mL 11   pindolol  (VISKEN ) 5 MG tablet Take 1 tablet by mouth twice daily 180 tablet 3   traMADol  (ULTRAM ) 50 MG tablet Take 1-2 tabs every 12 hours as needed for pain 60 tablet 3   ZEPBOUND 15 MG/0.5ML Pen Inject 15 mg into the skin once a week.     Current Facility-Administered Medications on File Prior to Visit  Medication Dose Route Frequency Provider Last Rate Last Admin   omalizumab  (XOLAIR ) prefilled syringe 300 mg  300 mg Subcutaneous Q28 days Rochester Chuck, MD   300 mg at 04/29/23 0930    Past Surgical History:  Procedure Laterality Date   HIP ARTHROSCOPY Left 07/28/2019   HYSTEROSCOPY N/A 11/06/2017   Procedure: HYSTEROSCOPY;  Surgeon: Ozan, Jennifer, DO;  Location: [facility];  Service: Gynecology;  Laterality: N/A;   IUD REMOVAL N/A 11/06/2017   Procedure: INTRAUTERINE DEVICE (IUD) REMOVAL;  Surgeon: Ozan, Jennifer, DO;  Location: [facility];  Service: Gynecology;  Laterality: N/A;   LEEP  2013   TONSILLECTOMY  2005   WISDOM TOOTH EXTRACTION  2004    Allergies  Allergen Reactions   Ciprofloxacin Other (See Comments)    BP 98/64   Ht 5\' 6"  (1.676 m)   Wt 135 lb (61.2 kg)   BMI 21.79 kg/m  04/05/2020    2:50 PM  [facility] Exercise  Frequency of aerobic exercise (# of days/week) 0  Average time in minutes 0  Frequency of strengthening activities (# of days/week) 0        No data to display              Objective:  Physical Exam:  Gen: NAD, comfortable in exam room  BP 98/64   Ht 5\' 6"  (1.676 m)   Wt 135 lb (61.2 kg)   BMI 21.79 kg/m    MSK: Shoulder Inspection: no deformities or bruising noted Palpation: no tenderness to shoulder girdle, clavicle, scapula ROM: full with pain past 90 degrees in flexion and abduction Strength: 5/5 to abduction, flexion, IR/ER Special Tests: Neers positive, Hawkins positive, apprehension anteriorly Neg OBrian No sulcus sign  Hand Inspection: No bruises, swelling.   Strength: Digit flexion and abduction 5/5, Hallux flexion 5/5 Special Tests: Modified Phalen Positive bilaterally  MSK Limited shoulder ultrasound performed Date: 12/17/2023 Indication: Shoulder pain with subluxation Findings: Biceps tendon visualized in long and short axis with no abnormality. Subscapular visualized in both long and short axis without abnormality. Supraspinatus Tendon is visualized in long and short axis without abnormality. Infraspinatus visualized with no abnormality. Acromioclavicular joint visualized with increased joint spacing but otherwise no abnormalities.  Subdeltoid bursa without distention   IMPRESSION:  No evidence of rotator cuff tear or damage to the biceps tendon. Suspect increased AC joint spacing is due to hyperextensibility from EDS  U/S images and interpretation completed by Glorine Laroche, MD Images saved to PACS    Assessment & Plan:  Patient is a 38 y.o. female here for left shoulder pain and bilateral hand weakness.  1. Subacromial impingement Positive Neers, Hawkers, and painful arch all indicate subacromial impingement, especially with unremarkable shoulder ultrasound. Likely a component of rotator cuff muscle weakness and hypermobility to her symptomology.  - Topical arnica cream to shoulder as needed - Home exercises to strengthen rotator cuff  2. Bilateral Carpal Tunnel  Hand weakness, positive phalen, and history of carpal tunnel all make this the most likely diagnosis.  - Hand splinting at night - Will follow-up in 6-8 weeks to reassess   Elnoria Hails, [facility]

## 2023-12-17 NOTE — Assessment & Plan Note (Signed)
 We will start with using night splints bilat. If sxs worsen I want to US  and consider hydrodissection

## 2023-12-17 NOTE — Patient Instructions (Signed)
 You can use arnica cream/gel to the shoulder as needed. Do the exercises we gave you. Use the wrist braces at nighttime to help with your carpal tunnel. Follow up in 6-8 weeks to recheck.  Call with questions in the meantime.

## 2024-01-27 ENCOUNTER — Encounter: Payer: Self-pay | Admitting: Obstetrics and Gynecology

## 2024-01-27 ENCOUNTER — Ambulatory Visit (INDEPENDENT_AMBULATORY_CARE_PROVIDER_SITE_OTHER): Admitting: Obstetrics and Gynecology

## 2024-01-27 VITALS — BP 104/68 | HR 106 | Ht 65.75 in | Wt 138.0 lb

## 2024-01-27 DIAGNOSIS — Q7962 Hypermobile Ehlers-Danlos syndrome: Secondary | ICD-10-CM | POA: Diagnosis not present

## 2024-01-27 DIAGNOSIS — N946 Dysmenorrhea, unspecified: Secondary | ICD-10-CM

## 2024-01-27 DIAGNOSIS — N921 Excessive and frequent menstruation with irregular cycle: Secondary | ICD-10-CM

## 2024-01-27 NOTE — Progress Notes (Unsigned)
 GYNECOLOGY  VISIT   HPI: 38 y.o.   Single  Caucasian female   [id number] with Patient's last menstrual period was 10/27/2023 (exact date).   here for: New GYN- Irregular and heavy menses. Had 3 colpos and 1 leep in the past. Hx of abnormal paps.     10/27/23 had breakthrough bleeding on her birth control pills.  She stopped her pills. Doubled up on pills for 3 days, starting day 8.  After 16 days of her cycle, she was seen by NP at [facility]. Took progesterone course tid x 6 days and she spotted through this. When she stopped progesterone, her bleeding returned.   She had blood work in April, and this was normal per patient.   She had a normal pelvic ultrasound.   Ablation was suggested, and not performed.   Always has had heavy and menses since her teen age years.  Menses usually last 7 - 9 days.   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  Has a cycle 4 - 5 times a year.  When she has a menstruation, she may feel like she is going to pass out.    On Zepbound for 1.5 years and had not had heavy bleeding.   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   Hx Mirena  IUD use.  Had continuous bleeding, and it was imbedded and had to be surgically removed.   Is leaning toward not having children.   Has a PhD in public health.  GYNECOLOGIC HISTORY: Patient's last menstrual period was 10/27/2023 (exact date). Contraception:  OCP Menopausal hormone therapy:  n/a Last 2 paps:  02/2023.  Paps normal since LEEP.   History of abnormal Pap or positive HPV:  yes Mammogram:  n/a        OB History     Gravida  0   Para  0   Term  0   Preterm  0   AB  0   Living  0      SAB  0   IAB  0   Ectopic  0   Multiple  0   Live Births  0              Patient Active Problem List   Diagnosis Date Noted   Acute pain of left shoulder 12/17/2023   Carpal tunnel syndrome 12/17/2023   Acute low back pain with disc symptoms, duration less than 6 weeks 11/20/2022   Obesity 10/22/2021    Acute bronchitis 09/24/2021   Thyroid  function study abnormality 04/23/2021   Weight gain 04/10/2021   Hives 11/13/2020   Orthostatic intolerance 05/28/2020   Dysautonomia (HCC) 05/28/2020   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 01/17/2020   POTS (postural orthostatic tachycardia syndrome) 01/17/2020   Ehlers-Danlos syndrome type III 09/06/2019    Past Medical History:  Diagnosis Date   Angio-edema    Bulging lumbar disc    X 2   PONV (postoperative nausea and vomiting)    Urticaria     Past Surgical History:  Procedure Laterality Date   HIP ARTHROSCOPY Left 07/28/2019   HYSTEROSCOPY N/A 11/06/2017   Procedure: HYSTEROSCOPY;  Surgeon: Marilynn Nest, DO;  Location: [facility];  Service: Gynecology;  Laterality: N/A;   IUD REMOVAL N/A 11/06/2017   Procedure: INTRAUTERINE DEVICE (IUD) REMOVAL;  Surgeon: Ozan, Jennifer, DO;  Location: [facility];  Service: Gynecology;  Laterality: N/A;   LEEP  2013   TONSILLECTOMY  2005   WISDOM TOOTH EXTRACTION  2004  Current Outpatient Medications  Medication Sig Dispense Refill   AVIANE 0.1-20 MG-MCG tablet Take 1 tablet by mouth daily.     B-D 3CC LUER-LOK SYR 25GX1 25G X 1 3 ML MISC USE TO ADMINISTER VITAMIN B12 INJECTIONS     cimetidine  (TAGAMET ) 400 MG tablet Take 1 tablet (400 mg total) by mouth 2 (two) times daily. 60 tablet 6   cyanocobalamin (VITAMIN B12) 1000 MCG/ML injection INJECT 1 ML INTO THE MUSCLE ONCE EVERY MONTH FOR 30 DAYS     cyclobenzaprine  (FLEXERIL ) 10 MG tablet Take 1 tablet (10 mg total) by mouth 3 (three) times daily as needed for muscle spasms. 60 tablet 1   EPINEPHrine  (EPIPEN  2-PAK) 0.3 mg/0.3 mL IJ SOAJ injection Inject 0.3 mg into the muscle as needed for anaphylaxis. 0.3 mL 1   lisdexamfetamine (VYVANSE) 20 MG capsule Take 20 mg by mouth daily as needed.     omalizumab  (XOLAIR ) 300 MG/2  ML prefilled syringe Inject 300 mg into the skin every 14 (fourteen) days. 4 mL 11   pindolol  (VISKEN ) 5 MG tablet  Take 1 tablet by mouth twice daily 180 tablet 3   traMADol  (ULTRAM ) 50 MG tablet Take 1-2 tabs every 12 hours as needed for pain 60 tablet 3   triamcinolone  cream (KENALOG) 0.1 % Apply topically 2 (two) times daily as needed.     ZEPBOUND 15 MG/0.5ML Pen Inject 15 mg into the skin once a week.     Current Facility-Administered Medications  Medication Dose Route Frequency Provider Last Rate Last Admin   omalizumab  (XOLAIR ) prefilled syringe 300 mg  300 mg Subcutaneous Q28 days Iva Marty Saltness, MD   300 mg at 04/29/23 0930     ALLERGIES: Ciprofloxacin  Family History  Problem Relation Age of Onset   Heart disease Mother    CAD Mother    Diabetes Mother    Endometriosis Mother     Social History   Socioeconomic History   Marital status: Single    Spouse name: Not on file   Number of children: Not on file   Years of education: Not on file   Highest education level: Not on file  Occupational History   Not on file  Tobacco Use   Smoking status: Never   Smokeless tobacco: Never  Vaping Use   Vaping status: Never Used  Substance and Sexual Activity   Alcohol use: Not Currently   Drug use: Never   Sexual activity: Not Currently    Partners: Male    Birth control/protection: OCP  Other Topics Concern   Not on file  Social History Narrative   Works at [employer] Strain: Not on [employer] Insecurity: Not on file  Transportation Needs: Not on file  Physical Activity: Not on file  Stress: Not on file  Social Connections: Not on file  Intimate Partner Violence: Not on file    Review of Systems  All other systems reviewed and are negative.   PHYSICAL EXAMINATION:   BP 104/68 (BP Location: Left Arm, Patient Position: Sitting)   Pulse (!) 106   Ht 5' 5.75 (1.67 m)   Wt 138 lb (62.6 kg)   LMP 10/27/2023 (Exact Date)   SpO2 98%   BMI 22.44 kg/m     General appearance: alert, cooperative and appears stated age Head:  Normocephalic, without obvious abnormality, atraumatic Neck: no adenopathy, supple, symmetrical, trachea midline and thyroid  normal to inspection and palpation Lungs:  clear to auscultation bilaterally Breasts: normal appearance, no masses or tenderness, No nipple retraction or dimpling, No nipple discharge or bleeding, No axillary or supraclavicular adenopathy Heart: regular rate and rhythm Abdomen: soft, non-tender, no masses,  no organomegaly Extremities: extremities normal, atraumatic, no cyanosis or edema Skin: Skin color, texture, turgor normal. No rashes or lesions Lymph nodes: Cervical, supraclavicular, and axillary nodes normal. No abnormal inguinal nodes palpated Neurologic: Grossly normal  Pelvic: External genitalia:  no lesions              Urethra:  normal appearing urethra with no masses, tenderness or lesions              Bartholins and Skenes: normal                 Vagina: normal appearing vagina with normal color and discharge, no lesions              Cervix: no lesions                Bimanual Exam:  Uterus:  normal size, contour, position, consistency, mobility, non-tender              Adnexa: no mass, fullness, tenderness              Rectal exam: {yes no:314532}.  Confirms.              Anus:  normal sphincter tone, no lesions  Chaperone was present for exam:  {BSCHAPERONE:31226::Alandra F, CMA}  ASSESSMENT:  Hx LEEP.   Mast cell activation. Potts.  Ehler Danlos.  PLAN:    {LABS (Optional):23779}  ***  total time was spent for this patient encounter, including preparation, face-to-face counseling with the patient, coordination of care, and documentation of the encounter.

## 2024-01-28 ENCOUNTER — Ambulatory Visit: Admitting: Sports Medicine

## 2024-01-28 VITALS — BP 108/82 | Ht 66.0 in | Wt 135.0 lb

## 2024-01-28 DIAGNOSIS — M7542 Impingement syndrome of left shoulder: Secondary | ICD-10-CM | POA: Diagnosis not present

## 2024-01-28 MED ORDER — METHYLPREDNISOLONE ACETATE 40 MG/ML IJ SUSP
40.0000 mg | Freq: Once | INTRAMUSCULAR | Status: AC
  Administered 2024-01-28: 40 mg via INTRA_ARTICULAR

## 2024-01-28 NOTE — Progress Notes (Signed)
 PCP: Chrystal Lamarr RAMAN, MD  Subjective:   HPI: Patient is a 38 y.o. female here for follow-up of bilateral hand weakness and left lateral shoulder pain.  She was last evaluated 6/5 and given night splints for suspected bilateral carpal tunnel syndrome.  Left shoulder pain was attributed to subacromial impingement.  Treated with topical Arnica cream and home PT exercises.  6-8-week follow-up arranged for reassessment.  Today she endorses 50% improvement in her left shoulder. She no longer experiences pain at rest but still experiences pain with overhead movements. She says her range of motion is improving. Discomfort at night has diminished. Arnica cream was ineffective. Dr. Goldie states that the weakness in her hand is essentially unchanged. Night splints felt 'claustrophobic'. She is less concerned with her hands today and is chiefly concerned about lingering pain in her left shoulder. She is interested in a corticosteroid injection.   Past Medical History:  Diagnosis Date   Angio-edema    Bulging lumbar disc    X 2   PONV (postoperative nausea and vomiting)    Urticaria     Current Outpatient Medications on File Prior to Visit  Medication Sig Dispense Refill   AVIANE 0.1-20 MG-MCG tablet Take 1 tablet by mouth daily.     B-D 3CC LUER-LOK SYR 25GX1 25G X 1 3 ML MISC USE TO ADMINISTER VITAMIN B12 INJECTIONS     cimetidine  (TAGAMET ) 400 MG tablet Take 1 tablet (400 mg total) by mouth 2 (two) times daily. 60 tablet 6   cyanocobalamin (VITAMIN B12) 1000 MCG/ML injection INJECT 1 ML INTO THE MUSCLE ONCE EVERY MONTH FOR 30 DAYS     cyclobenzaprine  (FLEXERIL ) 10 MG tablet Take 1 tablet (10 mg total) by mouth 3 (three) times daily as needed for muscle spasms. 60 tablet 1   EPINEPHrine  (EPIPEN  2-PAK) 0.3 mg/0.3 mL IJ SOAJ injection Inject 0.3 mg into the muscle as needed for anaphylaxis. 0.3 mL 1   lisdexamfetamine (VYVANSE) 20 MG capsule Take 20 mg by mouth daily as needed.     omalizumab   (XOLAIR ) 300 MG/2  ML prefilled syringe Inject 300 mg into the skin every 14 (fourteen) days. 4 mL 11   pindolol  (VISKEN ) 5 MG tablet Take 1 tablet by mouth twice daily 180 tablet 3   traMADol  (ULTRAM ) 50 MG tablet Take 1-2 tabs every 12 hours as needed for pain 60 tablet 3   triamcinolone  cream (KENALOG) 0.1 % Apply topically 2 (two) times daily as needed.     ZEPBOUND 15 MG/0.5ML Pen Inject 15 mg into the skin once a week.     Current Facility-Administered Medications on File Prior to Visit  Medication Dose Route Frequency Provider Last Rate Last Admin   omalizumab  (XOLAIR ) prefilled syringe 300 mg  300 mg Subcutaneous Q28 days Iva Marty Saltness, MD   300 mg at 04/29/23 0930    Past Surgical History:  Procedure Laterality Date   HIP ARTHROSCOPY Left 07/28/2019   HYSTEROSCOPY N/A 11/06/2017   Procedure: HYSTEROSCOPY;  Surgeon: Ozan, Jennifer, DO;  Location: [facility];  Service: Gynecology;  Laterality: N/A;   IUD REMOVAL N/A 11/06/2017   Procedure: INTRAUTERINE DEVICE (IUD) REMOVAL;  Surgeon: Ozan, Jennifer, DO;  Location: [facility];  Service: Gynecology;  Laterality: N/A;   LEEP  2013   TONSILLECTOMY  2005   WISDOM TOOTH EXTRACTION  2004    Allergies  Allergen Reactions   Ciprofloxacin Other (See Comments)    LMP 10/27/2023 (Exact Date)      04/05/2020  2:50 PM  [facility] Adult Exercise  Frequency of aerobic exercise (# of days/week) 0  Average time in minutes 0  Frequency of strengthening activities (# of days/week) 0        No data to display              Objective:  Physical Exam:  Gen: NAD, comfortable in exam room  Left Shoulder No obvious deformities on inspection No TTP She endorses pain with abduction beyond 110 degrees and flexion approaching 165 degrees. No pain is elicited with resisted IR/ER Positive Hawkins / Empty Can / Neer's Negative Obrien's Negative sulcus sign  Assessment & Plan:  1.  Subacromial impingement, left  shoulder She endorses 50% improvement with regards to pain in the left lateral shoulder. Continues to endorse difficulty with overhead movement / activity. Objectively, range of motion has mildly improved.  Hawkins, empty can, and Neer's tests remain positive.  She is interested in a corticosteroid injection for further improvement.   -Subacromial corticosteroid injection administered today.  See procedure note below.   -Recommend continuing home physical therapy exercises as they seem to be working well. -Follow-up as needed

## 2024-01-28 NOTE — Patient Instructions (Signed)
Ehlers-Danlos Syndrome Ehlers-Danlos syndrome is a group of genetic disorders that affect the body's connective tissues, which are made up of proteins. Connective tissues give support and elasticity to the skin, bones, joints, blood vessels, and internal organs. These disorders cause connective tissues to become weak, fragile, and stretchable. People with these disorders often have overly flexible joints and stretchy, fragile skin. Ehlers-Danlos syndrome has 13 types, but many of those are rare. The most common types affect joints and skin. Other types affect the eyes, spine, or heart valves. The most severe types affect the walls of blood vessels and the structures of internal organs. What are the causes? This condition is caused by abnormal genes (genetic defects). Some types are passed from parent to child (inherited), and others are not. Sometimes, the genes can become defective during development in the womb. What are the signs or symptoms? The signs and symptoms of this condition depend on the type. Signs and symptoms may start in childhood or later in life. Signs and symptoms of the common types include: Joint and muscle symptoms Overly flexible joints that have a wide range of motion and can become displaced easily. Joint pain. Weak, floppy muscles. Skin symptoms Velvety, soft skin that stretches and sags. Skin that bruises and tears easily. Skin tears that are hard to repair with stitches (sutures) and that heal with wide scars. Lumps under the skin. Vision symptoms Tearing of fibers of the eyeball or tearing of the tissue inside the eye (retinal detachment). Changes in vision, including seeing floaters or flashes, or having sudden vision loss. Vascular symptoms Bleeding from tears of internal organs or blood vessels. Weak heart valves causing weakening of the heart. Orthopedic symptoms A curved spine, which can cause trouble breathing. Bowed limbs and short stature. Muscle or  tendon tears. Weak, thin bones. A club foot. Other symptoms A collapsed lung. Unusual facial features, such as a thin nose and lips, sunken cheeks, small earlobes, and prominent eyes. How is this diagnosed? This condition may be diagnosed based on symptoms and medical history, especially if the condition runs in your family. Your health care provider will also do a physical exam. You may also have tests, such as: Blood tests to check for genetic defects. Tissue tests to check for abnormal body proteins (collagen). X-rays of your spine. Imaging of internal organs, such as an MRI or a CT scan. A bone scan. Heart ultrasound (echocardiogram) to look for heart valve problems. How is this treated? There is no cure for this condition, but treatment can help symptoms and prevent complications. Treatment depends on the type of condition you have and the symptoms it causes. Treatment options can include: Over-the-counter pain medicines. Vitamin C to strengthen skin. Vitamin D and calcium to strengthen bones. Blood pressure medicines to prevent blood vessel tears. Physical therapy exercises to improve movement and strength in your muscles and joints. Assistive devices that help you move, such as braces or a wheelchair, a walker, or a scooter. Surgery to repair wounds, tears, or injured joints. Follow these instructions at home: Activity Do exercises as told by your health care provider. Return to your normal activities as told by your health care provider. Ask your health care provider what activities are safe for you. You may need to avoid doing contact sports and heavy lifting. General instructions  Take over-the-counter and prescription medicines only as told by your health care provider. Let your health care provider know if you become pregnant. You will need special care to prevent  tearing of the womb. Tell all health care providers that you have this condition before you have any surgery  or procedure. Wear a medical alert bracelet. Schedule eye exams often. Consider having genetic counseling to learn if you may pass the condition on to your children. Do not use any products that contain nicotine or tobacco. These products include cigarettes, chewing tobacco, and vaping devices, such as e-cigarettes. If you need help quitting, ask your health care provider. Keep all follow-up visits. This is important. Where to find more information National Organization for Rare Disorders: rarediseases.org Contact a health care provider if: You have joint pain. You cut or tear your skin. You have weakness or shortness of breath. You have any new symptoms. Get help right away if you have: Any sudden and severe pain. Chest pain or trouble breathing. Sudden vision loss, or you see floaters or flashes. These symptoms may represent a serious problem that is an emergency. Do not wait to see if the symptoms will go away. Get medical help right away. Call your local emergency services (911 in the [place] Do not drive yourself to the hospital. Summary Ehlers-Danlos syndrome is a group of genetic disorders that affect the connective tissues of the body. This condition is caused by abnormal genes (genetic defects). Symptoms range from overly flexible joints and fragile skin to very serious complications, such as blood vessel or organ tears. There is no cure for this condition, but treatment can relieve some symptoms and prevent complications. This information is not intended to replace advice given to you by your health care provider. Make sure you discuss any questions you have with your health care provider. Document Revised: 10/10/2020 Document Reviewed: 10/10/2020 Elsevier Patient Education  2024 [employer].

## 2024-03-08 ENCOUNTER — Encounter: Payer: Self-pay | Admitting: Obstetrics and Gynecology

## 2024-03-08 ENCOUNTER — Ambulatory Visit (INDEPENDENT_AMBULATORY_CARE_PROVIDER_SITE_OTHER): Admitting: Obstetrics and Gynecology

## 2024-03-08 ENCOUNTER — Ambulatory Visit (INDEPENDENT_AMBULATORY_CARE_PROVIDER_SITE_OTHER)

## 2024-03-08 VITALS — BP 114/82 | HR 106

## 2024-03-08 DIAGNOSIS — Z5181 Encounter for therapeutic drug level monitoring: Secondary | ICD-10-CM | POA: Diagnosis not present

## 2024-03-08 DIAGNOSIS — N921 Excessive and frequent menstruation with irregular cycle: Secondary | ICD-10-CM | POA: Diagnosis not present

## 2024-03-08 DIAGNOSIS — N939 Abnormal uterine and vaginal bleeding, unspecified: Secondary | ICD-10-CM

## 2024-03-08 DIAGNOSIS — N946 Dysmenorrhea, unspecified: Secondary | ICD-10-CM

## 2024-03-08 DIAGNOSIS — Q796 Ehlers-Danlos syndrome, unspecified: Secondary | ICD-10-CM

## 2024-03-08 MED ORDER — LEVONORGEST-ETH ESTRAD 91-DAY 0.15-0.03 MG PO TABS
1.0000 | ORAL_TABLET | Freq: Every day | ORAL | 2 refills | Status: DC
Start: 2024-03-08 — End: 2024-05-23

## 2024-03-08 NOTE — Progress Notes (Unsigned)
 GYNECOLOGY  VISIT   HPI: 38 y.o.   Single  Caucasian female   G0P0000 with No LMP recorded. (Menstrual status: Oral contraceptives).   here for: US  Consult      Period in July was a really normal period for her.    Her abnormal periods do not happen often, but they can last longer.    On this pill for 10 years.  Has occasional heavy menses.   Avoids her period to avoid POTS symptoms.   Asking about evaluation for endometriosis.   GYNECOLOGIC HISTORY: No LMP recorded. (Menstrual status: Oral contraceptives). Contraception:  OCP Menopausal hormone therapy:  n/a Last 2 paps:  02/2023.  Paps normal since LEEP.  History of abnormal Pap or positive HPV:  yes Mammogram:  n/a        OB History     Gravida  0   Para  0   Term  0   Preterm  0   AB  0   Living  0      SAB  0   IAB  0   Ectopic  0   Multiple  0   Live Births  0              Patient Active Problem List   Diagnosis Date Noted   Acute pain of left shoulder 12/17/2023   Carpal tunnel syndrome 12/17/2023   Acute low back pain with disc symptoms, duration less than 6 weeks 11/20/2022   Obesity 10/22/2021   Acute bronchitis 09/24/2021   Thyroid  function study abnormality 04/23/2021   Weight gain 04/10/2021   Hives 11/13/2020   Orthostatic intolerance 05/28/2020   Dysautonomia (HCC) 05/28/2020   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 01/17/2020   POTS (postural orthostatic tachycardia syndrome) 01/17/2020   Ehlers-Danlos syndrome type III 09/06/2019    Past Medical History:  Diagnosis Date   Angio-edema    Bulging lumbar disc    X 2   PONV (postoperative nausea and vomiting)    Urticaria     Past Surgical History:  Procedure Laterality Date   HIP ARTHROSCOPY Left 07/28/2019   HYSTEROSCOPY N/A 11/06/2017   Procedure: HYSTEROSCOPY;  Surgeon: Marilynn Nest, DO;  Location: [facility];  Service: Gynecology;  Laterality: N/A;   IUD REMOVAL N/A 11/06/2017   Procedure:  INTRAUTERINE DEVICE (IUD) REMOVAL;  Surgeon: Ozan, Jennifer, DO;  Location: [facility];  Service: Gynecology;  Laterality: N/A;   LEEP  2013   TONSILLECTOMY  2005   WISDOM TOOTH EXTRACTION  2004    Current Outpatient Medications  Medication Sig Dispense Refill   AVIANE 0.1-20 MG-MCG tablet Take 1 tablet by mouth daily.     B-D 3CC LUER-LOK SYR 25GX1 25G X 1 3 ML MISC USE TO ADMINISTER VITAMIN B12 INJECTIONS     cimetidine  (TAGAMET ) 400 MG tablet Take 1 tablet (400 mg total) by mouth 2 (two) times daily. 60 tablet 6   cyanocobalamin (VITAMIN B12) 1000 MCG/ML injection INJECT 1 ML INTO THE MUSCLE ONCE EVERY MONTH FOR 30 DAYS     cyclobenzaprine  (FLEXERIL ) 10 MG tablet Take 1 tablet (10 mg total) by mouth 3 (three) times daily as needed for muscle spasms. 60 tablet 1   EPINEPHrine  (EPIPEN  2-PAK) 0.3 mg/0.3 mL IJ SOAJ injection Inject 0.3 mg into the muscle as needed for anaphylaxis. 0.3 mL 1   lisdexamfetamine (VYVANSE) 20 MG capsule Take 20 mg by mouth daily as needed.     omalizumab  (XOLAIR ) 300  MG/2  ML prefilled syringe Inject 300 mg into the skin every 14 (fourteen) days. 4 mL 11   pindolol  (VISKEN ) 5 MG tablet Take 1 tablet by mouth twice daily 180 tablet 3   traMADol  (ULTRAM ) 50 MG tablet Take 1-2 tabs every 12 hours as needed for pain 60 tablet 3   triamcinolone  cream (KENALOG) 0.1 % Apply topically 2 (two) times daily as needed.     ZEPBOUND 15 MG/0.5ML Pen Inject 15 mg into the skin once a week.     Current Facility-Administered Medications  Medication Dose Route Frequency Provider Last Rate Last Admin   omalizumab  (XOLAIR ) prefilled syringe 300 mg  300 mg Subcutaneous Q28 days Iva Marty Saltness, MD   300 mg at 04/29/23 0930     ALLERGIES: Ciprofloxacin  Family History  Problem Relation Age of Onset   Heart disease Mother    CAD Mother    Diabetes Mother    Endometriosis Mother     Social History   Socioeconomic History   Marital status: Single    Spouse name: Not on  file   Number of children: Not on file   Years of education: Not on file   Highest education level: Not on file  Occupational History   Not on file  Tobacco Use   Smoking status: Never   Smokeless tobacco: Never  Vaping Use   Vaping status: Never Used  Substance and Sexual Activity   Alcohol use: Not Currently   Drug use: Never   Sexual activity: Not Currently    Partners: Male    Birth control/protection: OCP  Other Topics Concern   Not on file  Social History Narrative   Works at [employer] Strain: Not on [employer] Insecurity: Not on file  Transportation Needs: Not on file  Physical Activity: Not on file  Stress: Not on file  Social Connections: Not on file  Intimate Partner Violence: Not on file    Review of Systems  All other systems reviewed and are negative.   PHYSICAL EXAMINATION:   BP 114/82 (BP Location: Left Arm, Patient Position: Sitting)   Pulse (!) 106   SpO2 98%     General appearance: alert, cooperative and appears stated age   Pelvic US    ASSESSMENT:  Hx menorrhagia with irregular menses.  Dysmenorrhea. On continuous combined oral contraception with self directed withdrawal.  Mertha Bullock.  POTS. Mast cell activation.   PLAN:  We discussed breakthrough bleeding on continuous contraception and that this can occur more often on a low dose option.  I also think that her Mertha Bullock may have a vascular component to it that contributes to her periodic heavy and prolonged menstruation.  Switch to [place].   Annual exam   {LABS (Optional):23779}  ***  total time was spent for this patient encounter, including preparation, face-to-face counseling with the patient, coordination of care, and documentation of the encounter.

## 2024-05-23 ENCOUNTER — Encounter: Payer: Self-pay | Admitting: Obstetrics and Gynecology

## 2024-05-23 ENCOUNTER — Other Ambulatory Visit: Payer: Self-pay | Admitting: Obstetrics and Gynecology

## 2024-05-23 MED ORDER — LEVONORGEST-ETH ESTRAD 91-DAY 0.15-0.03 MG PO TABS: 1.0000 | ORAL_TABLET | Freq: Every day | ORAL | 0 refills | Status: AC

## 2024-05-26 ENCOUNTER — Encounter: Payer: Self-pay | Admitting: Allergy & Immunology

## 2024-05-26 ENCOUNTER — Ambulatory Visit: Admitting: Allergy & Immunology

## 2024-05-26 VITALS — BP 98/66 | HR 84 | Temp 97.9°F | Ht 65.0 in | Wt 139.2 lb

## 2024-05-26 DIAGNOSIS — J3089 Other allergic rhinitis: Secondary | ICD-10-CM | POA: Diagnosis not present

## 2024-05-26 DIAGNOSIS — L501 Idiopathic urticaria: Secondary | ICD-10-CM | POA: Diagnosis not present

## 2024-05-26 DIAGNOSIS — T63481D Toxic effect of venom of other arthropod, accidental (unintentional), subsequent encounter: Secondary | ICD-10-CM

## 2024-05-26 DIAGNOSIS — G90A Postural orthostatic tachycardia syndrome (POTS): Secondary | ICD-10-CM | POA: Diagnosis not present

## 2024-05-26 DIAGNOSIS — J302 Other seasonal allergic rhinitis: Secondary | ICD-10-CM

## 2024-05-26 MED ORDER — EPINEPHRINE 0.3 MG/0.3ML IJ SOAJ: 0.3000 mg | INTRAMUSCULAR | 1 refills | Status: AC | PRN

## 2024-05-26 NOTE — Progress Notes (Signed)
 FOLLOW UP  Date of Service/Encounter:  05/26/24   Assessment:   Seasonal and perennial allergic rhinitis (grasses, ragweed, weeds, trees, indoor and outdoor molds, dust mite, cat, dog) - with minimal symptoms   POTS (postural orthostatic tachycardia syndrome)   Ehlers-Danlos syndrome type III   Mast cell activation syndrome    Chronic urticaria - controlled with Xolair  monthly   Food sensitizations (red meat, peanuts, tree nuts)   Insect sting allergy  (yellowjacket, wasp)   Enlarged cervical lymph node around April  Plan/Recommendations:   1. Seasonal and perennial allergic rhinitis - with postprandial sneezing - Testing initially showed: grasses, ragweed, weeds, trees, indoor molds, outdoor molds, dust mites, cat, and dog - You can use an extra dose of the antihistamine, if needed, for breakthrough symptoms.  - Continue with Xolair  every 2 weeks like you are doing.   2. POTS (postural orthostatic tachycardia syndrome) in combination with EDS and likely mast cell activation syndrome - Continue with Xolair  every two weeks.  - I am glad that the MCAS symptoms are better.   3. Stinging insect allergy  - EpiPen  is up to date.   4. Return in about 6 months (around 11/23/2024). You can have the follow up appointment with Dr. Iva or a Nurse Practicioner (our Nurse Practitioners are excellent and always have Physician oversight!).    Subjective:   Betty Lewis is a 38 y.o. female presenting today for follow up of  Chief Complaint  Patient presents with   Urticaria   Seasonal and perennial allergic rhinitis   Follow-up    No concerns today   Medication Refill    Betty Lewis has a history of the following: Patient Active Problem List   Diagnosis Date Noted   Acute pain of left shoulder 12/17/2023   Carpal tunnel syndrome 12/17/2023   Acute low back pain with disc symptoms, duration less than 6 weeks 11/20/2022   Obesity 10/22/2021   Acute bronchitis  09/24/2021   Thyroid  function study abnormality 04/23/2021   Weight gain 04/10/2021   Hives 11/13/2020   Orthostatic intolerance 05/28/2020   Dysautonomia (HCC) 05/28/2020   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  Discussed the use of AI scribe software for clinical note transcription with the patient and/or guardian, who gave verbal consent to proceed.  Betty Lewis is a 38 y.o. female presenting for a follow up visit.  She was last seen in May 2025.  At that time, she was doing well with her urticaria.  She was doing her Xolair  every month, although we did talk about increasing it to every 3 weeks.  We talked about Dupixent as well, but we felt that the Xolair  would help more with preventing anaphylaxis.  For her stinging insect allergy , EpiPen  was up-to-date.  Since last visit, she has done fairly well.   Allergic Rhinitis Symptom History: She does have sneezing after dinner only. She has a flushing between 3pm and 4pm daily. Other than this, everything has been fine.  Skin Symptom History: Her mast cell symptoms have significantly improved with Xolair , administered biweekly. She experiences sneezing episodes after dinner lasting about an hour and flushing between 3 and 4 PM, which resolves spontaneously. She has discontinued all antihistamines, including Zyrtec, Tagamet , and Singulair .   She reports that pindolol  is effective for her POTS and does not cause  side effects, unlike previous medications such as propranolol  and metoprolol . She does not believe Xolair  affects this condition. She sees Cardiology, but is looking for another 1.  She previously saw Dr. Fernande who has since retired.  She has a history of Ehlers-Danlos Syndrome and uses tramadol  daily for pain management. Diclofenac is the only effective  anti-inflammatory for her condition. Recently, she sustained a back injury when her dog jumped on her, for which she is taking Flexeril  as needed.  She is concerned about her health insurance costs, which have increased significantly, and is semi-unemployed due to paused federal contracts. She is exploring options for medication refills and cost management.  She has been estranged from her father for about ten years and maintains a relationship with her mother, who had strokes last spring. She moved in to help her mother, who is currently going through a divorce.  They are still planning to move to [place] , but they want to get the divorce finalized and the house sold before they leave.  She had a back injury since the last visit. Her dog jumped on her bad hip and she jumped and spasms her back. She is on Flexeril  as well as diclofenac for her back pain.  She remains on Zepbound and she is micro dosing it. This makes it last a bit longer for each injection. She has been rationing her injections to every other week.   Otherwise, there have been no changes to her past medical history, surgical history, family history, or social history.    Review of systems otherwise negative other than that mentioned in the HPI.    Objective:   Blood pressure 98/66, pulse 84, temperature 97.9 F (36.6 C), temperature source Temporal, height 5' 5 (1.651 m), weight 139 lb 3.2 oz (63.1 kg), SpO2 97%. Body mass index is 23.16 kg/m.    Physical Exam Vitals reviewed.  Constitutional:      General: She is awake.     Appearance: She is well-developed.     Comments: Pleasant.  Cooperative. Friendly.   HENT:     Head: Normocephalic and atraumatic.     Right Ear: Tympanic membrane, ear canal and external ear normal. No drainage, swelling or tenderness. Tympanic membrane is not injected, scarred, erythematous, retracted or bulging.     Left Ear: Tympanic membrane, ear canal and external ear normal. No  drainage, swelling or tenderness. Tympanic membrane is not injected, scarred, erythematous, retracted or bulging.     Nose: No nasal deformity, septal deviation, mucosal edema or rhinorrhea.     Right Turbinates: Enlarged, swollen and pale.     Left Turbinates: Enlarged, swollen and pale.     Right Sinus: No maxillary sinus tenderness or frontal sinus tenderness.     Left Sinus: No maxillary sinus tenderness or frontal sinus tenderness.     Comments: No polyps.     Mouth/Throat:     Lips: Pink.     Mouth: Mucous membranes are moist. Mucous membranes are pale and not dry.     Pharynx: Uvula midline.     Comments: Cobblestoning in the posterior oropharynx. Eyes:     General: Lids are normal.        Right eye: No discharge.        Left eye: No discharge.     Conjunctiva/sclera:     Right eye: Right conjunctiva is not injected. No chemosis.    Left eye: Left conjunctiva is not injected. No chemosis.    Pupils: Pupils  are equal, round, and reactive to light.  Cardiovascular:     Rate and Rhythm: Normal rate and regular rhythm.     Heart sounds: Normal heart sounds.  Pulmonary:     Effort: Pulmonary effort is normal. No tachypnea, accessory muscle usage or respiratory distress.     Breath sounds: Normal breath sounds. No wheezing, rhonchi or rales.     Comments: Moving air well in all lung fields. No increased work of breathing noted.  Chest:     Chest wall: No tenderness.  Abdominal:     Tenderness: There is no abdominal tenderness. There is no guarding or rebound.  Lymphadenopathy:     Head:     Right side of head: No submandibular, tonsillar or occipital adenopathy.     Left side of head: No submandibular, tonsillar or occipital adenopathy.     Cervical: No cervical adenopathy.  Skin:    General: Skin is warm.     Capillary Refill: Capillary refill takes less than 2 seconds.     Coloration: Skin is not pale.     Findings: No abrasion, erythema, petechiae or rash. Rash is not  papular, urticarial or vesicular.     Comments: Skin completely clear. No urticaria today.  Neurological:     Mental Status: She is alert.  Psychiatric:        Behavior: Behavior is cooperative.      Diagnostic studies: none       Marty Shaggy, MD  [facility] 

## 2024-05-26 NOTE — Patient Instructions (Addendum)
 1. Seasonal and perennial allergic rhinitis - with postprandial sneezing - Testing initially showed: grasses, ragweed, weeds, trees, indoor molds, outdoor molds, dust mites, cat, and dog - You can use an extra dose of the antihistamine, if needed, for breakthrough symptoms.  - Continue with Xolair  every 2 weeks like you are doing.   2. POTS (postural orthostatic tachycardia syndrome) in combination with EDS and likely mast cell activation syndrome - Continue with Xolair  every two weeks.  - I am glad that the MCAS symptoms are better.   3. Stinging insect allergy  - EpiPen  is up to date.   4. Return in about 6 months (around 11/23/2024). You can have the follow up appointment with Dr. Iva or a Nurse Practicioner (our Nurse Practitioners are excellent and always have Physician oversight!).     Please inform us  of any Emergency Department visits, hospitalizations, or changes in symptoms. Call us  before going to the ED for breathing or allergy  symptoms since we might be able to fit you in for a sick visit. Feel free to contact us  anytime with any questions, problems, or concerns.  It was a pleasure to see you again today!  Websites that have reliable patient information: 1. American Academy of Asthma, Allergy , and Immunology: www.aaaai.org 2. Food Allergy  Research and Education (FARE): foodallergy.org 3. Mothers of Asthmatics: http://www.asthmacommunitynetwork.org 4. American College of Allergy , Asthma, and Immunology: www.acaai.org      "Like" us  on Facebook and Instagram for our latest updates!      A healthy democracy works best when [employer] participate! Make sure you are registered to vote! If you have moved or changed any of your contact information, you will need to get this updated before voting! Scan the QR codes below to learn more!

## 2024-05-31 ENCOUNTER — Telehealth: Payer: Self-pay | Admitting: *Deleted

## 2024-05-31 MED ORDER — XOLAIR 300 MG/2ML ~~LOC~~ SOSY: 300.0000 mg | PREFILLED_SYRINGE | SUBCUTANEOUS | 3 refills | Status: AC

## 2024-05-31 NOTE — Telephone Encounter (Signed)
-----   Message from Marty Morton Shaggy sent at 05/26/2024  9:03 AM EST ----- She is worried about losing her insurance due to [employer]. Is there a way we can maximize the number of Xolair  deliveries for this next batch? She could even go back to monthly to stretch it out longer.

## 2024-05-31 NOTE — Telephone Encounter (Signed)
Thank you Tammy!

## 2024-05-31 NOTE — Telephone Encounter (Signed)
 Spoke to patient and advised will send 90 day Rx to [facility] unsure if they will be able to process or not but she can try to get 90 day next shipment

## 2024-06-07 ENCOUNTER — Encounter: Payer: Self-pay | Admitting: Family Medicine

## 2024-06-07 ENCOUNTER — Ambulatory Visit: Admitting: Family Medicine

## 2024-06-07 VITALS — BP 102/68 | HR 71 | Ht 66.0 in | Wt 136.4 lb

## 2024-06-07 DIAGNOSIS — E559 Vitamin D deficiency, unspecified: Secondary | ICD-10-CM

## 2024-06-07 DIAGNOSIS — Z79899 Other long term (current) drug therapy: Secondary | ICD-10-CM

## 2024-06-07 DIAGNOSIS — Q7962 Hypermobile Ehlers-Danlos syndrome: Secondary | ICD-10-CM

## 2024-06-07 DIAGNOSIS — R5382 Chronic fatigue, unspecified: Secondary | ICD-10-CM

## 2024-06-07 LAB — CBC WITH DIFFERENTIAL/PLATELET
Basophils Absolute: 0 K/uL (ref 0.0–0.1)
Basophils Relative: 0.4 % (ref 0.0–3.0)
Eosinophils Absolute: 0 K/uL (ref 0.0–0.7)
Eosinophils Relative: 0.6 % (ref 0.0–5.0)
HCT: 41.1 % (ref 36.0–46.0)
Hemoglobin: 14.1 g/dL (ref 12.0–15.0)
Lymphocytes Relative: 25.6 % (ref 12.0–46.0)
Lymphs Abs: 2 K/uL (ref 0.7–4.0)
MCHC: 34.2 g/dL (ref 30.0–36.0)
MCV: 90.1 fl (ref 78.0–100.0)
Monocytes Absolute: 0.4 K/uL (ref 0.1–1.0)
Monocytes Relative: 5.1 % (ref 3.0–12.0)
Neutro Abs: 5.3 K/uL (ref 1.4–7.7)
Neutrophils Relative %: 68.3 % (ref 43.0–77.0)
Platelets: 288 K/uL (ref 150.0–400.0)
RBC: 4.56 Mil/uL (ref 3.87–5.11)
RDW: 13 % (ref 11.5–15.5)
WBC: 7.7 K/uL (ref 4.0–10.5)

## 2024-06-07 LAB — COMPREHENSIVE METABOLIC PANEL WITH GFR
ALT: 20 U/L (ref 0–35)
AST: 17 U/L (ref 0–37)
Albumin: 4.3 g/dL (ref 3.5–5.2)
Alkaline Phosphatase: 33 U/L — ABNORMAL LOW (ref 39–117)
BUN: 29 mg/dL — ABNORMAL HIGH (ref 6–23)
CO2: 27 meq/L (ref 19–32)
Calcium: 9.3 mg/dL (ref 8.4–10.5)
Chloride: 106 meq/L (ref 96–112)
Creatinine, Ser: 0.68 mg/dL (ref 0.40–1.20)
GFR: 109.83 mL/min (ref >60.00)
Glucose, Bld: 88 mg/dL (ref 70–99)
Potassium: 4.1 meq/L (ref 3.5–5.1)
Sodium: 137 meq/L (ref 135–145)
Total Bilirubin: 0.9 mg/dL (ref 0.2–1.2)
Total Protein: 7.1 g/dL (ref 6.0–8.3)

## 2024-06-07 LAB — LIPID PANEL
Cholesterol: 237 mg/dL — ABNORMAL HIGH (ref 0–200)
HDL: 53.5 mg/dL (ref >39.00)
LDL Cholesterol: 150 mg/dL — ABNORMAL HIGH (ref 0–99)
NonHDL: 183.79
Total CHOL/HDL Ratio: 4
Triglycerides: 167 mg/dL — ABNORMAL HIGH (ref 0.0–149.0)
VLDL: 33.4 mg/dL (ref 0.0–40.0)

## 2024-06-07 LAB — TSH: TSH: 1.34 u[IU]/mL (ref 0.35–5.50)

## 2024-06-07 LAB — VITAMIN B12: Vitamin B-12: 295 pg/mL (ref 211–911)

## 2024-06-07 LAB — VITAMIN D 25 HYDROXY (VIT D DEFICIENCY, FRACTURES): VITD: 45.77 ng/mL (ref 30.00–100.00)

## 2024-06-07 MED ORDER — LISDEXAMFETAMINE DIMESYLATE 20 MG PO CAPS
20.0000 mg | ORAL_CAPSULE | Freq: Every day | ORAL | 0 refills | Status: AC | PRN
Start: 2024-06-07 — End: ?

## 2024-06-08 ENCOUNTER — Other Ambulatory Visit (HOSPITAL_COMMUNITY): Payer: Self-pay

## 2024-06-08 ENCOUNTER — Telehealth: Payer: Self-pay

## 2024-06-08 MED ORDER — CYANOCOBALAMIN 1000 MCG/ML IJ SOLN: 1000.0000 ug | INTRAMUSCULAR | 1 refills | Status: DC

## 2024-06-08 NOTE — Telephone Encounter (Signed)
 Pt was in the office yesterday and mention that her Pharmacy is telling her that a Prior Auth is needed on her Tramodol 50mg  90day script. Can this please be started. Thank you!

## 2024-06-08 NOTE — Telephone Encounter (Signed)
 Dr Prentiss I have pended a new script for ptsd Tramadol  50mg . Our PA dept reports that this medication didn't need a PA, just a new script sent to the pharmacy as the previous script expired. I was not sure what DX code to use. I have notified the pt.

## 2024-06-08 NOTE — Telephone Encounter (Signed)
 Request for P/A Rx Tramadol :        No p/a needed. I have contacted the pts pref'd pharmacy and ''A new Rx for the tramadol  needs to be sent in since the last one for a 90 day is expired''. The ost would be around /about 7.54 for thr Rx once sent in. Also please update the Rx in the pts med list as a 90 day''. The current one is old.    T/C

## 2024-06-09 MED ORDER — TRAMADOL HCL 50 MG PO TABS: ORAL_TABLET | ORAL | 0 refills | Status: DC

## 2024-06-11 DIAGNOSIS — Z7409 Other reduced mobility: Secondary | ICD-10-CM | POA: Insufficient documentation

## 2024-06-11 NOTE — Progress Notes (Unsigned)
 1. Ehlers-Danlos syndrome type III   2. Chronic fatigue   3. Medication management   4. High risk medication use   5. Vitamin D  deficiency    Meds ordered this encounter  Medications   lisdexamfetamine (VYVANSE ) 20 MG capsule    Sig: Take 1 capsule (20 mg total) by mouth daily as needed.    Dispense:  30 capsule    Refill:  0   cyanocobalamin  (VITAMIN B12) 1000 MCG/ML injection    Sig: Inject 1 mL (1,000 mcg total) into the skin every 30 (thirty) days.    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 Pain exacerbated by recent trauma. Previous treatments ineffective. Diclofenac provides temporary relief. Hesitant about epidurals due to family history. Physical therapy ongoing without relief. Considering low-dose prednisone. - Start prednisone 5 mg daily for 10 days. If effective, increase to 10 mg daily. - Continue diclofenac sodium as needed. - Continue physical therapy. - Consider epidural injection if pain persists after 10 days of prednisone.  Ehlers-Danlos syndrome Chronic condition with joint hypermobility and pain. Concerns about exacerbation with certain treatments. 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 - Continue current management strategies. - Explore health coaching and potential licensure as a psychologist.  Myopia with progressive visual changes Progressive visual changes with worsening vision. Recent exam indicated significant changes, possibly medication or age-related. Glasses prescribed but vision changes rapidly. - Follow up with ophthalmologist to assess recent changes.  Thyroid  disorder, stable Thyroid  function well-managed.  Chronic fatigue - Continue current management strategies.  Geni Shutter,  DO, MS, FAAFP, Dipl. [facility] [address] [place], [address] Dept: [phone] Dept Fax: [phone]  Subjective:   Patient is well known to me from my previous clinic and is now establishing care with me as their primary care provider. Discussed the use of AI scribe software for clinical note transcription with the patient, who gave verbal consent to proceed. History of Present Illness Betty Lewis is a 38 year old female with Ehlers-Danlos Syndrome who presents with worsening back pain and vision changes.  Lumbar radiculopathy and back pain - Persistent, worsening back pain since late September following an incident involving her dog - Pain radiates down her leg to the top of her foot - Aggravated by lying flat or hyperextending her back - MRI demonstrates L4, L5, S1 degeneration and a fistula-like structure - Physical therapy has not provided relief - Trial of prednisone, meloxicam, and diclofenac sodium; only diclofenac provided temporary relief - Hesitant to pursue epidural intervention due to concerns about symptom exacerbation  Visual disturbances - Progressive worsening of vision since last eye exam in July - Impaired near and far vision despite prescription glasses for nearsightedness - Concerned that Zepbound and Xolair  may be contributing to vision changes  Medication use and adverse effects - Currently taking Zepbound at a high dose (35 to 40 units biweekly), Xolair , Vyvanse , and B12 supplements - Concerned about possible medication-related vision changes  Socioeconomic stressors - Experiencing financial difficulties impacting ability to afford medications - Assists her mother with house repairs  Past medical history, surgical history, allergies, family history, immunizations andmedications were updated in the EMR today and reviewed under the history and medication portions of  their EMR. Review of Systems:  Negative, with the exception of above mentioned in HPI.  Current Outpatient Medications:    B-D 3CC LUER-LOK SYR 25GX1 25G X 1 3 ML MISC, USE TO ADMINISTER VITAMIN B12 INJECTIONS, Disp: , Rfl:    cyclobenzaprine  (FLEXERIL ) 10 MG tablet, Take 1 tablet (10 mg total) by mouth 3 (three) times daily as needed for muscle spasms., Disp: 60 tablet, Rfl: 1   diclofenac (VOLTAREN) 75 MG EC tablet, Take 75 mg by mouth 2 (two) times daily with a meal., Disp: , Rfl:    EPINEPHrine  (EPIPEN  2-PAK) 0.3 mg/0.3 mL IJ SOAJ injection, Inject 0.3 mg into the muscle as needed for anaphylaxis., Disp: 0.3 mL, Rfl: 1   levonorgestrel -ethinyl estradiol  (SEASONALE) 0.15-0.03 MG tablet, Take 1 tablet by mouth daily. Patient is taking continuous contraception., Disp: 182 tablet, Rfl: 0   omalizumab  (XOLAIR ) 300 MG/2  ML prefilled syringe, Inject 300 mg into the skin every 14 (fourteen) days., Disp: 12 mL, Rfl: 3   pindolol  (VISKEN ) 5 MG tablet, Take 1 tablet by mouth twice daily, Disp: 180 tablet, Rfl: 3   triamcinolone  cream (KENALOG) 0.1 %, Apply topically 2 (two) times daily as needed., Disp: , Rfl:    cyanocobalamin  (VITAMIN B12) 1000 MCG/ML injection, Inject 1 mL (1,000 mcg total) into the skin every 30 (thirty) days., Disp: 6 mL, Rfl: 1   lisdexamfetamine (VYVANSE ) 20 MG capsule, Take 1 capsule (20 mg total) by mouth daily as needed., Disp: 30 capsule, Rfl: 0   traMADol  (ULTRAM ) 50 MG tablet, Take 1-2 tabs every 12 hours as needed for pain, Disp: 180 tablet, Rfl: 0   ZEPBOUND 15 MG/0.5ML Pen, Inject 15 mg into the skin once a week., Disp: 2 mL, Rfl: 2  Current Facility-Administered Medications:    omalizumab  (XOLAIR ) prefilled syringe 300 mg, 300 mg, Subcutaneous, Q28 days, Iva Marty Saltness, MD, 300 mg at 04/29/23 0930   Objective:   BP 102/68 (BP Location: Right Arm, Cuff Size: Normal)   Pulse 71   Ht 5' 6 (1.676 m)   Wt 136 lb 6.4 oz (61.9 kg)   LMP 05/24/2024   SpO2 97%   BMI 22.02 kg/m   Wt  Readings from Last 3 Encounters:  06/07/24 136 lb 6.4 oz (61.9 kg)  05/26/24 139 lb 3.2 oz (63.1 kg)  01/28/24 135 lb (61.2 kg)   Physical Exam Constitutional:      General: She is not in acute distress.    Appearance: She is well-developed.  HENT:     Head: Normocephalic and atraumatic.  Eyes:     Conjunctiva/sclera: Conjunctivae normal.  Cardiovascular:     Rate and Rhythm: Normal rate and regular rhythm.     Heart sounds: Normal heart sounds.  Pulmonary:     Effort: Pulmonary effort is normal.     Breath sounds: Normal breath sounds.  Musculoskeletal:     Cervical back: Normal range of motion and neck supple.  Neurological:     General: No focal deficit present.     Mental Status: She is alert and oriented to person, place, and time.  Psychiatric:        Mood and Affect: Mood normal.        Behavior: Behavior normal.    Lab Results  Component Value Date   CREATININE 0.68 06/07/2024   BUN 29 (H) 06/07/2024   NA 137 06/07/2024   K 4.1 06/07/2024   CL 106 06/07/2024   CO2 27 06/07/2024   Lab  Results  Component Value Date   ALT 20 06/07/2024   AST 17 06/07/2024   ALKPHOS 33 (L) 06/07/2024   BILITOT 0.9 06/07/2024   No results found for: HGBA1C No results found for: INSULIN Lab Results  Component Value Date   TSH 1.34 06/07/2024   Lab Results  Component Value Date   CHOL 237 (H) 06/07/2024   HDL 53.50 06/07/2024   LDLCALC 150 (H) 06/07/2024   TRIG 167.0 (H) 06/07/2024   CHOLHDL 4 06/07/2024   Lab Results  Component Value Date   VD25OH 45.77 06/07/2024   Lab Results  Component Value Date   WBC 7.7 06/07/2024   HGB 14.1 06/07/2024   HCT 41.1 06/07/2024   MCV 90.1 06/07/2024   PLT 288.0 06/07/2024

## 2024-06-13 ENCOUNTER — Ambulatory Visit: Payer: Self-pay | Admitting: Family Medicine

## 2024-06-16 ENCOUNTER — Encounter: Payer: Self-pay | Admitting: Family Medicine

## 2024-06-16 DIAGNOSIS — E559 Vitamin D deficiency, unspecified: Secondary | ICD-10-CM | POA: Insufficient documentation

## 2024-06-16 DIAGNOSIS — Z79899 Other long term (current) drug therapy: Secondary | ICD-10-CM | POA: Insufficient documentation

## 2024-06-16 DIAGNOSIS — Z8639 Personal history of other endocrine, nutritional and metabolic disease: Secondary | ICD-10-CM

## 2024-06-16 DIAGNOSIS — R5382 Chronic fatigue, unspecified: Secondary | ICD-10-CM | POA: Insufficient documentation

## 2024-06-16 MED ORDER — ZEPBOUND 15 MG/0.5ML ~~LOC~~ SOAJ: 15.0000 mg | SUBCUTANEOUS | 2 refills | Status: AC

## 2024-06-16 NOTE — Telephone Encounter (Signed)
 Please see response from pt below. Thank you

## 2024-06-27 ENCOUNTER — Other Ambulatory Visit (HOSPITAL_COMMUNITY): Payer: Self-pay

## 2024-06-29 ENCOUNTER — Encounter: Payer: Self-pay | Admitting: Family Medicine

## 2024-06-29 ENCOUNTER — Ambulatory Visit (INDEPENDENT_AMBULATORY_CARE_PROVIDER_SITE_OTHER): Admitting: Family Medicine

## 2024-06-29 VITALS — BP 108/62 | HR 84 | Ht 65.0 in | Wt 133.8 lb

## 2024-06-29 DIAGNOSIS — Z7409 Other reduced mobility: Secondary | ICD-10-CM

## 2024-06-29 DIAGNOSIS — R5382 Chronic fatigue, unspecified: Secondary | ICD-10-CM

## 2024-06-29 DIAGNOSIS — Z6822 Body mass index (BMI) 22.0-22.9, adult: Secondary | ICD-10-CM

## 2024-06-29 DIAGNOSIS — E538 Deficiency of other specified B group vitamins: Secondary | ICD-10-CM

## 2024-06-29 DIAGNOSIS — E782 Mixed hyperlipidemia: Secondary | ICD-10-CM

## 2024-06-29 DIAGNOSIS — G901 Familial dysautonomia [Riley-Day]: Secondary | ICD-10-CM

## 2024-06-29 DIAGNOSIS — Q7962 Hypermobile Ehlers-Danlos syndrome: Secondary | ICD-10-CM

## 2024-06-29 DIAGNOSIS — G894 Chronic pain syndrome: Secondary | ICD-10-CM

## 2024-06-29 DIAGNOSIS — F50814 Binge eating disorder, in remission: Secondary | ICD-10-CM

## 2024-06-29 DIAGNOSIS — Z8639 Personal history of other endocrine, nutritional and metabolic disease: Secondary | ICD-10-CM

## 2024-06-29 MED ORDER — LISDEXAMFETAMINE DIMESYLATE 20 MG PO CAPS: 20.0000 mg | ORAL_CAPSULE | Freq: Every day | ORAL | 0 refills | Status: DC | PRN

## 2024-06-29 MED ORDER — DICLOFENAC SODIUM 75 MG PO TBEC: 75.0000 mg | DELAYED_RELEASE_TABLET | Freq: Two times a day (BID) | ORAL | 0 refills | Status: AC

## 2024-06-29 NOTE — Progress Notes (Signed)
 "    Diagnoses and Orders:   1. B12 deficiency   2. Ehlers-Danlos syndrome type III   3. Impaired functional mobility, balance, gait, and endurance   4. Mixed hyperlipidemia   5. Dysautonomia (HCC)   6. Chronic fatigue   7. Chronic pain syndrome   8. Binge eating disorder in remission   9. Hx of obesity   10. Normal weight with body mass index (BMI) of 22.0 to 22.9 in adult    Meds ordered this encounter  Medications   diclofenac  (VOLTAREN ) 75 MG EC tablet    Sig: Take 1 tablet (75 mg total) by mouth 2 (two) times daily with a meal.    Dispense:  180 tablet    Refill:  0   lisdexamfetamine  (VYVANSE ) 20 MG capsule    Sig: Take 1 capsule (20 mg total) by mouth daily as needed.    Dispense:  30 capsule    Refill:  0   cyanocobalamin  (VITAMIN B12) 1000 MCG/ML injection    Sig: Inject 1 mL (1,000 mcg total) into the skin every 14 (fourteen) days.    Dispense:  6 mL    Refill:  1   B-D 3CC LUER-LOK SYR 25GX1 25G X 1 3 ML MISC    Sig: To administer B12 injection q 2 weeks.    Dispense:  50 each    Refill:  0   Orders Placed This Encounter  Procedures   Ambulatory referral to Sports Medicine   Assessment & Plan:   Assessment and Plan Assessment & Plan Lumbosacral degenerative disc disease with radiculopathy and chronic pain syndrome Chronic back pain persists despite current medication. Physical therapy ineffective. Considering LDN for pain and fatigue, but tramadol  tapering needed first. Patient hesitant about medication changes. - Increase tramadol  to twice daily and monitor effectiveness. - Consider tapering tramadol  for potential LDN initiation. - Continue diclofenac  75 mg oral bid. - Encouraged walking for pain management. - Discussed prior use of body braid for SI joint support.  Ehlers-Danlos syndrome type III Chronic pain and fatigue associated with EDS. Considering LDN for additional management. Participation in opioid study may influence treatment. - Referred  to Dr. Bessie for specialized management of EDS. - Consider LDN after tramadol  tapering.  Dysautonomia Symptoms managed with pindolol . Previous beta blockers not tolerated. Salt intake and compression stockings ineffective. - Continue pindolol .  Hyperlipidemia Cholesterol levels elevated. Previous lipid panel showed elevated triglycerides and abnormalities. Further testing needed. - Ordered lipoprotein A and NMR test to assess particle size and composition.  Vitamin B12 deficiency Managed with monthly B12 injections. Recent injection caused discomfort. B12 levels remain low. - Increased B12 injections to every two weeks. - Prescribed needles for B12 injections.  Geni Shutter, DO, MS, FAAFP, Dipl. [facility] [address] [place], [address] Dept: [phone] Dept Fax: [phone]  Subjective:   History of Present Illness Betty Lewis is a 38 year old female with chronic back pain and hyperlipidemia who presents with concerns about her cholesterol levels and pain management.  Lumbosacral back pain - Persistent pain for 4 years, baseline intensity 3-4/10 - Pain is worse in the morning and at the end of the workday - Tramadol  100 mg taken in the morning provides some relief without causing drowsiness, but benefit is questionable - Diclofenac  taken every 12 hours improves pain, onset of action approximately 1 hour - Previous trials of Vicodin, oxycodone, and physical therapy did not provide significant relief  Hyperlipidemia -  Concerned about elevated cholesterol and triglyceride levels - Uncertain about recent lipid panel testing  Postural orthostatic tachycardia syndrome (pots) - Pindolol  taken in the early morning to prevent fainting upon standing  Vitamin b12 deficiency - Receives B12 injections for low levels - Considering increasing injection frequency to every 2 weeks - Difficulty self-administering  injections  Review of Systems: Negative, with the exception of above mentioned in HPI.  Medications:   Show/hide medication list[1]  Objective:   BP 108/62 (BP Location: Right Arm, Cuff Size: Normal)   Pulse 84   Ht 5' 5 (1.651 m)   Wt 133 lb 12.8 oz (60.7 kg)   LMP 05/24/2024   SpO2 98%   BMI 22.27 kg/m   Physical Exam Constitutional:      General: She is not in acute distress.    Appearance: She is well-developed.  HENT:     Head: Normocephalic and atraumatic.  Eyes:     Conjunctiva/sclera: Conjunctivae normal.  Cardiovascular:     Rate and Rhythm: Normal rate and regular rhythm.     Heart sounds: Normal heart sounds.  Pulmonary:     Effort: Pulmonary effort is normal.     Breath sounds: Normal breath sounds.  Neurological:     General: No focal deficit present.     Mental Status: She is alert.  Psychiatric:        Behavior: Behavior normal.    Attestations:   Reviewed by clinician on day of visit: allergies, medications, problem list, medical history, surgical history, family history, social history, and previous encounter notes. Discussed the use of AI scribe software for clinical note transcription with the patient, who gave verbal consent to proceed.     [1]  Outpatient Medications Prior to Visit  Medication Sig   cyclobenzaprine  (FLEXERIL ) 10 MG tablet Take 1 tablet (10 mg total) by mouth 3 (three) times daily as needed for muscle spasms.   EPINEPHrine  (EPIPEN  2-PAK) 0.3 mg/0.3 mL IJ SOAJ injection Inject 0.3 mg into the muscle as needed for anaphylaxis.   levonorgestrel -ethinyl estradiol  (SEASONALE) 0.15-0.03 MG tablet Take 1 tablet by mouth daily. Patient is taking continuous contraception.   omalizumab  (XOLAIR ) 300 MG/2  ML prefilled syringe Inject 300 mg into the skin every 14 (fourteen) days.   pindolol  (VISKEN ) 5 MG tablet Take 1 tablet by mouth twice daily   traMADol  (ULTRAM ) 50 MG tablet Take 1-2 tabs every 12 hours as needed for pain    triamcinolone  cream (KENALOG) 0.1 % Apply topically 2 (two) times daily as needed.   ZEPBOUND  15 MG/0.5ML Pen Inject 15 mg into the skin once a week.   [DISCONTINUED] B-D 3CC LUER-LOK SYR 25GX1 25G X 1 3 ML MISC USE TO ADMINISTER VITAMIN B12 INJECTIONS   [DISCONTINUED] cyanocobalamin  (VITAMIN B12) 1000 MCG/ML injection Inject 1 mL (1,000 mcg total) into the skin every 30 (thirty) days.   [DISCONTINUED] diclofenac  (VOLTAREN ) 75 MG EC tablet Take 75 mg by mouth 2 (two) times daily with a meal.   [DISCONTINUED] lisdexamfetamine  (VYVANSE ) 20 MG capsule Take 1 capsule (20 mg total) by mouth daily as needed.   Facility-Administered Medications Prior to Visit  Medication Dose Route Frequency Provider   omalizumab  (XOLAIR ) prefilled syringe 300 mg  300 mg Subcutaneous Q28 days Iva Marty Saltness, MD   "

## 2024-07-13 ENCOUNTER — Encounter: Payer: Self-pay | Admitting: Family Medicine

## 2024-07-13 DIAGNOSIS — Z8639 Personal history of other endocrine, nutritional and metabolic disease: Secondary | ICD-10-CM | POA: Insufficient documentation

## 2024-07-13 DIAGNOSIS — E538 Deficiency of other specified B group vitamins: Secondary | ICD-10-CM | POA: Insufficient documentation

## 2024-07-13 DIAGNOSIS — F50814 Binge eating disorder, in remission: Secondary | ICD-10-CM | POA: Insufficient documentation

## 2024-07-13 DIAGNOSIS — G894 Chronic pain syndrome: Secondary | ICD-10-CM | POA: Insufficient documentation

## 2024-07-13 MED ORDER — CYANOCOBALAMIN 1000 MCG/ML IJ SOLN: 1000.0000 ug | INTRAMUSCULAR | 1 refills | Status: AC

## 2024-07-13 MED ORDER — "BD LUER-LOK SYRINGE 25G X 1"" 3 ML MISC": 0 refills | Status: AC

## 2024-07-26 ENCOUNTER — Ambulatory Visit: Admitting: Family Medicine

## 2024-07-26 NOTE — Progress Notes (Unsigned)
"       ° °  LILLETTE Ileana Collet, PhD, LAT, ATC acting as a scribe for Artist Lloyd, MD.  Betty Lewis is a 39 y.o. female who presents to [facility] at [facility] today for management of her EDS and dysautonomia.  MS: Chronic joint pain, Chronic wide spread muscle pain, and Joint Hypermobility Skin/Immune reactions: Rashes / Hives Neurological: {EDSNEURO:33797} ANS: Dysautonomia / POTS / Orthostatic Intolerance and Fatigue Respiratory: {EDSRESP:33799} CV: {EDSCARDIO:33816} GI:  {EDSGI:33817} Genitourinary: {EDSUROGEN:33818} Hands & Feet: {EDSHANDSFEET:33819}  Treatments tried: Tramadol , oral diclofenac ,   Pertinent review of systems: ***  Relevant historical information: ***   Exam:  There were no vitals taken for this visit. General: Well Developed, well nourished, and in no acute distress.   MSK: ***    Lab and Radiology Results No results found for this or any previous visit (from the past 72 hours). No results found.     Assessment and Plan: 39 y.o. female with ***   PDMP not reviewed this encounter. No orders of the defined types were placed in this encounter.  No orders of the defined types were placed in this encounter.    Discussed warning signs or symptoms. Please see discharge instructions. Patient expresses understanding.   ***  "

## 2024-07-27 NOTE — Telephone Encounter (Signed)
 Please advise. Thank you.

## 2024-07-27 NOTE — Telephone Encounter (Signed)
 I have spoken with pt. Pt has been scheduled to be seen tomorrow with Dr Prentiss at 240pm.

## 2024-07-28 ENCOUNTER — Ambulatory Visit: Admitting: Family Medicine

## 2024-07-28 ENCOUNTER — Encounter: Payer: Self-pay | Admitting: Family Medicine

## 2024-07-28 VITALS — BP 104/58 | HR 81 | Ht 66.0 in | Wt 139.0 lb

## 2024-07-28 DIAGNOSIS — D8940 Mast cell activation, unspecified: Secondary | ICD-10-CM | POA: Insufficient documentation

## 2024-07-28 DIAGNOSIS — J3489 Other specified disorders of nose and nasal sinuses: Secondary | ICD-10-CM

## 2024-08-01 DIAGNOSIS — E785 Hyperlipidemia, unspecified: Secondary | ICD-10-CM | POA: Insufficient documentation

## 2024-08-01 NOTE — Progress Notes (Unsigned)
"       ° °  LILLETTE Ileana Collet, PhD, LAT, ATC acting as a scribe for Artist Lloyd, MD.  Betty Lewis is a 39 y.o. female who presents to [facility] at [facility] today for management of her EDS and dysautonomia.  MS: Chronic joint pain, Chronic wide spread muscle pain, and Joint Hypermobility Skin/Immune reactions: MCAS and Rashes / Hives Neurological: {EDSNEURO:33797} ANS: Dysautonomia / POTS / Orthostatic Intolerance and Fatigue Respiratory: {EDSRESP:33799} CV: {EDSCARDIO:33816} GI:  {EDSGI:33817} Genitourinary: {EDSUROGEN:33818} Hands & Feet: {EDSHANDSFEET:33819}  Treatments tried: Tramadol , oral diclofenac ,   Pertinent review of systems: ***  Relevant historical information: ***   Exam:  There were no vitals taken for this visit. General: Well Developed, well nourished, and in no acute distress.   MSK: ***    Lab and Radiology Results No results found for this or any previous visit (from the past 72 hours). No results found.     Assessment and Plan: 39 y.o. female with ***   PDMP not reviewed this encounter. No orders of the defined types were placed in this encounter.  No orders of the defined types were placed in this encounter.    Discussed warning signs or symptoms. Please see discharge instructions. Patient expresses understanding.   ***  "

## 2024-08-02 ENCOUNTER — Encounter: Payer: Self-pay | Admitting: Family Medicine

## 2024-08-02 ENCOUNTER — Ambulatory Visit: Admitting: Family Medicine

## 2024-08-02 VITALS — BP 102/60 | HR 91 | Ht 66.0 in | Wt 135.0 lb

## 2024-08-02 DIAGNOSIS — M5416 Radiculopathy, lumbar region: Secondary | ICD-10-CM

## 2024-08-02 DIAGNOSIS — Q7962 Hypermobile Ehlers-Danlos syndrome: Secondary | ICD-10-CM | POA: Diagnosis not present

## 2024-08-02 MED ORDER — NORTRIPTYLINE HCL 25 MG PO CAPS: 25.0000 mg | ORAL_CAPSULE | Freq: Every day | ORAL | 2 refills | Status: AC

## 2024-08-02 NOTE — Patient Instructions (Signed)
 Thank you for coming in today.   A referral for physical therapy has been submitted. A representative from the physical therapy office will contact you to coordinate scheduling after confirming your benefits with your insurance provider. If you do not hear from the physical therapy office within the next 1-2 weeks, please let us  know.   Records release to [facility] for lumbar MRI.   Start Nortriptyline  at bedtime.   Check out the book Disjointed Navigating the Diagnosis and Management of Hypermobile Ehlers-Danlos Syndrome and Hypermobility Spectrum Disorders.    For POTS symptoms: Consume 3 L water and 8-12 gram of salt per day   Check out these websites for exercises to try if you have POTS ([employer] Protocol, [place] Protocol, and [employer] Protocol) Https://www.taylor-robbins.[email or web address]/ https://dean.info/   Guide to Prof. Consuelo Patient Self-Care Handouts https://webspace.Http://www.black-smith.org/  Orthostatic Intolerance and Postural Orthostatic Tachycardia Self-Care Checklist https://webspace.Wvlyxc.com  Steps To Managing Your Mast Cell Activation Syndrome https://[email or web address].Newyearsms.fi.pdf   See you back in 2 months

## 2024-08-04 ENCOUNTER — Other Ambulatory Visit: Payer: Self-pay | Admitting: Family Medicine

## 2024-08-04 NOTE — Telephone Encounter (Signed)
 Checking with lab staff to ensure the process and handling instructions are clear for processing.

## 2024-08-06 LAB — BETA 2 TRANSFERRIN: Beta-2 Transferrin, Fluid: NOT DETECTED

## 2024-08-09 ENCOUNTER — Ambulatory Visit: Payer: Self-pay | Admitting: Family Medicine

## 2024-08-10 ENCOUNTER — Ambulatory Visit: Admitting: Physical Therapy

## 2024-08-10 ENCOUNTER — Ambulatory Visit: Admitting: Family Medicine

## 2024-08-10 DIAGNOSIS — M357 Hypermobility syndrome: Secondary | ICD-10-CM | POA: Diagnosis not present

## 2024-08-10 DIAGNOSIS — M5459 Other low back pain: Secondary | ICD-10-CM | POA: Diagnosis not present

## 2024-08-10 NOTE — Therapy (Signed)
 " OUTPATIENT PHYSICAL THERAPY HYPERMOBILITY EVAL  Patient Name: Betty Lewis MRN: [id number] DOB:05/05/1986, 39 y.o., female Today's Date: 08/10/2024  END OF SESSION:  PT End of Session - 08/10/24 0947     Visit Number 1    Number of Visits --    Date for Recertification  10/05/24    Authorization Type [employer]    PT Start Time 0935    PT Stop Time 1015    PT Time Calculation (min) 40 min    Activity Tolerance Patient tolerated treatment well    Behavior During Therapy [facility] for tasks assessed/performed          Past Medical History:  Diagnosis Date   Allergy  2002   seasonal allergies   Angio-edema    Bulging lumbar disc    X 2   EDS (Ehlers-Danlos syndrome)    Mast cell activation syndrome    PONV (postoperative nausea and vomiting)    Urticaria    Past Surgical History:  Procedure Laterality Date   HIP ARTHROSCOPY Left 07/28/2019   HYSTEROSCOPY N/A 11/06/2017   Procedure: HYSTEROSCOPY;  Surgeon: Marilynn Nest, DO;  Location: [facility];  Service: Gynecology;  Laterality: N/A;   IUD REMOVAL N/A 11/06/2017   Procedure: INTRAUTERINE DEVICE (IUD) REMOVAL;  Surgeon: Ozan, Shaketha Jeon, DO;  Location: [facility];  Service: Gynecology;  Laterality: N/A;   LEEP  2013   TONSILLECTOMY  2005   WISDOM TOOTH EXTRACTION  2004   Patient Active Problem List   Diagnosis Date Noted   Hyperlipidemia 08/01/2024   Mast cell activation syndrome    Chronic pain syndrome 07/13/2024   Binge eating disorder in remission 07/13/2024   Hx of obesity 07/13/2024   B12 deficiency 07/13/2024   Chronic fatigue 06/16/2024   High risk medication use 06/16/2024   Vitamin D  deficiency 06/16/2024   Impaired functional mobility, balance, gait, and endurance 06/11/2024   Acute pain of left shoulder 12/17/2023   Carpal tunnel syndrome 12/17/2023   Obesity 10/22/2021   Thyroid  function study abnormality 04/23/2021   Weight gain 04/10/2021   Hives 11/13/2020   Orthostatic intolerance 05/28/2020   Dysautonomia  (HCC) 05/28/2020   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 01/17/2020   POTS (postural orthostatic tachycardia syndrome) 01/17/2020   Ehlers-Danlos syndrome type III 09/06/2019   Pain of left hip joint 11/16/2018   Trochanteric bursitis of left hip 11/04/2018   Pain in joint of right shoulder 08/11/2018   Lumbar radiculopathy 03/11/2018   Cervical radiculitis 02/04/2018   Pain of cervical spine 11/06/2017    PCP: Prentiss Frieze DO   REFERRING PROVIDER: Joane Artist RAMAN, MD  REFERRING DIAG: [phone] (ICD-10-CM) - Hypermobile Ehlers-Danlos syndrome  Rationale for Evaluation and Treatment: Rehabilitation  THERAPY DIAG:  Hypermobility syndrome  Other low back pain  ONSET DATE: chronic   SUBJECTIVE:  SUBJECTIVE STATEMENT: Patient had PT back in 2022 with me in managing of her hypermobile syndrome and left hip.  She reports some changes since then and has noted a decline in her overall function and strength.  She reports her lower back is the main issue right now.  I patient reports she has fallen a couple of times.  In May 2024 she did some rollerskating and fell on her tailbone the pain lasted for about a month she reports the pain did get better and then in September her dog jumped on her left hip while she was sleeping and that increased her back pain.  She says her pain has not stopped hurting since then.  She does report a general weakness but nothing asymmetrical or foot drop.  Her pain occasionally will go down into her left lower extremity to her foot.  XR Sept MRI Nov.  See below  Dr. Baird recommended an epidural injection performed by Dr. Gifford and patient has decided not to do that at this time.    Patient reports symptoms and/or instability in the following areas:  - Cervical  Spine: [x] Pain [x] Instability [] Limited ROM [] Paresthesia  - Thoracic Spine: [] Pain [] Instability  - Lumbar Spine: [x] Pain [x] Instability [] Frequent locking/giving out  - Shoulder: [] L [x] R  [x] Subluxation [x] Dislocations [x] Pain [] Fatigue  - Elbow: [] L [] R  [] Instability [] Hyperextension [] Pain  - Wrist/Hand: [] L [] R  [] Instability [x] Fatigue with use [] Pain  - Hip: [x] L [x] R  [x] Instability [x] Pain [] Clicking [] Gait deviations  - Knee: [] L [] R  [] Instability [] Hyperextension [] Pain  - Ankle/Foot: [] L [] R  [x] Instability [x] Frequent sprains Pain   Does the patient have a diagnosis of any of the following: Fatigue[x]  Brain fog[x]  Lightheadedness[x]  Allergies[x]  MCAS  GI[]  Neurodiverse[]   Additional Notes:  _______Scoliosis _______________________________   Patient-reported Beighton Score (if known): _______/9  Clinician-assessed Beighton Score (today): _______/9   PERTINENT HISTORY:  Ehlers-Danlos syndrome type III lumbar disc bulge mast cell activation syndrome Left labral tear, POTS, chronic pain    PAIN:  Are you having pain? Yes: NPRS scale: 4/10.  Pain location: low back  Pain description: aching, dull  Aggravating factors: sitting too long > 30 min, standing too long , not been walking Relieving factors: not a lot, repositioning, Diclofenac , pain meds , nortriptyline   Worse in AM and PM. Overnight is the worst    PRECAUTIONS: None  RED FLAGS: None Pt had a sample of clear fluid- CSK leak? Seeing DO tomorrow  WEIGHT BEARING RESTRICTIONS: No  FALLS:  Has patient fallen in last 6 months? Yes. Number of falls 1-2 , I'm not clumsy but I am painting a garage, fell.  Balance is OK .  Fell in the yard.   LIVING ENVIRONMENT: Lives with: lives with their family Lives in: House/apartment Stairs: Yes: Internal: no issues  steps; na Has following equipment at home: None  OCCUPATION: working from home   PLOF: Independent, Vocation/Vocational requirements:  works as a research scientist (medical), engineer, water psychotherapy, Leisure: being active, dog,, and lives with mom who has had a stroke  PATIENT GOALS: I would like to be able to walk more, improve confidence, Weaker now was 2023 was working out a lot.   NEXT MD VISIT: 1/29  OBJECTIVE:  Note: Objective measures were completed at Evaluation unless otherwise noted.  DIAGNOSTIC FINDINGS:  See media  MRI lumbar spine demonstrates a small disc protrusion L4-5 with a faint annular fissure.  Mild foraminal narrowing L5-S1 on the left.  There is some lateral recess narrowing L4-L5 on the left  x-rays demonstrate a mild thoracolumbar scoliosis mild pelvic obliquity     PATIENT SURVEYS:  PSFS: THE PATIENT SPECIFIC FUNCTIONAL SCALE  Place score of 0-10 (0 = unable to perform activity and 10 = able to perform activity at the same level as before injury or problem)  Activity Date: 08/10/24    Walk 2 miles 0    2.  Lift heavy item 2    3.  Sleep longer/less pain 1    4.  Sit with less pain 1    Total Score 4      Total Score = Sum of activity scores/number of activities  Minimally Detectable Change: 3 points (for single activity); 2 points (for average score)  [facility] (nd). The Patient Specific Functional Scale . Retrieved from [email or web address]   COGNITION: Overall cognitive status: Within functional limits for tasks assessed     SENSATION: WFL No lower extremity numbness or tingling today.  POSTURE: No Significant postural limitations decreased thoracic kyphosis and decreased lumbar lordosis  PALPATION: Pain along the left gluteus medius with deep palpation  LUMBAR ROM: Within functional limits, rotation not tested today  AROM eval  Flexion   Extension   Right lateral flexion   Left lateral flexion   Right rotation   Left rotation    (Blank rows = not tested)  LOWER EXTREMITY ROM:   Within functional limits hypermobile in  flexion and external rotation, relative hypermobility and internal rotation of the hips  Passive  Right eval Left eval  Hip flexion    Hip extension    Hip abduction    Hip adduction    Hip internal rotation    Hip external rotation    Knee flexion    Knee extension    Ankle dorsiflexion    Ankle plantarflexion    Ankle inversion    Ankle eversion     (Blank rows = not tested)  LOWER EXTREMITY MMT:    MMT Right eval Left eval  Hip flexion 5 5  Hip extension 4 4-  Hip abduction 4+ 4+  Hip adduction 4 4-  Hip internal rotation 4+ 4+   Hip external rotation 4+ Pain 4  Knee flexion    Knee extension    Ankle dorsiflexion    Ankle plantarflexion    Ankle inversion    Ankle eversion     (Blank rows = not tested)  LUMBAR SPECIAL TESTS:  Prone instability test: Positive, Straight leg raise test: Negative, and Trendelenburg sign: Mild Trendelenburg with single-leg stance able to correct when cued Able to do step ups without increased pain, normal gait pattern Instability noted with prone hip extension Compression to sacrum sacral thrust felt good.  GAIT: No deviations noted    TREATMENT DATE:   08/10/24  PT evaluation completed. Did not issue home exercise program but will prioritize that on the first session  PATIENT EDUCATION:  Education details: Plan of care posture, alignment, neutral zone and joint protection.  MRI results not always fully reflective of pain or correlation to current symptoms PNE/Explain pain   Joint inflammation/collagen/ligament laxity, muscular support Stretching vs stabilizing  Person educated: Patient Education method: Explanation, Demonstration, Verbal cues Education comprehension: verbalized understanding     HOME EXERCISE PROGRAM: None on evaluation  ASSESSMENT:  CLINICAL IMPRESSION: Patient is a  38y.o. female who was seen today for physical therapy evaluation and treatment for myalgia, lower back pain acute on chronic in the setting of hypermobile Ehlers-Danlos syndrome.   OBJECTIVE IMPAIRMENTS: decreased mobility, difficulty walking, decreased strength, postural dysfunction, pain, and hypermobility, instability .   ACTIVITY LIMITATIONS: carrying, lifting, bending, sitting, standing, sleeping, and locomotion level  PARTICIPATION LIMITATIONS: interpersonal relationship, shopping, community activity, and occupation  PERSONAL FACTORS: Past/current experiences and 3+ comorbidities: Genetic condition connective tissue disorder, POTS, left labral tear are also affecting patient's functional outcome.   REHAB POTENTIAL: Excellent  CLINICAL DECISION MAKING: Evolving/moderate complexity  EVALUATION COMPLEXITY: Moderate   GOALS: Goals reviewed with patient? Yes  SHORT TERM GOALS: Target date: 09/07/2024   Patient will be able to show independence for initial HEP to include posture, core and hip strength and stability.   Baseline: Goal status: INITIAL   2.  Patient will be able to demonstrate proper posture and lifting techniques related to spine health and reduction of symptoms.   Baseline:  Goal status: INITIAL   3.  Patient will complete functional screening for core balance and set goal Baseline:  Goal status: INITIAL     LONG TERM GOALS: Target date: 10/05/2024  Patient will be independent with final HEP upon discharge from PT and report consistent benefit following exercise completion.    Baseline:  Goal status: INITIAL   2.  Patient will be able to walk for 1 mile without increasing lower back pain > 5/10  Baseline:  Goal status: INITIAL   3.  Pt will be able to improve patient specific functional scale by 3 points Baseline: 4 Goal status: INITIAL   4.  Patient will be I with concepts of joint protection and stability as it pertains to joint  hypermobility. Baseline:  Goal status: INITIAL   5.  Patient will be able to go on a 1 to 2-hour errands or recreational activity without limitation of fatigue Baseline:  Goal status: INITIAL   6.  Patient will be able to lift light items from the floor with good form and no increased pain in order to prevent further flareups. Baseline:  Goal status: INITIAL      PLAN:  PT FREQUENCY: 1-2x/week  PT DURATION: 8 weeks  PLANNED INTERVENTIONS: 97164- PT Re-evaluation, 97750- Physical Performance Testing, 97110-Therapeutic exercises, 97530- Therapeutic activity, [id number]- Neuromuscular re-education, 97535- Self Care, 02859- Manual therapy, [id number]- Electrical stimulation (unattended), 20560 (1-2 muscles), 20561 (3+ muscles)- Dry Needling, Patient/Family education, Balance training, Taping, Cryotherapy, and Moist heat.  PLAN FOR NEXT SESSION: core HEP. Pilates, Dry needling?    Jibri Schriefer, PT 08/10/2024, 1:15 PM   Delon Norma, PT 08/10/24 1:31 PM Phone: [phone] Fax: [phone]  "

## 2024-08-11 ENCOUNTER — Encounter: Payer: Self-pay | Admitting: Family Medicine

## 2024-08-11 ENCOUNTER — Ambulatory Visit: Admitting: Family Medicine

## 2024-08-11 VITALS — BP 120/64 | HR 70 | Ht 66.0 in | Wt 137.0 lb

## 2024-08-11 DIAGNOSIS — Z6822 Body mass index (BMI) 22.0-22.9, adult: Secondary | ICD-10-CM

## 2024-08-11 DIAGNOSIS — E782 Mixed hyperlipidemia: Secondary | ICD-10-CM

## 2024-08-11 DIAGNOSIS — G894 Chronic pain syndrome: Secondary | ICD-10-CM

## 2024-08-11 DIAGNOSIS — Z8639 Personal history of other endocrine, nutritional and metabolic disease: Secondary | ICD-10-CM

## 2024-08-11 DIAGNOSIS — F50814 Binge eating disorder, in remission: Secondary | ICD-10-CM

## 2024-08-11 MED ORDER — TRAMADOL HCL 50 MG PO TABS: ORAL_TABLET | ORAL | 0 refills | Status: AC

## 2024-08-11 MED ORDER — LISDEXAMFETAMINE DIMESYLATE 20 MG PO CAPS: 20.0000 mg | ORAL_CAPSULE | Freq: Every day | ORAL | 0 refills | Status: AC | PRN

## 2024-08-11 NOTE — Progress Notes (Signed)
 "  The patient is being seen today for an acute visit.  Patient Care Team: Prentiss Frieze, DO as PCP - General (Family Medicine) Shlomo Wilbert SAUNDERS, MD as PCP - Cardiology (Cardiology) Cathlyn JAYSON Nikki Bobie FORBES, MD as Consulting Physician (Obstetrics and Gynecology)   Subjective:   Chief Complaint  Patient presents with   Acute Visit    Pt is here today to be seen for a runny nose. She has been renovating her moms house for the past few weeks and has been bent over sanding. Her nose runs and she has noticed a taste to it. She has also been fatigued, had vision changes, and ringing in her ears. She is concerned for a possible CSF leak due to her EDS.   Review of Systems: Negative, with the exception of above mentioned in HPI.  History:   Reviewed by clinician on day of visit: allergies, medications, problem list, medical history, surgical history, family history, social history, and previous encounter notes.  Medications:   Active Medications[1] Allergies[2]  Objective:   BP (!) 104/58 (BP Location: Left Arm, Cuff Size: Normal)   Pulse 81   Ht 5' 6 (1.676 m)   Wt 139 lb (63 kg)   SpO2 98%   BMI 22.44 kg/m   Physical Exam Constitutional:      General: She is not in acute distress.    Appearance: She is well-developed.  HENT:     Head: Normocephalic and atraumatic.     Right Ear: Tympanic membrane normal.     Left Ear: Tympanic membrane normal.     Nose: Nose normal.     Comments: Clear nasal discharge.    Mouth/Throat:     Mouth: Mucous membranes are moist.  Eyes:     Conjunctiva/sclera: Conjunctivae normal.  Cardiovascular:     Rate and Rhythm: Normal rate and regular rhythm.     Heart sounds: Normal heart sounds.  Pulmonary:     Effort: Pulmonary effort is normal.     Breath sounds: Normal breath sounds.  Neurological:     General: No focal deficit present.     Mental Status: She is alert.  Psychiatric:        Behavior: Behavior normal.    Assessment &  Plan:   Rhinorrhea, persistent, unilateral Patient with known Ehlers-Danlos syndrome presents with new symptoms following prolonged repetitive neck flexion/extension and overhead activity (painting garage). Denies head trauma, neurosurgical procedures, lumbar puncture, or prior history of CSF leak.  Reviewed that EDS is associated with connective tissue fragility and may increase susceptibility to spontaneous CSF leak even in the absence of overt trauma. However, current presentation may also be consistent with musculoskeletal strain, cervicogenic headache, orthostatic intolerance, migraine variant, or sinus-related symptoms.  At this time, patient has no red flag symptoms including fever, neck stiffness, altered mental status, or focal neurologic deficits.  Plan: - Activity modification: avoid heavy lifting, straining, Valsalva, forceful nose blowing, and prolonged neck flexion/extension. - Supportive care and close symptom monitoring. - Will obtain beta-2 transferrin testing on collected fluid. - Low threshold for ENT and/or Neurology referral if symptoms persist, worsen, or demonstrate clear positional pattern. - Imaging deferred at this time given stable exam and absence of neurologic deficits. - Strict return precautions reviewed: fever, worsening headache, neck stiffness, confusion, neurologic changes, or persistent clear unilateral drainage ? ED evaluation.  Frieze Prentiss, DO, MS, FAAFP, Dipl. [facility] [address] [place], [address] Dept: [phone] Dept  Fax: [phone]    [1]  Current Meds  Medication Sig   B-D 3CC LUER-LOK SYR 25GX1 25G X 1 3 ML MISC To administer B12 injection q 2 weeks.   cyanocobalamin  (VITAMIN B12) 1000 MCG/ML injection Inject 1 mL (1,000 mcg total) into the skin every 14 (fourteen) days.   diclofenac  (VOLTAREN ) 75 MG EC tablet Take 1 tablet (75 mg total) by mouth 2 (two) times daily with a meal.    EPINEPHrine  (EPIPEN  2-PAK) 0.3 mg/0.3 mL IJ SOAJ injection Inject 0.3 mg into the muscle as needed for anaphylaxis.   levonorgestrel -ethinyl estradiol  (SEASONALE) 0.15-0.03 MG tablet Take 1 tablet by mouth daily. Patient is taking continuous contraception.   lisdexamfetamine  (VYVANSE ) 20 MG capsule Take 1 capsule (20 mg total) by mouth daily as needed.   omalizumab  (XOLAIR ) 300 MG/2  ML prefilled syringe Inject 300 mg into the skin every 14 (fourteen) days.   pindolol  (VISKEN ) 5 MG tablet Take 1 tablet by mouth twice daily   traMADol  (ULTRAM ) 50 MG tablet Take 1-2 tabs every 12 hours as needed for pain   triamcinolone  cream (KENALOG) 0.1 % Apply topically 2 (two) times daily as needed.   ZEPBOUND  15 MG/0.5ML Pen Inject 15 mg into the skin once a week.   Current Facility-Administered Medications for the 07/28/24 encounter (Office Visit) with Prentiss Frieze, DO  Medication   omalizumab  (XOLAIR ) prefilled syringe 300 mg  [2]  Allergies Allergen Reactions   Ciprofloxacin Other (See Comments)    Other Reaction(s): contraindication for EDS   Codeine     MAKES HER DIZZY AND DISORIENTED  Other Reaction(s): dizzy and disoriented   Semaglutide Other (See Comments)   "

## 2024-08-11 NOTE — Progress Notes (Signed)
 "    Patient Care Team: Prentiss Frieze, DO as PCP - General (Family Medicine) Shlomo Wilbert SAUNDERS, MD as PCP - Cardiology (Cardiology) Cathlyn JAYSON Nikki Bobie FORBES, MD as Consulting Physician (Obstetrics and Gynecology)  Weight Management:   Starting weight: 188 lbs.  Starting date: 03/20/2022.  Current weight: 137 lbs.  Weight change since last encounter: +2 lbs.  Weight change since first encounter: -51 lbs.  Total weight loss percentage: -28.72%.  Body Fat Percentage: 30.7%.   Body mass index is 22.11 kg/m.   Nutrition Plan: Trying to stay around 1200 calories, 100g protien and very low carb.  Adherence to Nutrition Plan: - excellent .  Current Anti-Obesity Medication, including off-label: Vyvanse  20mg  1 tab PO QD (takes 3-4 times per week)- No SE Zepbound  15MG  SQ QW (microdosing 35-40 units biweekly) SE: Constipation every now and again if she doesnt get enough protein, hairloss.  Recent Physical Activity: walking 2 1/2 for approx 40 minutes 3 days per week.   Sleep: Number of hours slept each night: 5. Sleep is not restful.   Diagnoses and Orders:   1. Mixed hyperlipidemia   2. Binge eating disorder in remission   3. Hx of obesity   4. Chronic pain syndrome   5. Normal weight with body mass index (BMI) of 22.0 to 22.9 in adult    Meds ordered this encounter  Medications   lisdexamfetamine  (VYVANSE ) 20 MG capsule    Sig: Take 1 capsule (20 mg total) by mouth daily as needed.    Dispense:  30 capsule    Refill:  0   traMADol  (ULTRAM ) 50 MG tablet    Sig: Take 1-2 tabs every 12 hours as needed for pain    Dispense:  180 tablet    Refill:  0   Orders Placed This Encounter  Procedures   Apolipoprotein B   Assessment & Plan:   Myrakle was seen today for medical management of chronic issues.  Diagnoses and all orders for this visit:  Mixed hyperlipidemia -     Apolipoprotein B  Lab Results  Component Value Date   CHOL 237 (H) 06/07/2024   HDL 53.50 06/07/2024    LDLCALC 150 (H) 06/07/2024   TRIG 167.0 (H) 06/07/2024   CHOLHDL 4 06/07/2024   Lab Results  Component Value Date   ALT 20 06/07/2024   AST 17 06/07/2024   ALKPHOS 33 (L) 06/07/2024   BILITOT 0.9 06/07/2024   Binge eating disorder in remission -     lisdexamfetamine  (VYVANSE ) 20 MG capsule; Take 1 capsule (20 mg total) by mouth daily as needed.  She will continue to focus on protein-rich, low simple carbohydrate foods. We reviewed the importance of hydration, regular exercise for stress reduction, and restorative sleep.   Chronic pain syndrome -     traMADol  (ULTRAM ) 50 MG tablet; Take 1-2 tabs every 12 hours as needed for pain  Reviewed recent visit with Dr. Joane. Will monitor.  Normal weight with body mass index (BMI) of 22.0 to 22.9 in adult   Frieze Prentiss, DO, MS (Nutrition), FAAFP, Dipl. ABOM Fellow, American Academy [employer] Diplomate, American Board of Obesity Medicine [facility] [address] [place], [place] [address] Dept: [phone] Fax: [phone]  Subjective:   Review of Systems: Negative, with the exception of above mentioned in HPI.  History:   Reviewed by clinician on day of visit: allergies, medications, problem list, medical history, surgical history, family history,  social history, and previous encounter notes.  Medications:   Show/hide medication list[1] Allergies[2]  Objective:   BP 120/64 (BP Location: Left Arm, Cuff Size: Normal)   Pulse 70   Ht 5' 6 (1.676 m)   Wt 137 lb (62.1 kg)   BMI 22.11 kg/m   Physical Exam Constitutional:      General: She is not in acute distress.    Appearance: She is well-developed.  HENT:     Head: Normocephalic and atraumatic.  Eyes:     Conjunctiva/sclera: Conjunctivae normal.  Cardiovascular:     Rate and Rhythm: Normal rate and regular rhythm.     Heart sounds: Normal heart sounds.  Pulmonary:     Effort: Pulmonary effort is  normal.     Breath sounds: Normal breath sounds.  Neurological:     General: No focal deficit present.     Mental Status: She is alert.  Psychiatric:        Behavior: Behavior normal.    Results for orders placed or performed in visit on 08/11/24  Apolipoprotein B   Collection Time: 08/11/24  3:49 PM  Result Value Ref Range   Apolipoprotein B 120 (H) <90 mg/dL    8/69/7973    EYV7-0 Depression Screening   Little interest or pleasure in doing things Not at all  Feeling down, depressed, or hopeless Not at all  PHQ-2 - Total Score 0  Trouble falling or staying asleep, or sleeping too much More than half the days  Feeling tired or having little energy More than half the days  Poor appetite or overeating  Not at all  Feeling bad about yourself - or that you are a failure or have let yourself or your family down Not at all  Trouble concentrating on things, such as reading the newspaper or watching television Not at all  Moving or speaking so slowly that other people could have noticed.  Or the opposite - being so fidgety or restless that you have been moving around a lot more than usual Not at all  Thoughts that you would be better off dead, or hurting yourself in some way Not at all  PHQ2-9 Total Score 4  If you checked off any problems, how difficult have these problems made it for you to do your work, take care of things at home, or get along with other people Very difficult  Depression Interventions/Treatment        08/11/2024    2:47 PM 06/07/2024    1:21 PM  GAD 7 : Generalized Anxiety Score  Nervous, Anxious, on Edge 0 0   Control/stop worrying 0 0   Worry too much - different things 0 0   Trouble relaxing 0 1   Restless 0 0   Easily annoyed or irritable  1   Afraid - awful might happen 0 0   Total GAD 7 Score  2  Anxiety Difficulty Not difficult at all Somewhat difficult     Data saved with a previous flowsheet row definition      [1]  Outpatient Medications Prior  to Visit  Medication Sig   B-D 3CC LUER-LOK SYR 25GX1 25G X 1 3 ML MISC To administer B12 injection q 2 weeks.   cyanocobalamin  (VITAMIN B12) 1000 MCG/ML injection Inject 1 mL (1,000 mcg total) into the skin every 14 (fourteen) days.   diclofenac  (VOLTAREN ) 75 MG EC tablet Take 1 tablet (75 mg total) by mouth 2 (two) times daily with a meal.  EPINEPHrine  (EPIPEN  2-PAK) 0.3 mg/0.3 mL IJ SOAJ injection Inject 0.3 mg into the muscle as needed for anaphylaxis.   levonorgestrel -ethinyl estradiol  (SEASONALE) 0.15-0.03 MG tablet Take 1 tablet by mouth daily. Patient is taking continuous contraception.   nortriptyline  (PAMELOR ) 25 MG capsule Take 1 capsule (25 mg total) by mouth at bedtime.   omalizumab  (XOLAIR ) 300 MG/2  ML prefilled syringe Inject 300 mg into the skin every 14 (fourteen) days.   pindolol  (VISKEN ) 5 MG tablet Take 1 tablet by mouth twice daily   triamcinolone  cream (KENALOG) 0.1 % Apply topically 2 (two) times daily as needed.   ZEPBOUND  15 MG/0.5ML Pen Inject 15 mg into the skin once a week.   [DISCONTINUED] lisdexamfetamine  (VYVANSE ) 20 MG capsule Take 1 capsule (20 mg total) by mouth daily as needed.   [DISCONTINUED] traMADol  (ULTRAM ) 50 MG tablet Take 1-2 tabs every 12 hours as needed for pain   Facility-Administered Medications Prior to Visit  Medication Dose Route Frequency Provider   omalizumab  (XOLAIR ) prefilled syringe 300 mg  300 mg Subcutaneous Q28 days Iva Marty Saltness, MD  [2]  Allergies Allergen Reactions   Ciprofloxacin Other (See Comments)    Other Reaction(s): contraindication for EDS   Codeine     MAKES HER DIZZY AND DISORIENTED  Other Reaction(s): dizzy and disoriented   Semaglutide Other (See Comments)   "

## 2024-08-12 ENCOUNTER — Encounter: Payer: Self-pay | Admitting: Family Medicine

## 2024-08-12 LAB — APOLIPOPROTEIN B: Apolipoprotein B: 120 mg/dL — ABNORMAL HIGH

## 2024-08-16 ENCOUNTER — Ambulatory Visit: Payer: Self-pay | Admitting: Family Medicine

## 2024-08-22 ENCOUNTER — Encounter: Admitting: Physical Therapy

## 2024-08-29 ENCOUNTER — Encounter: Admitting: Physical Therapy

## 2024-08-31 ENCOUNTER — Encounter: Admitting: Physical Therapy

## 2024-09-05 ENCOUNTER — Encounter: Admitting: Physical Therapy

## 2024-09-07 ENCOUNTER — Encounter: Admitting: Physical Therapy

## 2024-09-12 ENCOUNTER — Encounter: Admitting: Physical Therapy

## 2024-09-14 ENCOUNTER — Encounter: Admitting: Physical Therapy

## 2024-09-19 ENCOUNTER — Encounter: Admitting: Physical Therapy

## 2024-09-21 ENCOUNTER — Encounter: Admitting: Physical Therapy

## 2024-09-30 ENCOUNTER — Ambulatory Visit: Admitting: Family Medicine

## 2024-10-10 ENCOUNTER — Ambulatory Visit: Admitting: Family Medicine

## 2024-10-11 ENCOUNTER — Ambulatory Visit: Admitting: Obstetrics and Gynecology

## 2024-11-24 ENCOUNTER — Ambulatory Visit: Admitting: Allergy & Immunology
